# Patient Record
Sex: Male | Born: 1950 | Race: White | Hispanic: No | Marital: Married | Smoking: Never smoker
Health system: Southern US, Community
[De-identification: ages and names within clinical notes are randomized; demographics above are authoritative.]

## PROBLEM LIST (undated history)

## (undated) DIAGNOSIS — K4090 Unilateral inguinal hernia, without obstruction or gangrene, not specified as recurrent: Secondary | ICD-10-CM

## (undated) DIAGNOSIS — K429 Umbilical hernia without obstruction or gangrene: Secondary | ICD-10-CM

## (undated) DIAGNOSIS — J45909 Unspecified asthma, uncomplicated: Secondary | ICD-10-CM

## (undated) DIAGNOSIS — Z86010 Personal history of colonic polyps: Secondary | ICD-10-CM

## (undated) DIAGNOSIS — K922 Gastrointestinal hemorrhage, unspecified: Secondary | ICD-10-CM

## (undated) DIAGNOSIS — E0590 Thyrotoxicosis, unspecified without thyrotoxic crisis or storm: Secondary | ICD-10-CM

## (undated) DIAGNOSIS — T7840XA Allergy, unspecified, initial encounter: Secondary | ICD-10-CM

## (undated) DIAGNOSIS — R9431 Abnormal electrocardiogram [ECG] [EKG]: Secondary | ICD-10-CM

## (undated) DIAGNOSIS — J309 Allergic rhinitis, unspecified: Secondary | ICD-10-CM

## (undated) DIAGNOSIS — E785 Hyperlipidemia, unspecified: Secondary | ICD-10-CM

## (undated) DIAGNOSIS — K559 Vascular disorder of intestine, unspecified: Secondary | ICD-10-CM

## (undated) DIAGNOSIS — I2699 Other pulmonary embolism without acute cor pulmonale: Secondary | ICD-10-CM

## (undated) DIAGNOSIS — I10 Essential (primary) hypertension: Secondary | ICD-10-CM

## (undated) DIAGNOSIS — D6851 Activated protein C resistance: Secondary | ICD-10-CM

## (undated) DIAGNOSIS — Z860101 Personal history of adenomatous and serrated colon polyps: Secondary | ICD-10-CM

## (undated) HISTORY — DX: Other pulmonary embolism without acute cor pulmonale: I26.99

## (undated) HISTORY — DX: Essential (primary) hypertension: I10

## (undated) HISTORY — DX: Gastrointestinal hemorrhage, unspecified: K92.2

## (undated) HISTORY — DX: Activated protein C resistance: D68.51

## (undated) HISTORY — PX: COLONOSCOPY: SHX174

## (undated) HISTORY — DX: Allergy, unspecified, initial encounter: T78.40XA

## (undated) HISTORY — DX: Unspecified asthma, uncomplicated: J45.909

## (undated) HISTORY — DX: Unilateral inguinal hernia, without obstruction or gangrene, not specified as recurrent: K40.90

## (undated) HISTORY — DX: Vascular disorder of intestine, unspecified: K55.9

## (undated) HISTORY — PX: ACHILLES TENDON REPAIR: SUR1153

## (undated) HISTORY — DX: Abnormal electrocardiogram (ECG) (EKG): R94.31

## (undated) HISTORY — DX: Umbilical hernia without obstruction or gangrene: K42.9

## (undated) HISTORY — DX: Hyperlipidemia, unspecified: E78.5

## (undated) HISTORY — DX: Allergic rhinitis, unspecified: J30.9

---

## 1998-10-21 ENCOUNTER — Emergency Department (HOSPITAL_COMMUNITY): Admission: EM | Admit: 1998-10-21 | Discharge: 1998-10-21 | Payer: Self-pay | Admitting: Emergency Medicine

## 1998-10-24 ENCOUNTER — Ambulatory Visit (HOSPITAL_BASED_OUTPATIENT_CLINIC_OR_DEPARTMENT_OTHER): Admission: RE | Admit: 1998-10-24 | Discharge: 1998-10-24 | Payer: Self-pay | Admitting: Orthopaedic Surgery

## 2001-04-26 ENCOUNTER — Ambulatory Visit (HOSPITAL_BASED_OUTPATIENT_CLINIC_OR_DEPARTMENT_OTHER): Admission: RE | Admit: 2001-04-26 | Discharge: 2001-04-26 | Payer: Self-pay | Admitting: *Deleted

## 2002-02-08 ENCOUNTER — Ambulatory Visit (HOSPITAL_BASED_OUTPATIENT_CLINIC_OR_DEPARTMENT_OTHER): Admission: RE | Admit: 2002-02-08 | Discharge: 2002-02-08 | Payer: Self-pay | Admitting: *Deleted

## 2002-02-08 ENCOUNTER — Encounter (INDEPENDENT_AMBULATORY_CARE_PROVIDER_SITE_OTHER): Payer: Self-pay

## 2004-01-25 ENCOUNTER — Emergency Department (HOSPITAL_COMMUNITY): Admission: EM | Admit: 2004-01-25 | Discharge: 2004-01-25 | Payer: Self-pay | Admitting: Emergency Medicine

## 2005-09-07 ENCOUNTER — Encounter: Admission: RE | Admit: 2005-09-07 | Discharge: 2005-09-07 | Payer: Self-pay | Admitting: Family Medicine

## 2005-09-08 ENCOUNTER — Encounter: Admission: RE | Admit: 2005-09-08 | Discharge: 2005-09-08 | Payer: Self-pay | Admitting: Family Medicine

## 2010-05-16 ENCOUNTER — Inpatient Hospital Stay (HOSPITAL_COMMUNITY): Admission: RE | Admit: 2010-05-16 | Discharge: 2010-05-17 | Payer: Self-pay | Admitting: Family Medicine

## 2010-05-18 ENCOUNTER — Telehealth: Payer: Self-pay | Admitting: Internal Medicine

## 2010-05-21 ENCOUNTER — Ambulatory Visit: Payer: Self-pay | Admitting: Internal Medicine

## 2010-05-21 DIAGNOSIS — I2699 Other pulmonary embolism without acute cor pulmonale: Secondary | ICD-10-CM

## 2010-05-21 DIAGNOSIS — J309 Allergic rhinitis, unspecified: Secondary | ICD-10-CM | POA: Insufficient documentation

## 2010-05-21 DIAGNOSIS — J45909 Unspecified asthma, uncomplicated: Secondary | ICD-10-CM

## 2010-05-21 DIAGNOSIS — J4520 Mild intermittent asthma, uncomplicated: Secondary | ICD-10-CM | POA: Insufficient documentation

## 2010-05-21 DIAGNOSIS — I10 Essential (primary) hypertension: Secondary | ICD-10-CM | POA: Insufficient documentation

## 2010-05-21 DIAGNOSIS — Z86711 Personal history of pulmonary embolism: Secondary | ICD-10-CM | POA: Insufficient documentation

## 2010-05-21 HISTORY — DX: Unspecified asthma, uncomplicated: J45.909

## 2010-05-21 HISTORY — DX: Essential (primary) hypertension: I10

## 2010-05-21 HISTORY — DX: Allergic rhinitis, unspecified: J30.9

## 2010-05-21 HISTORY — DX: Other pulmonary embolism without acute cor pulmonale: I26.99

## 2010-05-26 ENCOUNTER — Ambulatory Visit: Payer: Self-pay | Admitting: Internal Medicine

## 2010-06-01 ENCOUNTER — Ambulatory Visit: Payer: Self-pay | Admitting: Internal Medicine

## 2010-06-01 LAB — CONVERTED CEMR LAB

## 2010-06-15 ENCOUNTER — Ambulatory Visit: Payer: Self-pay | Admitting: Internal Medicine

## 2010-07-01 ENCOUNTER — Ambulatory Visit: Payer: Self-pay | Admitting: Internal Medicine

## 2010-07-02 ENCOUNTER — Encounter: Payer: Self-pay | Admitting: Internal Medicine

## 2010-07-03 ENCOUNTER — Encounter (INDEPENDENT_AMBULATORY_CARE_PROVIDER_SITE_OTHER): Payer: Self-pay | Admitting: *Deleted

## 2010-07-20 ENCOUNTER — Ambulatory Visit: Payer: Self-pay | Admitting: Internal Medicine

## 2010-07-24 ENCOUNTER — Ambulatory Visit: Payer: Self-pay | Admitting: Internal Medicine

## 2010-08-26 ENCOUNTER — Ambulatory Visit: Payer: Self-pay | Admitting: Internal Medicine

## 2010-08-26 LAB — CONVERTED CEMR LAB: INR: 2.2

## 2010-09-23 ENCOUNTER — Ambulatory Visit: Payer: Self-pay | Admitting: Internal Medicine

## 2010-09-23 LAB — CONVERTED CEMR LAB: INR: 2.6

## 2010-10-23 ENCOUNTER — Ambulatory Visit: Payer: Self-pay | Admitting: Internal Medicine

## 2010-11-17 ENCOUNTER — Encounter (INDEPENDENT_AMBULATORY_CARE_PROVIDER_SITE_OTHER): Payer: Self-pay | Admitting: *Deleted

## 2010-11-17 ENCOUNTER — Ambulatory Visit: Payer: Self-pay | Admitting: Internal Medicine

## 2010-11-17 LAB — CONVERTED CEMR LAB: INR: 2.1

## 2010-12-04 ENCOUNTER — Telehealth: Payer: Self-pay | Admitting: Internal Medicine

## 2010-12-04 ENCOUNTER — Telehealth (INDEPENDENT_AMBULATORY_CARE_PROVIDER_SITE_OTHER): Payer: Self-pay | Admitting: *Deleted

## 2010-12-08 ENCOUNTER — Encounter (INDEPENDENT_AMBULATORY_CARE_PROVIDER_SITE_OTHER): Payer: Self-pay | Admitting: *Deleted

## 2010-12-10 ENCOUNTER — Ambulatory Visit: Payer: Self-pay | Admitting: Internal Medicine

## 2010-12-31 ENCOUNTER — Ambulatory Visit
Admission: RE | Admit: 2010-12-31 | Discharge: 2010-12-31 | Payer: Self-pay | Source: Home / Self Care | Attending: Internal Medicine | Admitting: Internal Medicine

## 2010-12-31 ENCOUNTER — Encounter: Payer: Self-pay | Admitting: Internal Medicine

## 2011-01-02 ENCOUNTER — Encounter: Payer: Self-pay | Admitting: Internal Medicine

## 2011-01-28 NOTE — Assessment & Plan Note (Signed)
Summary: CONSULTATION CONCERNING COUMADIN THERAPY // RS   Vital Signs:  Patient profile:   60 year old male Weight:      229 pounds Temp:     97.6 degrees F oral BP sitting:   132 / 90  (right arm) Cuff size:   regular  Vitals Entered By: Duard Brady LPN (July 24, 2010 8:05 AM) CC: needs to discuss up coming dental procedure and colo to be done - coumadin tx Is Patient Diabetic? No   CC:  needs to discuss up coming dental procedure and colo to be done - coumadin tx.  History of Present Illness: 59 year old patient who has a history of pulmonary embolism in May of this year.  He has a history of factor V Leiden deficiency.  He needs elective wisdom teeth, extraction, as well as screening colonoscopy.  Options were discussed, and he was elected to hold off the screening procedures until he has completed 6 months of Coumadin.  He has hypertension, which has been well controlled  Preventive Screening-Counseling & Management  Alcohol-Tobacco     Smoking Status: never  Allergies (verified): No Known Drug Allergies  Review of Systems       The patient complains of peripheral edema.  The patient denies anorexia, fever, weight loss, weight gain, vision loss, decreased hearing, hoarseness, chest pain, syncope, dyspnea on exertion, prolonged cough, headaches, hemoptysis, abdominal pain, melena, hematochezia, severe indigestion/heartburn, hematuria, incontinence, genital sores, muscle weakness, suspicious skin lesions, transient blindness, difficulty walking, depression, unusual weight change, abnormal bleeding, enlarged lymph nodes, angioedema, breast masses, and testicular masses.    Physical Exam  General:  overweight-appearing.  130/84 Head:  Normocephalic and atraumatic without obvious abnormalities. No apparent alopecia or balding. Mouth:  Oral mucosa and oropharynx without lesions or exudates.  Teeth in good repair. Neck:  No deformities, masses, or tenderness noted. Lungs:   Normal respiratory effort, chest expands symmetrically. Lungs are clear to auscultation, no crackles or wheezes. Heart:  Normal rate and regular rhythm. S1 and S2 normal without gallop, murmur, click, rub or other extra sounds. Abdomen:  Bowel sounds positive,abdomen soft and non-tender without masses, organomegaly or hernias noted. Msk:  No deformity or scoliosis noted of thoracic or lumbar spine.   Pulses:  R and L carotid,radial,femoral,dorsalis pedis and posterior tibial pulses are full and equal bilaterally Extremities:  No clubbing, cyanosis, edema, or deformity noted with normal full range of motion of all joints.     Impression & Recommendations:  Problem # 1:  COUMADIN THERAPY (ICD-V58.61) will hold off on colonoscopy, wisdom tooth extraction until off Coumadin in 4 months.  Will set up for a screening colonoscopy at that time  Problem # 2:  PULMONARY EMBOLISM (ICD-415.19)  His updated medication list for this problem includes:    Lovenox 120 Mg/0.74ml Soln (Enoxaparin sodium) .Marland KitchenMarland KitchenMarland KitchenMarland Kitchen 105 mg - 0.37ml    bid    Coumadin 7.5 Mg Tabs (Warfarin sodium) ..... Qd    Coumadin 5 Mg Tabs (Warfarin sodium) ..... One daily  Problem # 3:  HYPERTENSION (ICD-401.9)  His updated medication list for this problem includes:    Ramipril 10 Mg Caps (Ramipril) ..... Qd    Amlodipine Besylate 5 Mg Tabs (Amlodipine besylate) ..... Qd  Complete Medication List: 1)  Ramipril 10 Mg Caps (Ramipril) .... Qd 2)  Amlodipine Besylate 5 Mg Tabs (Amlodipine besylate) .... Qd 3)  Doxycycline Hyclate 100 Mg Caps (Doxycycline hyclate) .... Qd 4)  Singulair 10 Mg Tabs (Montelukast sodium) .... Qd 5)  Ventolin Hfa 108 (90 Base) Mcg/act Aers (Albuterol sulfate) .... Prn 6)  Lovenox 120 Mg/0.12ml Soln (Enoxaparin sodium) .Marland Kitchen.. 105 mg - 0.19ml    bid 7)  Coumadin 7.5 Mg Tabs (Warfarin sodium) .... Qd 8)  Caltrate 600+d 600-400 Mg-unit Tabs (Calcium carbonate-vitamin d) .... Qd 9)  Daily Multi Tabs (Multiple  vitamins-minerals) .... Qd 10)  Fish Oil 1000 Mg Caps (Omega-3 fatty acids) .... Qd 11)  Coumadin 5 Mg Tabs (Warfarin sodium) .... One daily  Patient Instructions: 1)  Please schedule a follow-up appointment in 4 months. 2)  Limit your Sodium (Salt) to less than 2 grams a day(slightly less than 1/2 a teaspoon) to prevent fluid retention, swelling, or worsening of symptoms. 3)  It is important that you exercise regularly at least 20 minutes 5 times a week. If you develop chest pain, have severe difficulty breathing, or feel very tired , stop exercising immediately and seek medical attention. 4)  You need to lose weight. Consider a lower calorie diet and regular exercise.  5)  Check your Blood Pressure regularly. If it is above: 150/90  you should make an appointment.

## 2011-01-28 NOTE — Assessment & Plan Note (Signed)
Summary: pt/inr//ccm  Nurse Visit   Allergies: No Known Drug Allergies Laboratory Results   Blood Tests   Date/Time Received: June 01, 2010 11:55 AM  Date/Time Reported: June 01, 2010 11:55 AM    INR: 3.3   (Normal Range: 0.88-1.12   Therap INR: 2.0-3.5) Comments: Wynona Canes, CMA  June 01, 2010 11:55 AM     Orders Added: 1)  Est. Patient Level I [99211] 2)  Protime [16109UE]  Laboratory Results   Blood Tests      INR: 3.3   (Normal Range: 0.88-1.12   Therap INR: 2.0-3.5) Comments: Wynona Canes, CMA  June 01, 2010 11:55 AM       ANTICOAGULATION RECORD PREVIOUS REGIMEN & LAB RESULTS Anticoagulation Diagnosis:  V58.83,V58.61,415.19 on  05/21/2010 Previous INR Goal Range:  2.0-3.0 on  05/21/2010 Previous INR:  2.7 on  05/26/2010 Previous Coumadin Dose(mg):  7.5mg  qd on  05/21/2010 Previous Regimen:  same on  05/26/2010  NEW REGIMEN & LAB RESULTS Current INR Goal Range: 2.0-3.0 Current INR: 3.3 Regimen: Hold for 1 day then 5mg  on mon & thu 7.5mg  on other days       Repeat testing in: 2 weeks MEDICATIONS RAMIPRIL 10 MG CAPS (RAMIPRIL) qd AMLODIPINE BESYLATE 5 MG TABS (AMLODIPINE BESYLATE) qd DOXYCYCLINE HYCLATE 100 MG CAPS (DOXYCYCLINE HYCLATE) qd SINGULAIR 10 MG TABS (MONTELUKAST SODIUM) qd VENTOLIN HFA 108 (90 BASE) MCG/ACT AERS (ALBUTEROL SULFATE) prn LOVENOX 120 MG/0.8ML SOLN (ENOXAPARIN SODIUM) 105 mg - 0.80ml    bid COUMADIN 7.5 MG TABS (WARFARIN SODIUM) qd CALTRATE 600+D 600-400 MG-UNIT TABS (CALCIUM CARBONATE-VITAMIN D) qd DAILY MULTI  TABS (MULTIPLE VITAMINS-MINERALS) qd FISH OIL 1000 MG CAPS (OMEGA-3 FATTY ACIDS) qd COUMADIN 5 MG TABS (WARFARIN SODIUM) one daily   Anticoagulation Visit Questionnaire      Coumadin dose missed/changed:  No      Abnormal Bleeding Symptoms:  No   Any diet changes including alcohol intake, vegetables or greens since the last visit:  No Any illnesses or hospitalizations since the last visit:  No Any  signs of clotting since the last visit (including chest discomfort, dizziness, shortness of breath, arm tingling, slurred speech, swelling or redness in leg):  No

## 2011-01-28 NOTE — Progress Notes (Signed)
Summary: Medication List provided by patient  Medication List provided by patient   Imported By: Maryln Gottron 05/27/2010 15:21:52  _____________________________________________________________________  External Attachment:    Type:   Image     Comment:   External Document

## 2011-01-28 NOTE — Procedures (Signed)
Summary: Colonoscopy  Patient: Matthew Trujillo Note: All result statuses are Final unless otherwise noted.  Tests: (1) Colonoscopy (COL)   COL Colonoscopy           DONE     [facility]     [address]     [place], [place]  [address]           COLONOSCOPY PROCEDURE REPORT           PATIENT:  Matthew Trujillo, Matthew Trujillo  MR#:  [id number]     BIRTHDATE:  04-Nov-1951, 59 yrs. old  GENDER:  male     ENDOSCOPIST:  Wilhemina Bonito. Eda Keys, MD     REF. BY:  .Direct / Self     PROCEDURE DATE:  12/31/2010     PROCEDURE:  Colonoscopy with snare polypectomy x 1     ASA CLASS:  Class II     INDICATIONS:  Routine Risk Screening     MEDICATIONS:   Fentanyl 100 mcg IV, Versed 10 mg IV           DESCRIPTION OF PROCEDURE:   After the risks benefits and     alternatives of the procedure were thoroughly explained, informed     consent was obtained.  Digital rectal exam was performed and     revealed no abnormalities.   The LB [id number] endoscope     was introduced through the anus and advanced to the cecum, which     was identified by both the appendix and ileocecal valve, without     limitations.Time to cecum = 2:41 min.  The quality of the prep was     excellent, using MoviPrep.  The instrument was then slowly     withdrawn (time = 13:36 min) as the colon was fully examined.     <<PROCEDUREIMAGES>>           FINDINGS:  A diminutive polyp was found in the cecum. Polyp was     snared without cautery. Retrieval was successful.   Otherwise     normal colonoscopy without other polyps, masses, vascular     ectasias, or inflammatory changes.   Retroflexed views in the     rectum revealed no abnormalities.    The scope was then withdrawn     from the patient and the procedure completed.           COMPLICATIONS:  None     ENDOSCOPIC IMPRESSION:     1) Diminutive polyp in the cecum - removed     2) Otherwise normal colonoscopy           RECOMMENDATIONS:     1) Repeat colonoscopy in 5 years if polyp  adenomatous; otherwise     10 years           ______________________________     Wilhemina Bonito. Eda Keys, MD           CC:  Gordy Savers, MD; The Patient           n.     eSIGNED:   Wilhemina Bonito. Eda Keys at 12/31/2010 12:12 PM           Sheliah Mends, [id number]  Note: An exclamation mark (!) indicates a result that was not dispersed into the flowsheet. Document Creation Date: 12/31/2010 12:13 PM _______________________________________________________________________  (1) Order result status: Final Collection or observation date-time: 12/31/2010 12:07 Requested date-time:  Receipt date-time:  Reported date-time:  Referring Physician:   Ordering  Physician: Fransico Setters [id number]) Specimen Source:  Source: Launa Grill Order Number: [id number] Lab site:   Appended Document: Colonoscopy recall 5 yrs     Procedures Next Due Date:    Colonoscopy: 12/2015

## 2011-01-28 NOTE — Letter (Signed)
Summary: Request for Surgical Clearance/[facility]  Request for Surgical Clearance/[facility]   Imported By: Maryln Gottron 07/13/2010 14:27:03  _____________________________________________________________________  External Attachment:    Type:   Image     Comment:   External Document

## 2011-01-28 NOTE — Assessment & Plan Note (Signed)
Summary: BRAND NEW PT/TO EST/CJR   Vital Signs:  Patient profile:   60 year old male Height:      73.25 inches Weight:      230 pounds BMI:     30.25 Temp:     98.0 degrees F oral BP sitting:   160 / 90  (left arm) Cuff size:   regular  Vitals Entered By: Duard Brady LPN (May 21, 2010 2:41 PM) CC: new to establish - post hospital Is Patient Diabetic? No   CC:  new to establish - post hospital.  History of Present Illness:  60 year old patient recently discharged for bilateral small pulmonary emboli.  He has a history of factor V Leiden deficiency and a son, who is on chronic Coumadin due to factor V deficiency and recurrent thromboemboli.  He has done well since his hospital discharge and is on Lovenox 110 mg twice daily.  He is on Coumadin 7.5 mg daily.  Denies any pulmonary complaints  Preventive Screening-Counseling & Management  Alcohol-Tobacco     Smoking Status: never  Allergies (verified): No Known Drug Allergies  Past History:  Past Medical History: history of pulmonary emboli, May 2011 factor V Leiden deficiency Asthma Hypertension Allergic rhinitis  Past Surgical History: Achilles' tendon repair in 2000  Family History: Reviewed history and no changes required. positive for coronary artery disease, and hypertension one brother died of pancreatic cancer father died of renal cancer  Social History: Reviewed history and no changes required. Ph.D., Emergency planning/management officer for [employer] Married Never Smoked Smoking Status:  never  Review of Systems  The patient denies anorexia, fever, weight loss, weight gain, vision loss, decreased hearing, hoarseness, chest pain, syncope, dyspnea on exertion, peripheral edema, prolonged cough, headaches, hemoptysis, abdominal pain, melena, hematochezia, severe indigestion/heartburn, hematuria, incontinence, genital sores, muscle weakness, suspicious skin lesions, transient blindness, difficulty walking, depression,  unusual weight change, abnormal bleeding, enlarged lymph nodes, angioedema, breast masses, and testicular masses.    Physical Exam  General:  overweight-appearing.  140/90overweight-appearing.   Head:  Normocephalic and atraumatic without obvious abnormalities. No apparent alopecia or balding. Eyes:  No corneal or conjunctival inflammation noted. EOMI. Perrla. Funduscopic exam benign, without hemorrhages, exudates or papilledema. Vision grossly normal. Ears:  External ear exam shows no significant lesions or deformities.  Otoscopic examination reveals clear canals, tympanic membranes are intact bilaterally without bulging, retraction, inflammation or discharge. Hearing is grossly normal bilaterally. Nose:  External nasal examination shows no deformity or inflammation. Nasal mucosa are pink and moist without lesions or exudates. Mouth:  Oral mucosa and oropharynx without lesions or exudates.  Teeth in good repair. Neck:  No deformities, masses, or tenderness noted. Chest Wall:  No deformities, masses, tenderness or gynecomastia noted. Breasts:  No masses or gynecomastia noted Lungs:  Normal respiratory effort, chest expands symmetrically. Lungs are clear to auscultation, no crackles or wheezes. Heart:  Normal rate and regular rhythm. S1 and S2 normal without gallop, murmur, click, rub or other extra sounds. Abdomen:  Bowel sounds positive,abdomen soft and non-tender without masses, organomegaly or hernias noted. Msk:  No deformity or scoliosis noted of thoracic or lumbar spine.   Pulses:  R and L carotid,radial,femoral,dorsalis pedis and posterior tibial pulses are full and equal bilaterally Extremities:  No clubbing, cyanosis, edema, or deformity noted with normal full range of motion of all joints.   Skin:  Intact without suspicious lesions or rashes Cervical Nodes:  No lymphadenopathy noted Axillary Nodes:  No palpable lymphadenopathy Inguinal Nodes:  No significant adenopathy  Impression  & Recommendations:  Problem # 1:  Preventive Health Care (ICD-V70.0)  Complete Medication List: 1)  Ramipril 10 Mg Caps (Ramipril) .... Qd 2)  Amlodipine Besylate 5 Mg Tabs (Amlodipine besylate) .... Qd 3)  Doxycycline Hyclate 100 Mg Caps (Doxycycline hyclate) .... Qd 4)  Singulair 10 Mg Tabs (Montelukast sodium) .... Qd 5)  Ventolin Hfa 108 (90 Base) Mcg/act Aers (Albuterol sulfate) .... Prn 6)  Lovenox 120 Mg/0.95ml Soln (Enoxaparin sodium) .Marland Kitchen.. 105 mg - 0.35ml    bid 7)  Coumadin 7.5 Mg Tabs (Warfarin sodium) .... Qd 8)  Caltrate 600+d 600-400 Mg-unit Tabs (Calcium carbonate-vitamin d) .... Qd 9)  Daily Multi Tabs (Multiple vitamins-minerals) .... Qd 10)  Fish Oil 1000 Mg Caps (Omega-3 fatty acids) .... Qd 11)  Coumadin 5 Mg Tabs (Warfarin sodium) .... One daily  Other Orders: Protime ([id number])  Patient Instructions: 1)  Limit your Sodium (Salt) to less than 2 grams a day(slightly less than 1/2 a teaspoon) to prevent fluid retention, swelling, or worsening of symptoms. 2)  It is important that you exercise regularly at least 20 minutes 5 times a week. If you develop chest pain, have severe difficulty breathing, or feel very tired , stop exercising immediately and seek medical attention. 3)  You need to lose weight. Consider a lower calorie diet and regular exercise.  4)  Check your Blood Pressure regularly. If it is above: 150/90 you should make an appointment. Prescriptions: COUMADIN 5 MG TABS (WARFARIN SODIUM) one daily  #30 x 0   Entered and Authorized by:   Gordy Savers  MD   Signed by:   Gordy Savers  MD on 05/21/2010   Method used:   Electronically to        [id number] (retail)       [address]       [place], [place]  [address]       Ph: [phone] or [phone]       Fax: [phone]   RxID:   [id number] COUMADIN 7.5 MG TABS (WARFARIN SODIUM) qd  #30 x 0   Entered and Authorized by:   Gordy Savers  MD   Signed by:   Gordy Savers  MD on 05/21/2010   Method used:   Electronically to        [facility] (retail)       [address]       [place], [place]  [address]       Ph: [phone] or [phone]       Fax: [phone]   RxID:   [id number] VENTOLIN HFA 108 (90 BASE) MCG/ACT AERS (ALBUTEROL SULFATE) prn  #1 x 6   Entered and Authorized by:   Gordy Savers  MD   Signed by:   Gordy Savers  MD on 05/21/2010   Method used:   Electronically to        [facility] (retail)       [address]       [place], [place]  [address]       Ph: [phone] or [phone]       Fax: [phone]   RxID:   [id number] SINGULAIR 10 MG TABS (MONTELUKAST SODIUM) qd  #90 x 6   Entered and Authorized by:   Gordy Savers  MD   Signed by:   Gordy Savers  MD on 05/21/2010   Method used:   Electronically to  [facility] (retail)       [address]       [place], [place]  [address]       Ph: [phone] or [phone]       Fax: [phone]   RxID:   [id number] AMLODIPINE BESYLATE 5 MG TABS (AMLODIPINE BESYLATE) qd  #90 x 6   Entered and Authorized by:   Gordy Savers  MD   Signed by:   Gordy Savers  MD on 05/21/2010   Method used:   Electronically to        [facility] (retail)       [address]       [place], [place]  [address]       Ph: [phone] or [phone]       Fax: [phone]   RxID:   [id number] RAMIPRIL 10 MG CAPS (RAMIPRIL) qd  #90 x 6   Entered and Authorized by:   Gordy Savers  MD   Signed by:   Gordy Savers  MD on 05/21/2010   Method used:   Electronically to        [facility] (retail)       [address]       [place], [place]  [address]       Ph: [phone] or [phone]       Fax: [phone]   RxID:   [id number]   Appended Document: Orders Update    Clinical Lists Changes  Observations: Added new observation of ABNORM BLEED: No (05/21/2010 16:56) Added new  observation of COUMADIN CHG: No (05/21/2010 16:56) Added new observation of NEXT PT: Tuesday (05/21/2010 16:56) Added new observation of CUR. REGIMEN: 7.5mg  qd (05/21/2010 16:56) Added new observation of COUM TAB MG: 7.5mg  qd (05/21/2010 16:56) Added new observation of INR RANGE: 2.0-3.0 (05/21/2010 16:56) Added new observation of DIAGNOSIS C1: V58.83,V58.61,415.19 (05/21/2010 16:56) Added new observation of COMMENTS2: Wynona Canes, CMA  May 21, 2010 4:56 PM  (05/21/2010 16:56) Added new observation of INR: 1.5  (05/21/2010 16:56) Added new observation of PT PATIENT: 14.0 s (05/21/2010 16:56)      Laboratory Results   Blood Tests   Date/Time Recieved: May 21, 2010 4:56 PM  Date/Time Reported: May 21, 2010 4:56 PM   PT: 14.0 s   (Normal Range: 10.6-13.4)  INR: 1.5   (Normal Range: 0.88-1.12   Therap INR: 2.0-3.5) Comments: Wynona Canes, CMA  May 21, 2010 4:56 PM       ANTICOAGULATION RECORD       NEW REGIMEN & LAB RESULTS Anticoag. Dx: V58.83,V58.61,415.19 Current INR Goal Range: 2.0-3.0 Current INR: 1.5 Current Coumadin Dose(mg): 7.5mg  qd Regimen: 7.5mg  qd       Repeat testing in: Tuesday MEDICATIONS RAMIPRIL 10 MG CAPS (RAMIPRIL) qd AMLODIPINE BESYLATE 5 MG TABS (AMLODIPINE BESYLATE) qd DOXYCYCLINE HYCLATE 100 MG CAPS (DOXYCYCLINE HYCLATE) qd SINGULAIR 10 MG TABS (MONTELUKAST SODIUM) qd VENTOLIN HFA 108 (90 BASE) MCG/ACT AERS (ALBUTEROL SULFATE) prn LOVENOX 120 MG/0.8ML SOLN (ENOXAPARIN SODIUM) 105 mg - 0.52ml    bid COUMADIN 7.5 MG TABS (WARFARIN SODIUM) qd CALTRATE 600+D 600-400 MG-UNIT TABS (CALCIUM CARBONATE-VITAMIN D) qd DAILY MULTI  TABS (MULTIPLE VITAMINS-MINERALS) qd FISH OIL 1000 MG CAPS (OMEGA-3 FATTY ACIDS) qd COUMADIN 5 MG TABS (WARFARIN SODIUM) one daily   Anticoagulation Visit Questionnaire      Coumadin dose missed/changed:  No      Abnormal Bleeding Symptoms:  No   Any diet changes  including alcohol intake, vegetables or greens  since the last visit:  No Any illnesses or hospitalizations since the last visit:  No Any signs of clotting since the last visit (including chest discomfort, dizziness, shortness of breath, arm tingling, slurred speech, swelling or redness in leg):  No

## 2011-01-28 NOTE — Assessment & Plan Note (Signed)
Summary: pt/njr/pt rescd//ccm  Nurse Visit   Allergies: No Known Drug Allergies Laboratory Results   Blood Tests   Date/Time Received: October 23, 2010 11:41 AM  Date/Time Reported: October 23, 2010 11:41 AM    INR: 1.9   (Normal Range: 0.88-1.12   Therap INR: 2.0-3.5) Comments: Wynona Canes, CMA  October 23, 2010 11:41 AM     Orders Added: 1)  Est. Patient Level I [99211] 2)  Protime [16109UE]  Laboratory Results   Blood Tests      INR: 1.9   (Normal Range: 0.88-1.12   Therap INR: 2.0-3.5) Comments: Wynona Canes, CMA  October 23, 2010 11:41 AM       ANTICOAGULATION RECORD PREVIOUS REGIMEN & LAB RESULTS Anticoagulation Diagnosis:  V58.83,V58.61,415.19 on  05/21/2010 Previous INR Goal Range:  2.0-3.0 on  06/01/2010 Previous INR:  2.6 on  09/23/2010 Previous Coumadin Dose(mg):  5mg  only on sun 7.5mg  on other days on  08/26/2010 Previous Regimen:  Same Dose on  09/23/2010  NEW REGIMEN & LAB RESULTS Current INR: 1.9 Regimen: Same Dose  (no change)       Repeat testing in: 4 weeks MEDICATIONS RAMIPRIL 10 MG CAPS (RAMIPRIL) qd AMLODIPINE BESYLATE 5 MG TABS (AMLODIPINE BESYLATE) qd DOXYCYCLINE HYCLATE 100 MG CAPS (DOXYCYCLINE HYCLATE) qd SINGULAIR 10 MG TABS (MONTELUKAST SODIUM) qd VENTOLIN HFA 108 (90 BASE) MCG/ACT AERS (ALBUTEROL SULFATE) prn LOVENOX 120 MG/0.8ML SOLN (ENOXAPARIN SODIUM) 105 mg - 0.83ml    bid COUMADIN 7.5 MG TABS (WARFARIN SODIUM) qd CALTRATE 600+D 600-400 MG-UNIT TABS (CALCIUM CARBONATE-VITAMIN D) qd DAILY MULTI  TABS (MULTIPLE VITAMINS-MINERALS) qd FISH OIL 1000 MG CAPS (OMEGA-3 FATTY ACIDS) qd COUMADIN 5 MG TABS (WARFARIN SODIUM) one daily   Anticoagulation Visit Questionnaire      Coumadin dose missed/changed:  No      Abnormal Bleeding Symptoms:  No   Any diet changes including alcohol intake, vegetables or greens since the last visit:  No Any illnesses or hospitalizations since the last visit:  No Any signs of clotting  since the last visit (including chest discomfort, dizziness, shortness of breath, arm tingling, slurred speech, swelling or redness in leg):  No

## 2011-01-28 NOTE — Assessment & Plan Note (Signed)
Summary: PT/NJR  Nurse Visit   Allergies: No Known Drug Allergies Laboratory Results   Blood Tests   Date/Time Received: September 23, 2010 11:17 AM  Date/Time Reported: September 23, 2010 11:16 AM    INR: 2.6   (Normal Range: 0.88-1.12   Therap INR: 2.0-3.5) Comments: Wynona Canes, CMA  September 23, 2010 11:17 AM     Orders Added: 1)  Est. Patient Level I [99211] 2)  Fingerstick [04540]  Laboratory Results   Blood Tests      INR: 2.6   (Normal Range: 0.88-1.12   Therap INR: 2.0-3.5) Comments: Wynona Canes, CMA  September 23, 2010 11:17 AM       ANTICOAGULATION RECORD PREVIOUS REGIMEN & LAB RESULTS Anticoagulation Diagnosis:  V58.83,V58.61,415.19 on  05/21/2010 Previous INR Goal Range:  2.0-3.0 on  06/01/2010 Previous INR:  2.2 on  08/26/2010 Previous Coumadin Dose(mg):  5mg  only on sun 7.5mg  on other days on  08/26/2010 Previous Regimen:  5mg . Sun. only 7.5mg . all other days on  07/20/2010  NEW REGIMEN & LAB RESULTS Current INR: 2.6 Regimen: Same Dose       Repeat testing in: 4 weeks MEDICATIONS RAMIPRIL 10 MG CAPS (RAMIPRIL) qd AMLODIPINE BESYLATE 5 MG TABS (AMLODIPINE BESYLATE) qd DOXYCYCLINE HYCLATE 100 MG CAPS (DOXYCYCLINE HYCLATE) qd SINGULAIR 10 MG TABS (MONTELUKAST SODIUM) qd VENTOLIN HFA 108 (90 BASE) MCG/ACT AERS (ALBUTEROL SULFATE) prn LOVENOX 120 MG/0.8ML SOLN (ENOXAPARIN SODIUM) 105 mg - 0.68ml    bid COUMADIN 7.5 MG TABS (WARFARIN SODIUM) qd CALTRATE 600+D 600-400 MG-UNIT TABS (CALCIUM CARBONATE-VITAMIN D) qd DAILY MULTI  TABS (MULTIPLE VITAMINS-MINERALS) qd FISH OIL 1000 MG CAPS (OMEGA-3 FATTY ACIDS) qd COUMADIN 5 MG TABS (WARFARIN SODIUM) one daily   Anticoagulation Visit Questionnaire      Coumadin dose missed/changed:  No      Abnormal Bleeding Symptoms:  No   Any diet changes including alcohol intake, vegetables or greens since the last visit:  No Any illnesses or hospitalizations since the last visit:  No Any signs of  clotting since the last visit (including chest discomfort, dizziness, shortness of breath, arm tingling, slurred speech, swelling or redness in leg):  No

## 2011-01-28 NOTE — Assessment & Plan Note (Signed)
Summary: pt//ccm  Nurse Visit   Allergies: No Known Drug Allergies Laboratory Results   Blood Tests      INR: 1.8   (Normal Range: 0.88-1.12   Therap INR: 2.0-3.5) Comments: Rita Ohara  July 20, 2010 11:19 AM     Orders Added: 1)  Est. Patient Level I [99211] 2)  Protime [[id number]]   ANTICOAGULATION RECORD PREVIOUS REGIMEN & LAB RESULTS Anticoagulation Diagnosis:  V58.83,V58.61,415.19 on  05/21/2010 Previous INR Goal Range:  2.0-3.0 on  06/01/2010 Previous INR:  1.9 on  07/01/2010 Previous Coumadin Dose(mg):  5mg  on mon & thu 7.5mg  on other days on  07/01/2010 Previous Regimen:  same on  06/15/2010  NEW REGIMEN & LAB RESULTS Current INR: 1.8 Regimen: 5mg . Sun. only 7.5mg . all other days  Repeat testing in: 4 weeks  Anticoagulation Visit Questionnaire Coumadin dose missed/changed:  No Abnormal Bleeding Symptoms:  No  Any diet changes including alcohol intake, vegetables or greens since the last visit:  No Any illnesses or hospitalizations since the last visit:  No Any signs of clotting since the last visit (including chest discomfort, dizziness, shortness of breath, arm tingling, slurred speech, swelling or redness in leg):  No  MEDICATIONS RAMIPRIL 10 MG CAPS (RAMIPRIL) qd AMLODIPINE BESYLATE 5 MG TABS (AMLODIPINE BESYLATE) qd DOXYCYCLINE HYCLATE 100 MG CAPS (DOXYCYCLINE HYCLATE) qd SINGULAIR 10 MG TABS (MONTELUKAST SODIUM) qd VENTOLIN HFA 108 (90 BASE) MCG/ACT AERS (ALBUTEROL SULFATE) prn LOVENOX 120 MG/0.8ML SOLN (ENOXAPARIN SODIUM) 105 mg - 0.71ml    bid COUMADIN 7.5 MG TABS (WARFARIN SODIUM) qd CALTRATE 600+D 600-400 MG-UNIT TABS (CALCIUM CARBONATE-VITAMIN D) qd DAILY MULTI  TABS (MULTIPLE VITAMINS-MINERALS) qd FISH OIL 1000 MG CAPS (OMEGA-3 FATTY ACIDS) qd COUMADIN 5 MG TABS (WARFARIN SODIUM) one daily

## 2011-01-28 NOTE — Progress Notes (Signed)
Summary: PT/INR f/u past hosp.   Phone Note From Other Clinic   Caller: mary   - [facility] - Summary of Call: calling about Pt/inr - seen in hospital over weekend - no coumadon since saturday , on lovenox  current pt 10.6  INR 1.0, please advise. KIK Initial call taken by: Duard Brady LPN,  May 18, 2010 2:56 PM  Follow-up for Phone Call        per Dr. Amador Cunas - counadin 7.5 mg daily and to see in our office Thursday  - called mary [facility] and gave orders - appt made. KIK Follow-up by: Duard Brady LPN,  May 18, 2010 2:57 PM

## 2011-01-28 NOTE — Progress Notes (Signed)
Summary: Pt. on coumadin  Phone Note Call from Patient   Details for Reason: Pt. on coumadin Details of Complaint: needs office visit prior to scheduling colon Summary of Call: Pt. was on schedule for PV and Colon.  On reviewing his chart I saw he is on coumadin.  PV and colon cancelled.  Messages left by [facility] early this morning and by me this afternoon explaining why he needs an OV and that his procedure has been cx'd and his PV. Initial call taken by: Wyona Almas RN,  December 04, 2010 2:52 PM

## 2011-01-28 NOTE — Letter (Signed)
Summary: New Patient letter  [facility]  [address] [place], [place] [address]   Phone: [phone]  Fax: [phone]       07/03/2010 MRN: [id number]  [facility] [address] [place], [place]  [address]  Dear Mr. Giel,  Welcome to the Gastroenterology Division at [facility].    You are scheduled to see Dr.  Marina Goodell on 08-18-10 at 11:00a.m. on the 3rd floor at [facility], [address].  We ask that you try to arrive at our office 15 minutes prior to your appointment time to allow for check-in.  We would like you to complete the enclosed self-administered evaluation form prior to your visit and bring it with you on the day of your appointment.  We will review it with you.  Also, please bring a complete list of all your medications or, if you prefer, bring the medication bottles and we will list them.  Please bring your insurance card so that we may make a copy of it.  If your insurance requires a referral to see a specialist, please bring your referral form from your primary care physician.  Co-payments are due at the time of your visit and may be paid by cash, check or credit card.     Your office visit will consist of a consult with your physician (includes a physical exam), any laboratory testing he/she may order, scheduling of any necessary diagnostic testing (e.g. x-ray, ultrasound, CT-scan), and scheduling of a procedure (e.g. Endoscopy, Colonoscopy) if required.  Please allow enough time on your schedule to allow for any/all of these possibilities.    If you cannot keep your appointment, please call [phone] to cancel or reschedule prior to your appointment date.  This allows [place] the opportunity to schedule an appointment for another patient in need of care.  If you do not cancel or reschedule by 5 p.m. the business day prior to your appointment date, you will be charged a $50.00 late cancellation/no-show fee.    Thank you for  choosing [facility] for your medical needs.  We appreciate the opportunity to care for you.  Please visit [place] at our website  to learn more about our practice.                     Sincerely,                                                             The Gastroenterology Division

## 2011-01-28 NOTE — Letter (Signed)
Summary: Pre Visit Letter Revised  [facility]  [address] [place], [place] [address]   Phone: [phone]  Fax: [phone]        11/17/2010 MRN: [id number] [facility] [address] [place], [place]  [address]             Procedure Date:  12-31-10   Welcome to the Gastroenterology Division at [facility].    You are scheduled to see a nurse for your pre-procedure visit on 12-10-10 at 4:30p.m. on the 3rd floor at [facility], [address].  We ask that you try to arrive at our office 15 minutes prior to your appointment time to allow for check-in.  Please take a minute to review the attached form.  If you answer "Yes" to one or more of the questions on the first page, we ask that you call the person listed at your earliest opportunity.  If you answer "No" to all of the questions, please complete the rest of the form and bring it to your appointment.    Your nurse visit will consist of discussing your medical and surgical history, your immediate family medical history, and your medications.   If you are unable to list all of your medications on the form, please bring the medication bottles to your appointment and we will list them.  We will need to be aware of both prescribed and over the counter drugs.  We will need to know exact dosage information as well.    Please be prepared to read and sign documents such as consent forms, a financial agreement, and acknowledgement forms.  If necessary, and with your consent, a friend or relative is welcome to sit-in on the nurse visit with you.  Please bring your insurance card so that we may make a copy of it.  If your insurance requires a referral to see a specialist, please bring your referral form from your primary care physician.  No co-pay is required for this nurse visit.     If you cannot keep your appointment, please call [phone] to cancel or reschedule prior to your appointment date.  This  allows [place] the opportunity to schedule an appointment for another patient in need of care.    Thank you for choosing [facility] for your medical needs.  We appreciate the opportunity to care for you.  Please visit [place] at our website  to learn more about our practice.  Sincerely, The Gastroenterology Division

## 2011-01-28 NOTE — Progress Notes (Signed)
Summary: Pt. no longer on coumadin or lovenox  Phone Note Call from Patient Call back at [phone]   Caller: Patient Call For: Dr. Marina Goodell Reason for Call: Talk to Doctor Summary of Call: Pt wants [place] to update his information in the computer, says that his procedure was canceled becuase he is on cumadin but he says that he no longer takes blood thiners  Initial call taken by: [place] Johnson,  December 04, 2010 4:07 PM  Follow-up for Phone Call        I spoke with pt. and he says he is no longer on coumadin or lovenox.  I advised he [facility] his PV and colon.  I explained why he'd been canceled due to our policy of needing an office visit if on above meds.  He verbalized understanding.  I also apologized for not getting back to him Friday afternoon.  I was off for low census and the phone note had been put on my desktop instead of the previsit desk top. Follow-up by: Wyona Almas RN,  December 07, 2010 8:01 AM

## 2011-01-28 NOTE — Assessment & Plan Note (Signed)
Summary: pt//ccm  Nurse Visit   Allergies: No Known Drug Allergies Laboratory Results   Blood Tests      INR: 2.1   (Normal Range: 0.88-1.12   Therap INR: 2.0-3.5) Comments: Rita Ohara  June 15, 2010 10:35 AM     Orders Added: 1)  Est. Patient Level I [99211] 2)  Protime [[id number]]   ANTICOAGULATION RECORD PREVIOUS REGIMEN & LAB RESULTS Anticoagulation Diagnosis:  V58.83,V58.61,415.19 on  05/21/2010 Previous INR Goal Range:  2.0-3.0 on  06/01/2010 Previous INR:  3.3 on  06/01/2010 Previous Coumadin Dose(mg):  7.5mg  qd on  05/21/2010 Previous Regimen:  Hold for 1 day then 5mg  on mon & thu 7.5mg  on other days on  06/01/2010  NEW REGIMEN & LAB RESULTS Current INR: 2.1 Regimen: same  Repeat testing in: 2 weeks  Anticoagulation Visit Questionnaire Coumadin dose missed/changed:  No Abnormal Bleeding Symptoms:  No  Any diet changes including alcohol intake, vegetables or greens since the last visit:  No Any illnesses or hospitalizations since the last visit:  No Any signs of clotting since the last visit (including chest discomfort, dizziness, shortness of breath, arm tingling, slurred speech, swelling or redness in leg):  No  MEDICATIONS RAMIPRIL 10 MG CAPS (RAMIPRIL) qd AMLODIPINE BESYLATE 5 MG TABS (AMLODIPINE BESYLATE) qd DOXYCYCLINE HYCLATE 100 MG CAPS (DOXYCYCLINE HYCLATE) qd SINGULAIR 10 MG TABS (MONTELUKAST SODIUM) qd VENTOLIN HFA 108 (90 BASE) MCG/ACT AERS (ALBUTEROL SULFATE) prn LOVENOX 120 MG/0.8ML SOLN (ENOXAPARIN SODIUM) 105 mg - 0.4ml    bid COUMADIN 7.5 MG TABS (WARFARIN SODIUM) qd CALTRATE 600+D 600-400 MG-UNIT TABS (CALCIUM CARBONATE-VITAMIN D) qd DAILY MULTI  TABS (MULTIPLE VITAMINS-MINERALS) qd FISH OIL 1000 MG CAPS (OMEGA-3 FATTY ACIDS) qd COUMADIN 5 MG TABS (WARFARIN SODIUM) one daily

## 2011-01-28 NOTE — Assessment & Plan Note (Signed)
Summary: pt//ccm  Nurse Visit   Allergies: No Known Drug Allergies Laboratory Results   Blood Tests   Date/Time Received: July 01, 2010 11:23 AM  Date/Time Reported: July 01, 2010 11:23 AM    INR: 1.9   (Normal Range: 0.88-1.12   Therap INR: 2.0-3.5) Comments: Wynona Canes, CMA  July 01, 2010 11:23 AM     Orders Added: 1)  Est. Patient Level I [99211] 2)  Protime [[id number]]  Laboratory Results   Blood Tests      INR: 1.9   (Normal Range: 0.88-1.12   Therap INR: 2.0-3.5) Comments: Wynona Canes, CMA  July 01, 2010 11:23 AM       ANTICOAGULATION RECORD PREVIOUS REGIMEN & LAB RESULTS Anticoagulation Diagnosis:  V58.83,V58.61,415.19 on  05/21/2010 Previous INR Goal Range:  2.0-3.0 on  06/01/2010 Previous INR:  2.1 on  06/15/2010 Previous Coumadin Dose(mg):  7.5mg  qd on  05/21/2010 Previous Regimen:  same on  06/15/2010  NEW REGIMEN & LAB RESULTS Current INR: 1.9 Current Coumadin Dose(mg): 5mg  on mon & thu 7.5mg  on other days Regimen: same  (no change)       Repeat testing in: 2 weeks MEDICATIONS RAMIPRIL 10 MG CAPS (RAMIPRIL) qd AMLODIPINE BESYLATE 5 MG TABS (AMLODIPINE BESYLATE) qd DOXYCYCLINE HYCLATE 100 MG CAPS (DOXYCYCLINE HYCLATE) qd SINGULAIR 10 MG TABS (MONTELUKAST SODIUM) qd VENTOLIN HFA 108 (90 BASE) MCG/ACT AERS (ALBUTEROL SULFATE) prn LOVENOX 120 MG/0.8ML SOLN (ENOXAPARIN SODIUM) 105 mg - 0.20ml    bid COUMADIN 7.5 MG TABS (WARFARIN SODIUM) qd CALTRATE 600+D 600-400 MG-UNIT TABS (CALCIUM CARBONATE-VITAMIN D) qd DAILY MULTI  TABS (MULTIPLE VITAMINS-MINERALS) qd FISH OIL 1000 MG CAPS (OMEGA-3 FATTY ACIDS) qd COUMADIN 5 MG TABS (WARFARIN SODIUM) one daily   Anticoagulation Visit Questionnaire      Coumadin dose missed/changed:  No      Abnormal Bleeding Symptoms:  No   Any diet changes including alcohol intake, vegetables or greens since the last visit:  No Any illnesses or hospitalizations since the last visit:  No Any signs of  clotting since the last visit (including chest discomfort, dizziness, shortness of breath, arm tingling, slurred speech, swelling or redness in leg):  No

## 2011-01-28 NOTE — Letter (Signed)
Summary: [facility] Instructions  [facility]  [address] [place], [place] [address]   Phone: [phone]  Fax: [phone]       DEBORAH DONDERO    Apr 07, 1951    MRN: [id number]        Procedure Day Dorna Bloom:  Lenor Coffin  12/31/10     Arrival Time:  10:00AM     Procedure Time:  11:00AM     Location of Procedure:                    _X _   [facility] ([facility]th [facility])                      PREPARATION FOR COLONOSCOPY WITH MOVIPREP   Starting 5 days prior to your procedure 12/26/10 do not eat nuts, seeds, popcorn, corn, beans, peas,  salads, or any raw vegetables.  Do not take any fiber supplements (e.g. Metamucil, Citrucel, and Benefiber).  THE DAY BEFORE YOUR PROCEDURE         DATE: 12/30/10  DAY: WEDNESDAY  1.  Drink clear liquids the entire day-NO SOLID FOOD  2.  Do not drink anything colored red or purple.  Avoid juices with pulp.  No orange juice.  3.  Drink at least 64 oz. (8 glasses) of fluid/clear liquids during the day to prevent dehydration and help the prep work efficiently.  CLEAR LIQUIDS INCLUDE: Water Jello Ice Popsicles Tea (sugar ok, no milk/cream) Powdered fruit flavored drinks Coffee (sugar ok, no milk/cream) Gatorade Juice: apple, white grape, white cranberry  Lemonade Clear bullion, consomm, broth Carbonated beverages (any kind) Strained chicken noodle soup Hard Candy                             4.  In the morning, mix first dose of MoviPrep solution:    Empty 1 Pouch A and 1 Pouch B into the disposable container    Add lukewarm drinking water to the top line of the container. Mix to dissolve    Refrigerate (mixed solution should be used within 24 hrs)  5.  Begin drinking the prep at 5:00 p.m. The MoviPrep container is divided by 4 marks.   Every 15 minutes drink the solution down to the next mark (approximately 8 oz) until the full liter is complete.   6.  Follow completed prep with 16 oz of clear liquid of your choice  (Nothing red or purple).  Continue to drink clear liquids until bedtime.  7.  Before going to bed, mix second dose of MoviPrep solution:    Empty 1 Pouch A and 1 Pouch B into the disposable container    Add lukewarm drinking water to the top line of the container. Mix to dissolve    Refrigerate  THE DAY OF YOUR PROCEDURE      DATE: 12/31/10    DAY: THURSDAY  Beginning at 6:00AM (5 hours before procedure):         1. Every 15 minutes, drink the solution down to the next mark (approx 8 oz) until the full liter is complete.  2. Follow completed prep with 16 oz. of clear liquid of your choice.    3. You may drink clear liquids until 9:00AM (2 HOURS BEFORE PROCEDURE).   MEDICATION INSTRUCTIONS  Unless otherwise instructed, you should take regular prescription medications with a small sip of water   as early as possible the morning  of your procedure.           OTHER INSTRUCTIONS  You will need a responsible adult at least 58 years of age to accompany you and drive you home.   This person must remain in the waiting room during your procedure.  Wear loose fitting clothing that is easily removed.  Leave jewelry and other valuables at home.  However, you may wish to bring a book to read or  an iPod/MP3 player to listen to music as you wait for your procedure to start.  Remove all body piercing jewelry and leave at home.  Total time from sign-in until discharge is approximately 2-3 hours.  You should go home directly after your procedure and rest.  You can resume normal activities the  day after your procedure.  The day of your procedure you should not:   Drive   Make legal decisions   Operate machinery   Drink alcohol   Return to work  You will receive specific instructions about eating, activities and medications before you leave.    The above instructions have been reviewed and explained to me by   Clide Cliff, RN_____________________    I fully  understand and can verbalize these instructions _____________________________ Date _________

## 2011-01-28 NOTE — Assessment & Plan Note (Signed)
Summary: 4 month ov//ccm/then go to pt/cjr----PT [facility] // RS   Vital Signs:  Patient profile:   60 year old male Weight:      234 pounds Temp:     98.3 degrees F oral BP sitting:   150 / 92  (left arm) Cuff size:   large  Vitals Entered By: Sid Falcon LPN (November 17, 2010 4:25 PM)  History of Present Illness: 60 year old patient who is seen today for follow-up of an acute pulmonary embolism 6 months ago.  He has a factor V Leiden deficiency and has been on chronic Coumadin.  He feels well without pulmonary complaints.  He needs dental surgery, and colonoscopy in the near future.  The plan was to postpone the select procedures until 6 months of anticoagulation.  He has treated hypertension, which is followed at work.  He has a history of asthma, which has been well-controlled  Allergies (verified): No Known Drug Allergies  Past History:  Past Medical History: Reviewed history from 05/21/2010 and no changes required. history of pulmonary emboli, May 2011 factor V Leiden deficiency Asthma Hypertension Allergic rhinitis  Family History: Reviewed history from 05/21/2010 and no changes required. positive for coronary artery disease, and hypertension one brother died of pancreatic cancer father died of renal cancer  Social History: Reviewed history from 05/21/2010 and no changes required. Ph.D., Emergency planning/management officer for [employer] Married Never Smoked  Review of Systems  The patient denies anorexia, fever, weight loss, weight gain, vision loss, decreased hearing, hoarseness, chest pain, syncope, dyspnea on exertion, peripheral edema, prolonged cough, headaches, hemoptysis, abdominal pain, melena, hematochezia, severe indigestion/heartburn, hematuria, incontinence, genital sores, muscle weakness, suspicious skin lesions, transient blindness, difficulty walking, depression, unusual weight change, abnormal bleeding, enlarged lymph nodes, angioedema, breast masses, and testicular masses.     Physical Exam  General:  Well-developed,well-nourished,in no acute distress; alert,appropriate and cooperative throughout examination; for pressure 140/80 on repeat Head:  Normocephalic and atraumatic without obvious abnormalities. No apparent alopecia or balding. Neck:  No deformities, masses, or tenderness noted. Lungs:  Normal respiratory effort, chest expands symmetrically. Lungs are clear to auscultation, no crackles or wheezes. Heart:  Normal rate and regular rhythm. S1 and S2 normal without gallop, murmur, click, rub or other extra sounds. Msk:  no swelling, or edema   Impression & Recommendations:  Problem # 1:  COUMADIN THERAPY (ICD-V58.61)  Orders: Protime (16109UE) Fingerstick (45409) will discontinue  Problem # 2:  PULMONARY EMBOLISM (ICD-415.19)  His updated medication list for this problem includes:    Lovenox 120 Mg/0.89ml Soln (Enoxaparin sodium) .Marland KitchenMarland KitchenMarland KitchenMarland Kitchen 105 mg - 0.57ml    bid    Coumadin 7.5 Mg Tabs (Warfarin sodium) ..... Qd    Coumadin 5 Mg Tabs (Warfarin sodium) ..... One daily  Orders: Protime (81191YN) Fingerstick (82956)  Problem # 3:  HYPERTENSION (ICD-401.9)  His updated medication list for this problem includes:    Ramipril 10 Mg Caps (Ramipril) ..... Qd    Amlodipine Besylate 5 Mg Tabs (Amlodipine besylate) ..... Qd  Complete Medication List: 1)  Ramipril 10 Mg Caps (Ramipril) .... Qd 2)  Amlodipine Besylate 5 Mg Tabs (Amlodipine besylate) .... Qd 3)  Doxycycline Hyclate 100 Mg Caps (Doxycycline hyclate) .... Qd 4)  Singulair 10 Mg Tabs (Montelukast sodium) .... Qd 5)  Ventolin Hfa 108 (90 Base) Mcg/act Aers (Albuterol sulfate) .... Prn 6)  Lovenox 120 Mg/0.51ml Soln (Enoxaparin sodium) .Marland Kitchen.. 105 mg - 0.1ml    bid 7)  Coumadin 7.5 Mg Tabs (Warfarin sodium) .... Qd 8)  Caltrate 600+d 600-400 Mg-unit Tabs (Calcium carbonate-vitamin d) .... Qd 9)  Daily Multi Tabs (Multiple vitamins-minerals) .... Qd 10)  Fish Oil 1000 Mg Caps (Omega-3 fatty acids)  .... Qd 11)  Coumadin 5 Mg Tabs (Warfarin sodium) .... One daily  Patient Instructions: 1)  Please schedule a follow-up appointment in 6 months. 2)  Limit your Sodium (Salt). 3)  It is important that you exercise regularly at least 20 minutes 5 times a week. If you develop chest pain, have severe difficulty breathing, or feel very tired , stop exercising immediately and seek medical attention. 4)  You need to lose weight. Consider a lower calorie diet and regular exercise.  5)  Take an Aspirin every day.   Orders Added: 1)  Protime [85610QW] 2)  Fingerstick [36416] 3)  Est. Patient Level III [84696]   Immunization History:  Influenza Immunization History:    Influenza:  historical (09/29/2010)   Immunization History:  Influenza Immunization History:    Influenza:  Historical (09/29/2010)  Laboratory Results   Blood Tests   Date/Time Recieved: November 17, 2010 3:47 PM  Date/Time Reported: November 17, 2010 3:47 PM    INR: 2.1   (Normal Range: 0.88-1.12   Therap INR: 2.0-3.5) Comments: Wynona Canes, CMA  November 17, 2010 3:47 PM       ANTICOAGULATION RECORD PREVIOUS REGIMEN & LAB RESULTS Anticoagulation Diagnosis:  V58.83,V58.61,415.19 on  05/21/2010 Previous INR Goal Range:  2.0-3.0 on  06/01/2010 Previous INR:  1.9 on  10/23/2010 Previous Coumadin Dose(mg):  5mg  only on sun 7.5mg  on other days on  08/26/2010 Previous Regimen:  Same Dose on  09/23/2010  NEW REGIMEN & LAB RESULTS Current INR: 2.1 Regimen: Same Dose  (no change)       Repeat testing in: 4 weeks MEDICATIONS RAMIPRIL 10 MG CAPS (RAMIPRIL) qd AMLODIPINE BESYLATE 5 MG TABS (AMLODIPINE BESYLATE) qd DOXYCYCLINE HYCLATE 100 MG CAPS (DOXYCYCLINE HYCLATE) qd SINGULAIR 10 MG TABS (MONTELUKAST SODIUM) qd VENTOLIN HFA 108 (90 BASE) MCG/ACT AERS (ALBUTEROL SULFATE) prn LOVENOX 120 MG/0.8ML SOLN (ENOXAPARIN SODIUM) 105 mg - 0.68ml    bid COUMADIN 7.5 MG TABS (WARFARIN SODIUM) qd CALTRATE 600+D  600-400 MG-UNIT TABS (CALCIUM CARBONATE-VITAMIN D) qd DAILY MULTI  TABS (MULTIPLE VITAMINS-MINERALS) qd FISH OIL 1000 MG CAPS (OMEGA-3 FATTY ACIDS) qd COUMADIN 5 MG TABS (WARFARIN SODIUM) one daily   Anticoagulation Visit Questionnaire      Coumadin dose missed/changed:  No      Abnormal Bleeding Symptoms:  No   Any diet changes including alcohol intake, vegetables or greens since the last visit:  No Any illnesses or hospitalizations since the last visit:  No Any signs of clotting since the last visit (including chest discomfort, dizziness, shortness of breath, arm tingling, slurred speech, swelling or redness in leg):  No

## 2011-01-28 NOTE — Letter (Signed)
Summary: Patient Notice- Polyp Results  [facility]  [address] [place], [place] [address]   Phone: [phone]  Fax: [phone]        January 02, 2011 MRN: [id number]    [facility] [address] [place], [place]  [address]    Dear [name],  I am pleased to inform you that the colon polyp(s) removed during your recent colonoscopy was (were) found to be benign (no cancer detected) upon pathologic examination.  I recommend you have a repeat colonoscopy examination in 5 years to look for recurrent polyps, as having colon polyps increases your risk for having recurrent polyps or even colon cancer in the future.  Should you develop new or worsening symptoms of abdominal pain, bowel habit changes or bleeding from the rectum or bowels, please schedule an evaluation with either your primary care physician or with me.    Additional information/recommendations:  __ No further action with gastroenterology is needed at this time. Please      follow-up with your primary care physician for your other healthcare      needs.    Please call us if you are having persistent problems or have questions about your condition that have not been fully answered at this time.  Sincerely,  Hilarie Fredrickson MD  This letter has been electronically signed by your physician.  Appended Document: Patient Notice- Polyp Results Letter mailed

## 2011-01-28 NOTE — Miscellaneous (Signed)
Summary: direct colon--ch.  Clinical Lists Changes  Medications: Added new medication of MOVIPREP 100 GM  SOLR (PEG-KCL-NACL-NASULF-NA ASC-C) As per prep instructions. - Signed Rx of MOVIPREP 100 GM  SOLR (PEG-KCL-NACL-NASULF-NA ASC-C) As per prep instructions.;  #1 x 0;  Signed;  Entered by: Clide Cliff RN;  Authorized by: Hilarie Fredrickson MD;  Method used: Electronically to [id number] [address], [place], [place]  [address], Ph: [phone] or [phone], Fax: [phone] Observations: Added new observation of ALLERGY REV: Done (12/10/2010 16:08)    Prescriptions: MOVIPREP 100 GM  SOLR (PEG-KCL-NACL-NASULF-NA ASC-C) As per prep instructions.  #1 x 0   Entered by:   Clide Cliff RN   Authorized by:   Hilarie Fredrickson MD   Signed by:   Clide Cliff RN on 12/10/2010   Method used:   Electronically to        [facility] (retail)       [address]       [place], [place]  [address]       Ph: [phone] or [phone]       Fax: [phone]   RxID:   [id number]

## 2011-01-28 NOTE — Assessment & Plan Note (Signed)
Summary: pt//ccm  Nurse Visit   Allergies: No Known Drug Allergies Laboratory Results   Blood Tests   Date/Time Received: August 26, 2010 12:05 PM  Date/Time Reported: August 26, 2010 12:05 PM    INR: 2.2   (Normal Range: 0.88-1.12   Therap INR: 2.0-3.5) Comments: Wynona Canes, CMA  August 26, 2010 12:05 PM     Orders Added: 1)  Est. Patient Level I [99211] 2)  Protime [[id number]]  Laboratory Results   Blood Tests      INR: 2.2   (Normal Range: 0.88-1.12   Therap INR: 2.0-3.5) Comments: Wynona Canes, CMA  August 26, 2010 12:05 PM       ANTICOAGULATION RECORD PREVIOUS REGIMEN & LAB RESULTS Anticoagulation Diagnosis:  V58.83,V58.61,415.19 on  05/21/2010 Previous INR Goal Range:  2.0-3.0 on  06/01/2010 Previous INR:  1.8 on  07/20/2010 Previous Coumadin Dose(mg):  5mg  on mon & thu 7.5mg  on other days on  07/01/2010 Previous Regimen:  5mg . Sun. only 7.5mg . all other days on  07/20/2010  NEW REGIMEN & LAB RESULTS Current INR: 2.2 Current Coumadin Dose(mg): 5mg  only on sun 7.5mg  on other days Regimen: 5mg . Sun. only 7.5mg . all other days  (no change)       Repeat testing in: 4 weeks MEDICATIONS RAMIPRIL 10 MG CAPS (RAMIPRIL) qd AMLODIPINE BESYLATE 5 MG TABS (AMLODIPINE BESYLATE) qd DOXYCYCLINE HYCLATE 100 MG CAPS (DOXYCYCLINE HYCLATE) qd SINGULAIR 10 MG TABS (MONTELUKAST SODIUM) qd VENTOLIN HFA 108 (90 BASE) MCG/ACT AERS (ALBUTEROL SULFATE) prn LOVENOX 120 MG/0.8ML SOLN (ENOXAPARIN SODIUM) 105 mg - 0.27ml    bid COUMADIN 7.5 MG TABS (WARFARIN SODIUM) qd CALTRATE 600+D 600-400 MG-UNIT TABS (CALCIUM CARBONATE-VITAMIN D) qd DAILY MULTI  TABS (MULTIPLE VITAMINS-MINERALS) qd FISH OIL 1000 MG CAPS (OMEGA-3 FATTY ACIDS) qd COUMADIN 5 MG TABS (WARFARIN SODIUM) one daily   Anticoagulation Visit Questionnaire      Coumadin dose missed/changed:  No      Abnormal Bleeding Symptoms:  No   Any diet changes including alcohol intake, vegetables or greens since  the last visit:  No Any illnesses or hospitalizations since the last visit:  No Any signs of clotting since the last visit (including chest discomfort, dizziness, shortness of breath, arm tingling, slurred speech, swelling or redness in leg):  No

## 2011-01-28 NOTE — Assessment & Plan Note (Signed)
Summary: PT//CCM  Nurse Visit   Allergies: No Known Drug Allergies Laboratory Results   Blood Tests      INR: 2.7   (Normal Range: 0.88-1.12   Therap INR: 2.0-3.5) Comments: Rita Ohara  May 26, 2010 10:59 AM     Orders Added: 1)  Est. Patient Level I [99211] 2)  Protime [16109UE]   ANTICOAGULATION RECORD PREVIOUS REGIMEN & LAB RESULTS Anticoagulation Diagnosis:  V58.83,V58.61,415.19 on  05/21/2010 Previous INR Goal Range:  2.0-3.0 on  05/21/2010 Previous INR:  1.5 on  05/21/2010 Previous Coumadin Dose(mg):  7.5mg  qd on  05/21/2010 Previous Regimen:  7.5mg  qd on  05/21/2010  NEW REGIMEN & LAB RESULTS Current INR: 2.7 Regimen: same  Repeat testing in: Mon.  Anticoagulation Visit Questionnaire Coumadin dose missed/changed:  No Abnormal Bleeding Symptoms:  No  Any diet changes including alcohol intake, vegetables or greens since the last visit:  No Any illnesses or hospitalizations since the last visit:  No Any signs of clotting since the last visit (including chest discomfort, dizziness, shortness of breath, arm tingling, slurred speech, swelling or redness in leg):  No  MEDICATIONS RAMIPRIL 10 MG CAPS (RAMIPRIL) qd AMLODIPINE BESYLATE 5 MG TABS (AMLODIPINE BESYLATE) qd DOXYCYCLINE HYCLATE 100 MG CAPS (DOXYCYCLINE HYCLATE) qd SINGULAIR 10 MG TABS (MONTELUKAST SODIUM) qd VENTOLIN HFA 108 (90 BASE) MCG/ACT AERS (ALBUTEROL SULFATE) prn LOVENOX 120 MG/0.8ML SOLN (ENOXAPARIN SODIUM) 105 mg - 0.28ml    bid COUMADIN 7.5 MG TABS (WARFARIN SODIUM) qd CALTRATE 600+D 600-400 MG-UNIT TABS (CALCIUM CARBONATE-VITAMIN D) qd DAILY MULTI  TABS (MULTIPLE VITAMINS-MINERALS) qd FISH OIL 1000 MG CAPS (OMEGA-3 FATTY ACIDS) qd COUMADIN 5 MG TABS (WARFARIN SODIUM) one daily

## 2011-03-15 LAB — PROTIME-INR
INR: 1.03 (ref 0.00–1.49)
Prothrombin Time: 13.4 seconds (ref 11.6–15.2)

## 2011-03-15 LAB — CREATININE, SERUM
Creatinine, Ser: 0.83 mg/dL (ref 0.4–1.5)
GFR calc Af Amer: >60 mL/min (ref >60)
GFR calc non Af Amer: >60 mL/min (ref >60)

## 2011-03-15 LAB — BUN: BUN: 18 mg/dL (ref 6–23)

## 2011-03-15 LAB — CBC
MCV: 92.6 fL (ref 78.0–100.0)
Platelets: 249 10*3/uL (ref 150–400)
RDW: 14.2 % (ref 11.5–15.5)

## 2011-03-15 LAB — APTT: aPTT: 29 seconds (ref 24–37)

## 2011-05-14 NOTE — Op Note (Signed)
[place]  Patient:    Matthew Trujillo, Matthew Trujillo Visit Number: [id number] MRN: [id number]          Service Type: DSU Location: [facility] Attending Physician:  Claudina Lick Dictated by:   Doris Cheadle. Lyman Bishop, M.D. Proc. Date: 02/08/02 Admit Date:  02/08/2002                             Operative Report  PREOPERATIVE DIAGNOSES: 1. Obstructive sleep apnea syndrome with snoring. 2. Deviated nasal septum. 3. Nasal turbinate hypertrophy.  POSTOPERATIVE DIAGNOSES: 1. Obstructive sleep apnea syndrome with snoring. 2. Deviated nasal septum. 3. Nasal turbinate hypertrophy.  PROCEDURES: 1. Nasal septoplasty. 2. Submucous resection, right inferior nasal turbinate. 3. Uvulopalatopharyngoplasty.  SURGEON:  Robert L. Lyman Bishop, M.D.  ANESTHESIA:  General.  DESCRIPTION OF PROCEDURE:  This patient has had a long-standing history of nasal obstruction.  He has also had some obstructive apnea symptoms with associated severe snoring.  Examination showed a moderate septal deviation to the right with enlarged turbinates and a thickened, elongated uvula.  CT scan of sinuses within normal limits.  Sleep study showed loud snoring but otherwise unremarkable.  Patient admitted for surgery.  DESCRIPTION OF PROCEDURE:  After satisfactory general endotracheal anesthesia had been induced, topical epinephrine packs were placed intranasally, after which the nasal septum, the inferior turbinates, and the lower portion of the soft palate were infiltrated with 1% Xylocaine containing 1:100,000 epinephrine for hemostasis using a total of 10 cc, after which the nose and face were prepped with Betadine and sterile drapes applied.  Examination showed evidence of previous nasal trauma with a mild saddle deformity.  The septum was thickened with bilateral maxillary crest spurs and the cartilage was displaced mildly to the right, but there was a marked posterior  vomerine spur to the right.  There was bilateral enlargement of the inferior turbinates, the right actually larger than the left more posteriorly.  A left anterior septal incision was made, the mucoperichondrium and periosteum elevated initially on the left side and the cartilage was separated from the perpendicular plate, leaving about a centimeter attachment superiorly, after which the mucoperiosteal flap on the right side was then elevated.  The deviated portion of the perpendicular plate and vomerine spur were removed with a chisel and Jansen-Middleton bone-biting forceps.  A small bony spur was removed from the left side of the maxillary crest with a chisel.  There was still a significant maxillary crest spur on the right side, and a small incision was made just anterior to the spur and after elevating the soft tissue, the bony spur was removed with a chisel.  The posterior septal flaps were then reapproximated with mattress sutures of 4-0 plain gut, and the cartilage was stabilized in the midline with two mattress sutures of 4-0 plain gut placed in the [facility] technique, after which the septal incision was closed with running 5-0 chromic gut.  A small incision made over the anterior end of the right inferior nasal turbinate, the mucoperiosteum elevated.  The turbinate bone was quite thick.  It was necessary to take down its attachment with a chisel, and then a portion of the turbinate bone was removed with bone-biting forceps and the incision closed with a running 5-0 chromic gut. The left inferior turbinate I simply crushed and lateralized.  Depo-Medrol 20 mg was infiltrated into each inferior nasal turbinate, and a Doyle splint which was reduced in size slightly, coated with  Cortisporin ointment, was then placed along each side of the nasal septum and secured anteriorly with a suture of 5-0 chromic gut tied down over a small bolus of Telfa.  A Crowe-Davis mouth gag was then inserted  and suspended from the Mayo stand. the uvula was quite thickened and elongated.  An incision was made from the apex of the tonsillar fossa on each side across the lower edge of the soft palate to the apex on the left side, extending down on the posterior tonsillar pillar on each side, and the lower edge of the soft palate, the distal 0.5 cm along with the uvula, was excised with the scalpel.  The anterior and posterior mucosal surfaces were then sutured together with a running 4-0 chromic suture, and again 40 mg of Depo-Medrol was infiltrated into the remaining soft palate.  Estimated blood loss less than 50 cc.  The patient tolerated the procedure well, was awakened from anesthesia, and taken to the recovery room in satisfactory condition. Dictated by:   Doris Cheadle. Lyman Bishop, M.D. Attending Physician:  Claudina Lick DD:  02/08/02 TD:  02/08/02 Job: [id number]

## 2011-05-18 ENCOUNTER — Ambulatory Visit: Payer: Self-pay | Admitting: Internal Medicine

## 2011-05-28 ENCOUNTER — Encounter: Payer: Self-pay | Admitting: Internal Medicine

## 2011-05-31 ENCOUNTER — Encounter: Payer: Self-pay | Admitting: Internal Medicine

## 2011-05-31 ENCOUNTER — Ambulatory Visit (INDEPENDENT_AMBULATORY_CARE_PROVIDER_SITE_OTHER): Payer: BC Managed Care – PPO | Admitting: Internal Medicine

## 2011-05-31 DIAGNOSIS — I2699 Other pulmonary embolism without acute cor pulmonale: Secondary | ICD-10-CM

## 2011-05-31 DIAGNOSIS — I10 Essential (primary) hypertension: Secondary | ICD-10-CM

## 2011-05-31 MED ORDER — ALBUTEROL SULFATE HFA 108 (90 BASE) MCG/ACT IN AERS: 2.0000 | INHALATION_SPRAY | Freq: Four times a day (QID) | RESPIRATORY_TRACT | Status: DC | PRN

## 2011-05-31 NOTE — Progress Notes (Signed)
  Subjective:    Patient ID: Matthew Trujillo, male    DOB: [birth year], 60 y.o.   MRN: [id number]  HPI  60 year old patient who is seen today for followup. He has a history of treated hypertension allergic rhinitis and asthma he has been quite stable. Coumadin anticoagulation was discontinued approximately 7 months ago after 6 months of treatment for an acute pulmonary emboli. He is known to have factor V Leiden deficiency. He is on daily aspirin 325 mg which has been recommended for a minimum 24 months. He is doing quite well. No GI intolerance    Review of Systems  Constitutional: Negative for fever, chills, appetite change and fatigue.  HENT: Negative for hearing loss, ear pain, congestion, sore throat, trouble swallowing, neck stiffness, dental problem, voice change and tinnitus.   Eyes: Negative for pain, discharge and visual disturbance.  Respiratory: Negative for cough, chest tightness, wheezing and stridor.   Cardiovascular: Negative for chest pain, palpitations and leg swelling.  Gastrointestinal: Negative for nausea, vomiting, abdominal pain, diarrhea, constipation, blood in stool and abdominal distention.  Genitourinary: Negative for urgency, hematuria, flank pain, discharge, difficulty urinating and genital sores.  Musculoskeletal: Negative for myalgias, back pain, joint swelling, arthralgias and gait problem.  Skin: Negative for rash.  Neurological: Negative for dizziness, syncope, speech difficulty, weakness, numbness and headaches.  Hematological: Negative for adenopathy. Does not bruise/bleed easily.  Psychiatric/Behavioral: Negative for behavioral problems and dysphoric mood. The patient is not nervous/anxious.        Objective:   Physical Exam  Constitutional: He is oriented to person, place, and time. He appears well-developed.  HENT:  Head: Normocephalic.  Right Ear: External ear normal.  Left Ear: External ear normal.  Eyes: Conjunctivae and EOM are normal.  Neck:  Normal range of motion.  Cardiovascular: Normal rate and normal heart sounds.   Pulmonary/Chest: Breath sounds normal.  Abdominal: Bowel sounds are normal.  Musculoskeletal: Normal range of motion. He exhibits no edema and no tenderness.  Neurological: He is alert and oriented to person, place, and time.  Psychiatric: He has a normal mood and affect. His behavior is normal.          Assessment & Plan:   Hypertension well controlled History pulmonary emboli. We'll continue aspirin 325 daily for a minimum of 24 months total  Recheck in 6 months for a complete physical

## 2011-05-31 NOTE — Patient Instructions (Signed)
Take 325  mg of aspirin daily    It is important that you exercise regularly, at least 20 minutes 3 to 4 times per week.  If you develop chest pain or shortness of breath seek  medical attention.  Return in 6 months for follow-up

## 2011-12-30 HISTORY — PX: OTHER SURGICAL HISTORY: SHX169

## 2011-12-30 HISTORY — PX: DOPPLER ECHOCARDIOGRAPHY: SHX263

## 2012-05-21 ENCOUNTER — Ambulatory Visit (INDEPENDENT_AMBULATORY_CARE_PROVIDER_SITE_OTHER): Payer: BC Managed Care – PPO | Admitting: Family Medicine

## 2012-05-21 DIAGNOSIS — J45909 Unspecified asthma, uncomplicated: Secondary | ICD-10-CM

## 2012-05-21 DIAGNOSIS — D6859 Other primary thrombophilia: Secondary | ICD-10-CM

## 2012-05-21 DIAGNOSIS — D6851 Activated protein C resistance: Secondary | ICD-10-CM

## 2012-05-21 MED ORDER — ALBUTEROL SULFATE HFA 108 (90 BASE) MCG/ACT IN AERS: 1.0000 | INHALATION_SPRAY | Freq: Four times a day (QID) | RESPIRATORY_TRACT | Status: DC | PRN

## 2012-05-21 MED ORDER — IPRATROPIUM BROMIDE 0.02 % IN SOLN
0.5000 mg | Freq: Once | RESPIRATORY_TRACT | Status: AC
  Administered 2012-05-21: 0.5 mg via RESPIRATORY_TRACT

## 2012-05-21 MED ORDER — AZITHROMYCIN 250 MG PO TABS: ORAL_TABLET | ORAL | Status: AC

## 2012-05-21 MED ORDER — MOMETASONE FURO-FORMOTEROL FUM 200-5 MCG/ACT IN AERO: 2.0000 | INHALATION_SPRAY | Freq: Two times a day (BID) | RESPIRATORY_TRACT | Status: DC

## 2012-05-21 MED ORDER — ALBUTEROL SULFATE (2.5 MG/3ML) 0.083% IN NEBU
2.5000 mg | INHALATION_SOLUTION | Freq: Once | RESPIRATORY_TRACT | Status: AC
  Administered 2012-05-21: 2.5 mg via RESPIRATORY_TRACT

## 2012-05-21 NOTE — Progress Notes (Signed)
  Subjective:    Patient ID: Matthew Trujillo, male    DOB: 1951-07-31, 61 y.o.   MRN: [id number]  HPI Patient presents complaining of persistent cough productive of purulent sputum.  Asthma more symptomatic with increased chest tightness, wheezing and cough. Last night unable to sleep  In the past asthma controlled with Singulair and prn Albuterol  Recent viral illness 1 weeks ago  Using albuterol q 4 hours   Factor V Leiden deficiency- PE 2 years ago  Review of Systems     Objective:   Physical Exam  Constitutional: He appears well-developed.  HENT:       Nares boggy   Eyes: Conjunctivae are normal.  Neck: Neck supple.  Cardiovascular: Normal rate, regular rhythm and normal heart sounds.   Pulmonary/Chest:       Scattered wheezes; after albuterol/atrovent via HHN CTA  Lymphadenopathy:    He has no cervical adenopathy.  Neurological: He is alert.  Skin: Skin is warm.        Assessment & Plan:   1. Asthmatic bronchitis  albuterol (PROVENTIL) (2.5 MG/3ML) 0.083% nebulizer solution 2.5 mg, ipratropium (ATROVENT) nebulizer solution 0.5 mg, Mometasone Furo-Formoterol Fum 200-5 MCG/ACT AERO, azithromycin (ZITHROMAX) 250 MG tablet, albuterol (PROVENTIL HFA;VENTOLIN HFA) 108 (90 BASE) MCG/ACT inhaler  2. Factor V Leiden      Anticipatory guidance

## 2012-06-15 ENCOUNTER — Other Ambulatory Visit: Payer: Self-pay | Admitting: Unknown Physician Specialty

## 2012-06-15 DIAGNOSIS — M899 Disorder of bone, unspecified: Secondary | ICD-10-CM

## 2012-07-03 ENCOUNTER — Ambulatory Visit
Admission: RE | Admit: 2012-07-03 | Discharge: 2012-07-03 | Disposition: A | Discharge status: Home / Self Care | Payer: BC Managed Care – PPO | Source: Ambulatory Visit | Attending: Unknown Physician Specialty | Admitting: Unknown Physician Specialty

## 2012-07-03 DIAGNOSIS — M949 Disorder of cartilage, unspecified: Secondary | ICD-10-CM

## 2012-07-03 DIAGNOSIS — M899 Disorder of bone, unspecified: Secondary | ICD-10-CM

## 2013-12-21 ENCOUNTER — Ambulatory Visit (INDEPENDENT_AMBULATORY_CARE_PROVIDER_SITE_OTHER): Payer: BC Managed Care – PPO | Admitting: Emergency Medicine

## 2013-12-21 VITALS — BP 122/72 | HR 76 | Temp 99.4°F | Resp 18 | Ht 73.25 in | Wt 228.2 lb

## 2013-12-21 DIAGNOSIS — J45901 Unspecified asthma with (acute) exacerbation: Secondary | ICD-10-CM

## 2013-12-21 MED ORDER — ALBUTEROL SULFATE (2.5 MG/3ML) 0.083% IN NEBU: 5.0000 mg | INHALATION_SOLUTION | Freq: Once | RESPIRATORY_TRACT | Status: DC

## 2013-12-21 MED ORDER — IPRATROPIUM BROMIDE 0.02 % IN SOLN: 0.5000 mg | Freq: Once | RESPIRATORY_TRACT | Status: DC

## 2013-12-21 NOTE — Progress Notes (Signed)
[facility] [address] [place] [address] [phone]  Date:  12/21/2013   Name:  Matthew Trujillo   DOB:  January 31, 1951   MRN:  [id number]  PCP:  Rogelia Boga, MD    Chief Complaint: Asthma   History of Present Illness:  Matthew Trujillo is a 62 y.o. very pleasant male patient who presents with the following:  History of asthma.  Last medical treatment for exacerbation he says is over one year ago.  He complains of increased shortness of breath and wheezing.  Says that he has "used his ventolin more times this week than in past six months."  No fever or chills.  Mucoid sputum with moderate cough.  No fever or chills, chest pain, nausea or vomiting.  No rash or ankle or leg swelling or pain. No improvement with over the counter medications or other home remedies. Denies other complaint or health concern today.   Patient Active Problem List   Diagnosis Date Noted  . HYPERTENSION 05/21/2010  . PULMONARY EMBOLISM 05/21/2010  . ALLERGIC RHINITIS 05/21/2010  . ASTHMA 05/21/2010    Past Medical History  Diagnosis Date  . ALLERGIC RHINITIS 05/21/2010  . ASTHMA 05/21/2010  . HYPERTENSION 05/21/2010  . PULMONARY EMBOLISM 05/21/2010  . Factor V Leiden     Past Surgical History  Procedure Laterality Date  . Achilles tendon repair      History  Substance Use Topics  . Smoking status: Never Smoker   . Smokeless tobacco: Never Used  . Alcohol Use: Yes    Family History  Problem Relation Age of Onset  . Heart disease Neg Hx     family  . Cancer Father   . Hypertension Mother     No Known Allergies  Medication list has been reviewed and updated.  Current Outpatient Prescriptions on File Prior to Visit  Medication Sig Dispense Refill  . albuterol (PROVENTIL HFA;VENTOLIN HFA) 108 (90 BASE) MCG/ACT inhaler Inhale 1 puff into the lungs every 6 (six) hours as needed for wheezing.  1 Inhaler  0  . albuterol (VENTOLIN HFA) 108 (90 BASE) MCG/ACT  inhaler Inhale 2 puffs into the lungs every 6 (six) hours as needed.  1 Inhaler  6  . amLODipine (NORVASC) 5 MG tablet Take 5 mg by mouth daily.        Marland Kitchen aspirin 325 MG tablet Take 325 mg by mouth daily.        . Calcium Carbonate-Vitamin D (CALTRATE 600+D) 600-400 MG-UNIT per tablet Take 1 tablet by mouth daily.        . montelukast (SINGULAIR) 10 MG tablet Take 10 mg by mouth daily.        . Multiple Vitamin (MULTIVITAMIN) tablet Take 1 tablet by mouth daily.        . Omega-3 Fatty Acids (FISH OIL) 1000 MG CAPS Take by mouth daily.        . ramipril (ALTACE) 10 MG capsule Take 10 mg by mouth daily.        . Mometasone Furo-Formoterol Fum 200-5 MCG/ACT AERO Inhale 2 puffs into the lungs 2 (two) times daily.  1 Inhaler  0   No current facility-administered medications on file prior to visit.    Review of Systems:  As per HPI, otherwise negative.    Physical Examination: Filed Vitals:   12/21/13 1000  BP: 122/72  Pulse: 76  Temp: 99.4 F (37.4 C)  Resp: 18   Filed Vitals:  12/21/13 1000  Height: 6' 1.25" (1.861 m)  Weight: 228 lb 3.2 oz (103.511 kg)   Body mass index is 29.89 kg/(m^2). Ideal Body Weight: Weight in (lb) to have BMI = 25: 190.4  GEN: WDWN, NAD, Non-toxic, A & O x 3  Speaks in full sentences.  Not tachypneic HEENT: Atraumatic, Normocephalic. Neck supple. No masses, No LAD. Ears and Nose: No external deformity. CV: RRR, No M/G/R. No JVD. No thrill. No extra heart sounds. PULM: CTA B, no wheezes, crackles, rhonchi. No retractions. No resp. distress. No accessory muscle use. ABD: S, NT, ND, +BS. No rebound. No HSM. EXTR: No c/c/e NEURO Normal gait.  PSYCH: Normally interactive. Conversant. Not depressed or anxious appearing.  Calm demeanor.    Assessment and Plan: Exacerbation asthma Improved with treatment. Follow up as needed   Signed,  Phillips Odor, MD

## 2013-12-21 NOTE — Patient Instructions (Signed)

## 2013-12-22 ENCOUNTER — Other Ambulatory Visit: Payer: Self-pay | Admitting: Cardiology

## 2013-12-24 NOTE — Telephone Encounter (Signed)
Rx was sent to pharmacy electronically. 

## 2014-01-11 ENCOUNTER — Encounter: Payer: Self-pay | Admitting: Cardiology

## 2014-01-11 ENCOUNTER — Ambulatory Visit (INDEPENDENT_AMBULATORY_CARE_PROVIDER_SITE_OTHER): Payer: BC Managed Care – PPO | Admitting: Cardiology

## 2014-01-11 VITALS — BP 110/78 | HR 66 | Ht 74.0 in | Wt 231.6 lb

## 2014-01-11 DIAGNOSIS — I10 Essential (primary) hypertension: Secondary | ICD-10-CM

## 2014-01-11 DIAGNOSIS — E663 Overweight: Secondary | ICD-10-CM

## 2014-01-11 DIAGNOSIS — I2699 Other pulmonary embolism without acute cor pulmonale: Secondary | ICD-10-CM

## 2014-01-11 NOTE — Patient Instructions (Signed)
Your Blood Pressure Looks Great!!. Keep up the good work with exercises!!  Please ask Dr. Raliegh Ip to forward the labs to me when he has them checked.  Leonie Man, MD  Your physician wants you to follow-up in: 1 yr. You will receive a reminder letter in the mail two months in advance. If you don't receive a letter, please call our office to schedule the follow-up appointment.

## 2014-01-11 NOTE — Progress Notes (Signed)
PATIENT: Matthew Trujillo MRN: [id number]  DOB: 03/15/1951   DOV:01/13/2014 PCP: Nyoka Cowden, MD  Clinic Note: Chief Complaint  Patient presents with  . Annual Exam    no chest pain ,no sob, no edema   HPI: Matthew Trujillo is a 63 y.o.  male with a PMH below who presents today for annual followup of hypertension.  Interval History: Matthew Trujillo seems to be doing quite well overall. He continues to walk roughly 2-3 miles per day for his week with intent to get about 12 miles and per week. He denies any chest tightness/pressure or dyspnea with rest or exertion. He denies any lightheadedness, dizziness, wooziness, the syncope or near syncope. No irregular or rapid heartbeats. No TIA or amaurosis fugax symptoms. No melena, hematochezia or hematuria. No claudication or cramping.  Past Medical History  Diagnosis Date  . ALLERGIC RHINITIS 05/21/2010  . ASTHMA 05/21/2010  . HYPERTENSION 05/21/2010    Diffiult to control  . PULMONARY EMBOLISM 05/21/2010  . Factor V Leiden    CARDIAC EVALUATION TO DATE: 1. Echocardiogram January 2013 with an elevated blood pressure of 160/96. Mild concentric LVH/concentric remodeling. Mildly dilated left atrium with diastolic dysfunction, but likely normal for age. 2. Renal artery Dopplers, normal flow velocities.   No Known Allergies  Current Outpatient Prescriptions  Medication Sig Dispense Refill  . albuterol (VENTOLIN HFA) 108 (90 BASE) MCG/ACT inhaler Inhale 2 puffs into the lungs every 6 (six) hours as needed.  1 Inhaler  6  . amLODipine (NORVASC) 10 MG tablet Take 10 mg by mouth daily.      Marland Kitchen aspirin 325 MG tablet Take 325 mg by mouth daily.        . Calcium Carbonate-Vitamin D (CALTRATE 600+D) 600-400 MG-UNIT per tablet Take 1 tablet by mouth daily.        . chlorthalidone (HYGROTON) 50 MG tablet Take 1 tablet (50 mg total) by mouth daily. <please schedule appointment>  90 tablet  0  . montelukast (SINGULAIR) 10 MG tablet Take 10 mg by mouth  daily as needed.       . Multiple Vitamin (MULTIVITAMIN) tablet Take 1 tablet by mouth daily.        . Omega-3 Fatty Acids (FISH OIL) 1000 MG CAPS Take by mouth 2 (two) times daily.       . ramipril (ALTACE) 10 MG capsule Take 10 mg by mouth daily.         Current Facility-Administered Medications  Medication Dose Route Frequency Provider Last Rate Last Dose  . albuterol (PROVENTIL) (2.5 MG/3ML) 0.083% nebulizer solution 5 mg  5 mg Nebulization Once Ellison Carwin, MD      . ipratropium (ATROVENT) nebulizer solution 0.5 mg  0.5 mg Nebulization Once Ellison Carwin, MD        History   Social History Narrative   He is a married father of 2. He does exercise about 3-4 days a week with no true symptoms. Does not smoke and occasional social alcohol.    ROS: A comprehensive Review of Systems - Negative  PHYSICAL EXAM BP 110/78  Pulse 66  Ht 6\' 2"  (1.88 m)  Wt 231 lb 9.6 oz (105.053 kg)  BMI 29.72 kg/m2 General appearance: alert, cooperative, appears stated age, no distress and Healthy-appearing, well-nourished and well-groomed. Neck: no carotid bruit, no JVD, supple, symmetrical, trachea midline and thyroid not enlarged, symmetric, no tenderness/mass/nodules Lungs: clear to auscultation bilaterally, normal percussion bilaterally and Nonlabored, good air movement Heart: regular rate and rhythm,  S1, S2 normal, no murmur, click, rub or gallop and normal apical impulse Abdomen: soft, non-tender; bowel sounds normal; no masses,  no organomegaly Extremities: extremities normal, atraumatic, no cyanosis or edema Pulses: 2+ and symmetric Neurologic: Alert and oriented X 3, normal strength and tone. Normal symmetric reflexes. Normal coordination and gait  LYY:[id number] today: Yes Rate: 66 , Rhythm: NSR; normal EKG  Recent Labs: Due for labs next month  ASSESSMENT / PLAN: HYPERTENSION Well-controlled on current regimen. Chlorthalidone seems to be working quite well for him.  PULMONARY  EMBOLISM No longer on warfarin. He is on simply 325 mg of aspirin. I think he can probably safely be decreased at least 80 one twice a day.  Overweight (BMI 25.0-29.9) He is due to see his PCP soon. Bili and all labs checked. It sounds like his cholesterol control been pretty good.    Orders Placed This Encounter  Procedures  . EKG 12-Lead   Meds ordered this encounter  Medications  . amLODipine (NORVASC) 10 MG tablet    Sig: Take 10 mg by mouth daily.    Followup: One year  DAVID W. Ellyn Hack, M.D., M.S. [facility] [address] [place], [place]  [address]  [phone] Pager # [phone]

## 2014-01-13 DIAGNOSIS — E66811 Obesity, class 1: Secondary | ICD-10-CM | POA: Insufficient documentation

## 2014-01-13 DIAGNOSIS — E669 Obesity, unspecified: Secondary | ICD-10-CM | POA: Insufficient documentation

## 2014-01-13 NOTE — Assessment & Plan Note (Signed)
No longer on warfarin. He is on simply 325 mg of aspirin. I think he can probably safely be decreased at least 80 one twice a day.

## 2014-01-13 NOTE — Assessment & Plan Note (Signed)
He is due to see his PCP soon. Bili and all labs checked. It sounds like his cholesterol control been pretty good.

## 2014-01-13 NOTE — Assessment & Plan Note (Addendum)
Well-controlled on current regimen. Chlorthalidone seems to be working quite well for him.

## 2014-03-04 ENCOUNTER — Other Ambulatory Visit: Payer: Self-pay | Admitting: Cardiology

## 2014-03-04 MED ORDER — CHLORTHALIDONE 50 MG PO TABS: 50.0000 mg | ORAL_TABLET | Freq: Every day | ORAL | Status: DC

## 2014-03-04 NOTE — Telephone Encounter (Signed)
Rx was sent to pharmacy electronically. 

## 2014-03-09 ENCOUNTER — Other Ambulatory Visit: Payer: Self-pay | Admitting: Cardiology

## 2014-03-11 NOTE — Telephone Encounter (Signed)
Rx was sent to pharmacy electronically. 

## 2014-05-16 ENCOUNTER — Other Ambulatory Visit: Payer: Self-pay

## 2014-05-16 MED ORDER — AMLODIPINE BESYLATE 10 MG PO TABS: 10.0000 mg | ORAL_TABLET | Freq: Every day | ORAL | Status: DC

## 2014-05-16 NOTE — Telephone Encounter (Signed)
Rx was sent to pharmacy electronically. 

## 2014-10-29 ENCOUNTER — Telehealth: Payer: Self-pay | Admitting: Cardiology

## 2014-10-31 NOTE — Telephone Encounter (Signed)
Close encounter 

## 2015-02-03 ENCOUNTER — Ambulatory Visit: Payer: BC Managed Care – PPO | Admitting: Cardiology

## 2015-02-10 ENCOUNTER — Other Ambulatory Visit: Payer: Self-pay | Admitting: Cardiology

## 2015-02-10 NOTE — Telephone Encounter (Signed)
Rx(s) sent to pharmacy electronically. OV 03/13/15

## 2015-02-13 ENCOUNTER — Telehealth: Payer: Self-pay | Admitting: Cardiology

## 2015-02-13 MED ORDER — AMLODIPINE BESYLATE 10 MG PO TABS: 10.0000 mg | ORAL_TABLET | Freq: Every day | ORAL | Status: DC

## 2015-02-13 NOTE — Telephone Encounter (Signed)
°  1. Which medications need to be refilled? Amlodipine  2. Which pharmacy is medication to be sent to?[facility] [place]  3. Do they need a 30 day or 90 day supply? 90 and refills  4. Would they like a call back once the medication has been sent to the pharmacy? yes

## 2015-02-13 NOTE — Telephone Encounter (Signed)
Rx(s) sent to pharmacy electronically.  

## 2015-03-13 ENCOUNTER — Ambulatory Visit: Payer: Self-pay | Admitting: Cardiology

## 2015-05-24 ENCOUNTER — Other Ambulatory Visit: Payer: Self-pay | Admitting: Cardiology

## 2015-05-27 NOTE — Telephone Encounter (Signed)
Rx(s) sent to pharmacy electronically. OV 06/12/15

## 2015-06-12 ENCOUNTER — Ambulatory Visit (INDEPENDENT_AMBULATORY_CARE_PROVIDER_SITE_OTHER): Payer: BLUE CROSS/BLUE SHIELD | Admitting: Cardiology

## 2015-06-12 ENCOUNTER — Encounter: Payer: Self-pay | Admitting: Cardiology

## 2015-06-12 VITALS — BP 152/76 | HR 71 | Ht 74.0 in | Wt 234.3 lb

## 2015-06-12 DIAGNOSIS — E663 Overweight: Secondary | ICD-10-CM

## 2015-06-12 DIAGNOSIS — I10 Essential (primary) hypertension: Secondary | ICD-10-CM | POA: Diagnosis not present

## 2015-06-12 DIAGNOSIS — Z86711 Personal history of pulmonary embolism: Secondary | ICD-10-CM | POA: Diagnosis not present

## 2015-06-12 NOTE — Patient Instructions (Signed)
PLEASE TRY TO GET BACK TO EXERCISING.   CONTINUE WITH CURRENT MEDICATIONS.   Your physician wants you to follow-up in [place].  You will receive a reminder letter in the mail two months in advance. If you don't receive a letter, please call our office to schedule the follow-up appointment.

## 2015-06-12 NOTE — Progress Notes (Signed)
PATIENT: Matthew Trujillo MRN: [id number]  DOB: 1951-06-22   DOV:06/14/2015 PCP: Nyoka Cowden, MD  Clinic Note: Chief Complaint  Patient presents with  . Follow-up    no chest pain, no shortness of breath, no edema, no pain in legs, no cramping in legs, no lightheadedness, no dizziness   HPI: Matthew Trujillo is a 64 y.o.  male with a PMH below who presents today for annual followup of hypertension and mild venous stasis  Edema. Last seen Jan 2015.   Since I last saw him, his job was changed quite a bit to where he is now having to do a significant amount of [place] travel. This is Different from his usual workout routine.  Interval History: Matthew Trujillo seems to be doing quite well overall. He used to walk roughly 2-3 miles per day for his week with intent to get about 12 miles and per week, but has gotten out of the routine due to work related travel. ~ 2 months ago had ~2 hr of feeling "lightheaded".   Otherwise no symptoms.  He denies any chest tightness/pressure or dyspnea with rest or exertion. He denies any lightheadedness, dizziness, wooziness, the syncope or near syncope. No irregular or rapid heartbeats. No TIA or amaurosis fugax symptoms. No melena, hematochezia or hematuria. No claudication or cramping.  Past Medical History  Diagnosis Date  . ALLERGIC RHINITIS 05/21/2010  . ASTHMA 05/21/2010  . HYPERTENSION 05/21/2010    Diffiult to control  . PULMONARY EMBOLISM 05/21/2010  . Factor V Leiden    CARDIAC EVALUATION TO DATE: 1. Echocardiogram January 2013 with an elevated blood pressure of 160/96. Mild concentric LVH/concentric remodeling. Mildly dilated left atrium with diastolic dysfunction, but likely normal for age. 2. Renal artery Dopplers, normal flow velocities.   No Known Allergies  Current Outpatient Prescriptions  Medication Sig Dispense Refill  . albuterol (VENTOLIN HFA) 108 (90 BASE) MCG/ACT inhaler Inhale 2 puffs into the lungs every 6 (six) hours as  needed. 1 Inhaler 6  . amLODipine (NORVASC) 10 MG tablet Take 1 tablet (10 mg total) by mouth daily. NEED APPOINTMENT FOR FUTURE REFILLS 90 tablet 0  . aspirin 325 MG tablet Take 325 mg by mouth daily.      . Calcium Carbonate-Vitamin D (CALTRATE 600+D) 600-400 MG-UNIT per tablet Take 1 tablet by mouth daily.      . chlorthalidone (HYGROTON) 50 MG tablet Take 1 tablet (50 mg total) by mouth daily. 90 tablet 3  . montelukast (SINGULAIR) 10 MG tablet Take 10 mg by mouth daily as needed.     . Multiple Vitamin (MULTIVITAMIN) tablet Take 1 tablet by mouth daily.      . Omega-3 Fatty Acids (FISH OIL) 1000 MG CAPS Take by mouth 2 (two) times daily.     . ramipril (ALTACE) 10 MG capsule Take 10 mg by mouth daily.       Current Facility-Administered Medications  Medication Dose Route Frequency Provider Last Rate Last Dose  . albuterol (PROVENTIL) (2.5 MG/3ML) 0.083% nebulizer solution 5 mg  5 mg Nebulization Once Roselee Culver, MD      . ipratropium (ATROVENT) nebulizer solution 0.5 mg  0.5 mg Nebulization Once Roselee Culver, MD        History   Social History Narrative   He is a married father of 2. He does exercise about 3-4 days a week with no true symptoms. Does not smoke and occasional social alcohol.    ROS: A comprehensive Review of Systems -  Negative  Review of Systems  Constitutional: Negative for malaise/fatigue.  HENT: Negative for nosebleeds.   Respiratory: Negative for shortness of breath and wheezing.   Cardiovascular: Positive for leg swelling ( relatively stable.. Wears compression stockings with  plane travel.). Negative for claudication.        Per history of present illness  Gastrointestinal: Negative for blood in stool and melena.  Genitourinary: Negative for hematuria.  Musculoskeletal: Positive for joint pain. Negative for myalgias and falls.  Neurological: Negative for dizziness.  Endo/Heme/Allergies: Does not bruise/bleed easily.  Psychiatric/Behavioral:  Negative for depression. The patient is not nervous/anxious.   All other systems reviewed and are negative.  Wt Readings from Last 3 Encounters:  06/12/15 106.278 kg (234 lb 4.8 oz)  01/11/14 105.053 kg (231 lb 9.6 oz)  12/21/13 103.511 kg (228 lb 3.2 oz)   PHYSICAL EXAM BP 152/76 mmHg  Pulse 71  Ht 6\' 2"  (1.88 m)  Wt 106.278 kg (234 lb 4.8 oz)  BMI 30.07 kg/m2:  130/86 mmHg on my check General appearance: alert, cooperative, appears stated age, no distress and Healthy-appearing, well-nourished and well-groomed. Neck: no carotid bruit, no JVD, supple, symmetrical, trachea midline and thyroid not enlarged, symmetric, no tenderness/mass/nodules Lungs: clear to auscultation bilaterally, normal percussion bilaterally and Nonlabored, good air movement Heart: regular rate and rhythm, S1, S2 normal, no murmur, click, rub or gallop and normal apical impulse Abdomen: soft, non-tender; bowel sounds normal; no masses,  no organomegaly Extremities: extremities normal, atraumatic, no cyanosis or edema Pulses: 2+ and symmetric Neurologic: Alert and oriented X 3, normal strength and tone. Normal symmetric reflexes. Normal coordination and gait  [id number] today: Yes Rate:  71, Rhythm: NSR; normal EKG  (normal axis, intervals and durations)  Other studies Reviewed: Additional studies/ records that were reviewed today include:  Review of the above records demonstrates:   Recent Labs: Due for labs by PCP next month.  ASSESSMENT / PLAN: Problem List Items Addressed This Visit    Essential hypertension - Primary (Chronic)     Was relatively high when he first came in this morning , however on recheck his pressure was better. Remains on amlodipine ,   ACE inhibitor and chlorthalidone. I think with his decreased exercise , his blood pressure will start to go up.   I just recommended that he gets back into exercising however he can. Think we have lost some ground. He has gained weight , and  therefore his blood pressure is higher.   He is pretty much of max dose of his 3 medications, I'm reluctant to add a new medication.      Relevant Orders   EKG 12-Lead (Completed)   History of pulmonary embolism     He did have a DVT , and is prone for venous stasis /venous insufficiency. Wears compression stockings on  Long flights. Pretty much with whatever travel his unit. This helps edema but also helps prevent EVT. I then made sure the knows that he needs to get up walk around during long flights.   Longer on full  anticoagulation.      Overweight (BMI 25.0-29.9) (Chronic)     He actually just got into the obese category barely. Discussed importance of watching what he eats when he is traveling and also trying to make sure he gets exercise. They increased weight is affect his blood pressure.      Relevant Orders   EKG 12-Lead (Completed)     Current medicines are reviewed at length with  the patient today. No concerns The following changes have been made:No change labs/ tests ordered today include:   Orders Placed This Encounter  Procedures  . EKG 12-Lead   No orders of the defined types were placed in this encounter.    Followup: One year   Leonie Man, M.D., M.S. Interventional Cardiologist   Pager # [phone]

## 2015-06-14 NOTE — Assessment & Plan Note (Signed)
He did have a DVT , and is prone for venous stasis /venous insufficiency. Wears compression stockings on  Long flights. Pretty much with whatever travel his unit. This helps edema but also helps prevent EVT. I then made sure the knows that he needs to get up walk around during long flights.   Longer on full  anticoagulation.

## 2015-06-14 NOTE — Assessment & Plan Note (Signed)
Was relatively high when he first came in this morning , however on recheck his pressure was better. Remains on amlodipine ,   ACE inhibitor and chlorthalidone. I think with his decreased exercise , his blood pressure will start to go up.   I just recommended that he gets back into exercising however he can. Think we have lost some ground. He has gained weight , and therefore his blood pressure is higher.   He is pretty much of max dose of his 3 medications, I'm reluctant to add a new medication.

## 2015-06-14 NOTE — Assessment & Plan Note (Signed)
He actually just got into the obese category barely. Discussed importance of watching what he eats when he is traveling and also trying to make sure he gets exercise. They increased weight is affect his blood pressure.

## 2015-07-01 ENCOUNTER — Other Ambulatory Visit: Payer: Self-pay | Admitting: *Deleted

## 2015-07-01 MED ORDER — AMLODIPINE BESYLATE 10 MG PO TABS: 10.0000 mg | ORAL_TABLET | Freq: Every day | ORAL | Status: DC

## 2015-09-17 ENCOUNTER — Other Ambulatory Visit: Payer: Self-pay | Admitting: Cardiology

## 2015-09-17 MED ORDER — CHLORTHALIDONE 50 MG PO TABS: 50.0000 mg | ORAL_TABLET | Freq: Every day | ORAL | Status: DC

## 2015-09-17 NOTE — Telephone Encounter (Signed)
°  1. Which medications need to be refilled? Chlorthalidone  2. Which pharmacy is medication to be [facility] 3. Do they need a 30 day or 90 day supply? 90 and refills  4. Would they like a call back once the medication has been sent to the pharmacy? yes

## 2015-09-17 NOTE — Telephone Encounter (Signed)
Chlorthalidone refilled electronically #90 w/2 refills.

## 2015-10-06 ENCOUNTER — Other Ambulatory Visit: Payer: Self-pay | Admitting: Cardiology

## 2015-10-06 NOTE — Telephone Encounter (Signed)
REFILL 

## 2015-10-17 ENCOUNTER — Other Ambulatory Visit: Payer: Self-pay | Admitting: Cardiology

## 2015-10-24 ENCOUNTER — Other Ambulatory Visit: Payer: Self-pay | Admitting: Cardiology

## 2015-12-16 ENCOUNTER — Other Ambulatory Visit: Payer: Self-pay

## 2015-12-16 MED ORDER — CHLORTHALIDONE 25 MG PO TABS: 50.0000 mg | ORAL_TABLET | Freq: Every day | ORAL | Status: DC

## 2016-02-18 ENCOUNTER — Encounter: Payer: Self-pay | Admitting: Internal Medicine

## 2016-06-23 ENCOUNTER — Other Ambulatory Visit: Payer: Self-pay | Admitting: Cardiology

## 2016-06-23 NOTE — Telephone Encounter (Signed)
Rx(s) sent to pharmacy electronically.  

## 2016-08-25 ENCOUNTER — Encounter: Payer: Self-pay | Admitting: Cardiology

## 2016-08-25 ENCOUNTER — Ambulatory Visit (INDEPENDENT_AMBULATORY_CARE_PROVIDER_SITE_OTHER): Payer: BLUE CROSS/BLUE SHIELD | Admitting: Cardiology

## 2016-08-25 VITALS — BP 133/77 | HR 71 | Ht 73.0 in | Wt 236.4 lb

## 2016-08-25 DIAGNOSIS — E669 Obesity, unspecified: Secondary | ICD-10-CM

## 2016-08-25 DIAGNOSIS — I10 Essential (primary) hypertension: Secondary | ICD-10-CM | POA: Diagnosis not present

## 2016-08-25 DIAGNOSIS — Z86711 Personal history of pulmonary embolism: Secondary | ICD-10-CM

## 2016-08-25 NOTE — Patient Instructions (Signed)
No change to current medications except: Take either amlodipine or ramipril at lunch time   Your physician wants you to follow-up in:12 months with Dr Ellyn Hack.You will receive a reminder letter in the mail two months in advance. If you don't receive a letter, please call our office to schedule the follow-up appointment.   If you need a refill on your cardiac medications before your next appointment, please call your pharmacy.

## 2016-08-25 NOTE — Progress Notes (Addendum)
PATIENT: Matthew Trujillo MRN: [id number]  DOB: 1951-09-27   DOV:08/27/2016 PCP: Nyoka Cowden, MD  Clinic Note: Chief Complaint  Patient presents with  . Follow-up    Pt states no Sx or concerns. Pt states he is doing well.   HPI: Matthew Trujillo is a 65 y.o.  male with a PMH below who presents today for annual followup of hypertension and mild venous stasis  Edema. Last seen June 2016 -- he noted that his job was changed quite a bit to where he is now having to do a significant amount of overseas travel. This was Different from his usual workout routine.   -- Still doing overseas work.    No recent studies No recent hospitalizations  Interval History: [place] to do well. He still frequently traveling back and forth overseas to [place]. Unfortunately as a result, he has not been able to exercise as much as he usually would. He does go on hikes on weekends. However he spends a lot of time sitting in meetings all day long. He also notes that the food that he is eating much richer than here and therefore is gained some weight. He says that for the first couple days upon arriving overseas, he will have some swelling mostly continues to wear the support stockings while on the flights and for the first couple days after arrival. Otherwise he is not really have much in way problems with his edema. He really denies any dyspnea with the exertion, no studies of a sore from prolonged hiking. No rapid irregular heartbeats or palpitations. No chest pain or dyspnea with rest or exertion. No PND, orthopnea to go along with his chronic mild edema. Says his blood pressure is a little higher in the afternoon than in the morning. Discussed simply taking one of his blood pressure medications (either amlodipine or ramipril) and lunch instead of in the morning.  He denies any lightheadedness, dizziness, wooziness, the syncope or near syncope.  No TIA or amaurosis fugax symptoms. No melena,  hematochezia or hematuria. No claudication or cramping.  Past Medical History:  Diagnosis Date  . ALLERGIC RHINITIS 05/21/2010  . ASTHMA 05/21/2010  . Factor V Leiden ([place])   . HYPERTENSION 05/21/2010   Diffiult to control  . PULMONARY EMBOLISM 05/21/2010   Past Surgical History:  Procedure Laterality Date  . ACHILLES TENDON REPAIR    . DOPPLER ECHOCARDIOGRAPHY  12/30/2011   EF >55%; MILD LVH,MILD LAE,NORMAL DIASTOLIC FUNCTION  . RENAL DOPPLER  12/30/2011   NORMAL RENAL    No Known Allergies  Current Outpatient Prescriptions  Medication Sig Dispense Refill  . albuterol (VENTOLIN HFA) 108 (90 BASE) MCG/ACT inhaler Inhale 2 puffs into the lungs every 6 (six) hours as needed. 1 Inhaler 6  . amLODipine (NORVASC) 10 MG tablet TAKE 1 TABLET BY MOUTH EVERY DAY 90 tablet 0  . aspirin 325 MG tablet Take 325 mg by mouth daily.      . Calcium Carbonate-Vitamin D (CALTRATE 600+D) 600-400 MG-UNIT per tablet Take 1 tablet by mouth daily.      . chlorthalidone (HYGROTON) 25 MG tablet Take 2 tablets (50 mg total) by mouth daily. 180 tablet 0  . chlorthalidone (HYGROTON) 50 MG tablet TAKE 1 TABLET DAILY 90 tablet 3  . chlorthalidone (HYGROTON) 50 MG tablet Take 1 tablet (50 mg total) by mouth daily. 90 tablet 2  . diclofenac (VOLTAREN) 50 MG EC tablet Take 50 mg by mouth 2 (two) times daily.    Marland Kitchen  montelukast (SINGULAIR) 10 MG tablet Take 10 mg by mouth daily as needed.     . Multiple Vitamin (MULTIVITAMIN) tablet Take 1 tablet by mouth daily.      . Omega-3 Fatty Acids (FISH OIL) 1000 MG CAPS Take by mouth 2 (two) times daily.     . ramipril (ALTACE) 10 MG capsule Take 10 mg by mouth daily.       Current Facility-Administered Medications  Medication Dose Route Frequency Provider Last Rate Last Dose  . albuterol (PROVENTIL) (2.5 MG/3ML) 0.083% nebulizer solution 5 mg  5 mg Nebulization Once Roselee Culver, MD      . ipratropium (ATROVENT) nebulizer solution 0.5 mg  0.5 mg Nebulization Once  Roselee Culver, MD        Social History   Social History Narrative   He is a married father of 2. He does exercise about 3-4 days a week with no true symptoms. Does not smoke and occasional social alcohol.    ROS: A comprehensive Review of Systems - Negative  Review of Systems  Constitutional: Negative.  Negative for malaise/fatigue.  HENT: Negative for nosebleeds.   Respiratory: Negative for cough, shortness of breath and wheezing.   Cardiovascular: Positive for leg swelling ( relatively stable.. Wears compression stockings with  plane travel.). Negative for palpitations and claudication.        Per history of present illness  Gastrointestinal: Negative for blood in stool and melena.  Genitourinary: Negative for hematuria.  Musculoskeletal: Positive for joint pain. Negative for falls and myalgias.  Neurological: Negative for dizziness and headaches.  Endo/Heme/Allergies: Positive for environmental allergies (uses seasonal meds). Does not bruise/bleed easily.  Psychiatric/Behavioral: Negative for depression. The patient is not nervous/anxious.   All other systems reviewed and are negative.   Wt Readings from Last 3 Encounters:  08/25/16 236 lb 6.4 oz (107.2 kg)  06/12/15 234 lb 4.8 oz (106.3 kg)  01/11/14 231 lb 9.6 oz (105.1 kg)   PHYSICAL EXAM BP 133/77   Pulse 71   Ht 6\' 1"  (1.854 m)   Wt 236 lb 6.4 oz (107.2 kg)   BMI 31.19 kg/m :   General appearance: alert, cooperative, appears stated age, no distress and Healthy-appearing, well-nourished and well-groomed. Neck: no carotid bruit, no JVD, supple, symmetrical, trachea midline and thyroid not enlarged, symmetric, no tenderness/mass/nodules Lungs: clear to auscultation bilaterally, normal percussion bilaterally and Nonlabored, good air movement Heart: regular rate and rhythm, S1, S2 normal, no murmur, click, rub or gallop and normal apical impulse Abdomen: soft, non-tender; bowel sounds normal; no masses,  no  organomegaly Extremities: extremities normal, atraumatic, no cyanosis or edema Pulses: 2+ and symmetric Neurologic: Alert and oriented X 3, normal strength and tone. Normal symmetric reflexes. Normal coordination and gait  [id number] today: Yes Rate:  71 , Rhythm: NSR; normal EKG  (normal axis, intervals and durations)  Other studies Reviewed: Additional studies/ records that were reviewed today include:  Review of the above records demonstrates:   Recent Labs: Due for labs by PCP next month.  ASSESSMENT / PLAN: Problem List Items Addressed This Visit    Obesity (BMI 30.0-34.9) (Chronic)    Now remains in the obese category. Handicap because of his work schedule. These finding it difficult to find time to exercise. Also difficult to watch his diet and eating rich foods from [place].  Encouraged him to continue to watch his diet, eat foods in moderation.  I also recommend that he find some time to do some walking  the course of the day. Continue hiking.      History of pulmonary embolism - Primary    History of DVT with venous stasis insufficiency. Wears his compression stockings on long flights. Usually swelling is relieved with chlorthalidone. No longer on anticoagulation      Relevant Orders   EKG 12-Lead (Completed)   Essential hypertension (Chronic)    Blood pressure looks pretty good today. He says he has high blood pressures in the evenings. Plan would be to take either amlodipine or ramipril at lunchtime. Continue to exercise more routinely. That should help his blood pressure. Also monitoring his dietary intake and weight gain well.      Relevant Orders   EKG 12-Lead (Completed)    Other Visit Diagnoses   None.    Current medicines are reviewed at length with the patient today. No concerns The following changes have been made:No change No change to current medications except: Take either amlodipine or ramipril at lunch time  labs/ tests ordered today  include:   Orders Placed This Encounter  Procedures  . EKG 12-Lead    Order Specific Question:   Where should this test be performed    Answer:   Other     Followup: 12 months with Dr Ellyn Hack.   Glenetta Hew, M.D., M.S. Interventional Cardiologist   Pager # [phone]   Recent Labs: From PCP 08/16/2016  Na+ 138, K+ 3.8, Cl- 98 , BUN 18, Cr 0.90, Glu 99, Ca2+ 9.6; AST 28, ALT 54, GGT 29, AlkP 53, Alb 4.9, TP 7.5, T Bili 0.7; uric acid 3.6  CBC: W 11.2, H/H 16.3/47.6; Plt 315  TC 199, TG 143, HDL 40, LDL 130  TSH 1.9, T4 7 0.9, T3 uptake 24, free thyroxine 1.9. PSA 1.3.

## 2016-08-27 ENCOUNTER — Encounter: Payer: Self-pay | Admitting: Cardiology

## 2016-08-27 NOTE — Assessment & Plan Note (Addendum)
Now remains in the obese category. Handicap because of his work schedule. These finding it difficult to find time to exercise. Also difficult to watch his diet and eating rich foods from [place].  Encouraged him to continue to watch his diet, eat foods in moderation.  I also recommend that he find some time to do some walking the course of the day. Continue hiking.

## 2016-08-27 NOTE — Assessment & Plan Note (Signed)
History of DVT with venous stasis insufficiency. Wears his compression stockings on long flights. Usually swelling is relieved with chlorthalidone. No longer on anticoagulation

## 2016-08-27 NOTE — Assessment & Plan Note (Signed)
Blood pressure looks pretty good today. He says he has high blood pressures in the evenings. Plan would be to take either amlodipine or ramipril at lunchtime. Continue to exercise more routinely. That should help his blood pressure. Also monitoring his dietary intake and weight gain well.

## 2016-09-19 ENCOUNTER — Other Ambulatory Visit: Payer: Self-pay | Admitting: Cardiology

## 2016-09-20 NOTE — Telephone Encounter (Signed)
Rx request sent to pharmacy.  

## 2016-10-30 ENCOUNTER — Ambulatory Visit (INDEPENDENT_AMBULATORY_CARE_PROVIDER_SITE_OTHER): Payer: BLUE CROSS/BLUE SHIELD | Admitting: Physician Assistant

## 2016-10-30 VITALS — BP 140/80 | HR 77 | Temp 98.2°F | Resp 18 | Ht 74.0 in | Wt 235.8 lb

## 2016-10-30 DIAGNOSIS — R0789 Other chest pain: Secondary | ICD-10-CM

## 2016-10-30 DIAGNOSIS — R05 Cough: Secondary | ICD-10-CM | POA: Diagnosis not present

## 2016-10-30 DIAGNOSIS — R059 Cough, unspecified: Secondary | ICD-10-CM

## 2016-10-30 MED ORDER — IPRATROPIUM BROMIDE 0.02 % IN SOLN
0.5000 mg | Freq: Once | RESPIRATORY_TRACT | Status: AC
  Administered 2016-10-30: 0.5 mg via RESPIRATORY_TRACT

## 2016-10-30 MED ORDER — HYDROCODONE-HOMATROPINE 5-1.5 MG/5ML PO SYRP: 5.0000 mL | ORAL_SOLUTION | Freq: Three times a day (TID) | ORAL | 0 refills | Status: DC | PRN

## 2016-10-30 MED ORDER — MUCINEX DM MAXIMUM STRENGTH 60-1200 MG PO TB12: 1.0000 | ORAL_TABLET | Freq: Two times a day (BID) | ORAL | 0 refills | Status: DC

## 2016-10-30 MED ORDER — ALBUTEROL SULFATE (2.5 MG/3ML) 0.083% IN NEBU
2.5000 mg | INHALATION_SOLUTION | Freq: Once | RESPIRATORY_TRACT | Status: AC
  Administered 2016-10-30: 2.5 mg via RESPIRATORY_TRACT

## 2016-10-30 NOTE — Patient Instructions (Addendum)
  Use your Albuterol inhaler for the next 24 hours: 2 puffs every 4-6 hours.  Hycodan cough syrup for cough suppression at bedtime. Mucinex to breakup your phlegm as directed.   Please stay well hydrated to help clear this.  Have a great trip to [place]!   Thank you for coming in today. I hope you feel we met your needs.  Feel free to call [facility] if you have any questions or further requests.  Please consider signing up for MyChart if you do not already have it, as this is a great way to communicate with me.  Best,  Whitney McVey, PA-C   IF you received an x-ray today, you will receive an invoice from [facility]. Please contact [facility] at [phone] with questions or concerns regarding your invoice.   IF you received labwork today, you will receive an invoice from [employer]. Please contact [facility] at [phone] with questions or concerns regarding your invoice.   Our billing staff will not be able to assist you with questions regarding bills from these companies.  You will be contacted with the lab results as soon as they are available. The fastest way to get your results is to activate your My Chart account. Instructions are located on the last page of this paperwork. If you have not heard from [place] regarding the results in 2 weeks, please contact this office.

## 2016-10-30 NOTE — Progress Notes (Signed)
Matthew Trujillo  MRN: [id number] DOB: 07-25-1951  PCP: Nyoka Cowden, MD  Subjective:  Pt is a pleasant 65 year old male who presents to clinic for SOB x one day.  He reports difficulty taking full deep breath and a productive cough. Started last night. Tried Robitussin, didn't work. Cough kept him awake.   Denies chest pain/pressure, congestion, fever, chills, nausea, vomiting, apnea. History of Asthma - usually exercise induced asthma.  Uses rescue inhaler as needed.  Is traveling to [place] tomorrow for one week on a business trip and is nervous about his SOB.   Review of Systems  Constitutional: Negative for chills, diaphoresis, fatigue and fever.  HENT: Negative for congestion, postnasal drip, rhinorrhea, sinus pain and sore throat.   Respiratory: Positive for cough, shortness of breath and wheezing. Negative for chest tightness.   Cardiovascular: Negative for chest pain, palpitations and leg swelling.  Gastrointestinal: Negative for diarrhea, nausea and vomiting.  Neurological: Negative for dizziness, syncope, light-headedness and headaches.  Psychiatric/Behavioral: Positive for sleep disturbance. The patient is not nervous/anxious.     Patient Active Problem List   Diagnosis Date Noted  . Obesity (BMI 30.0-34.9) 01/13/2014  . Essential hypertension 05/21/2010  . History of pulmonary embolism 05/21/2010  . ALLERGIC RHINITIS 05/21/2010  . ASTHMA 05/21/2010    Current Outpatient Prescriptions on File Prior to Visit  Medication Sig Dispense Refill  . albuterol (VENTOLIN HFA) 108 (90 BASE) MCG/ACT inhaler Inhale 2 puffs into the lungs every 6 (six) hours as needed. 1 Inhaler 6  . amLODipine (NORVASC) 10 MG tablet TAKE 1 TABLET BY MOUTH EVERY DAY 90 tablet 3  . aspirin 325 MG tablet Take 325 mg by mouth daily.      . Calcium Carbonate-Vitamin D (CALTRATE 600+D) 600-400 MG-UNIT per tablet Take 1 tablet by mouth daily.      . chlorthalidone (HYGROTON) 50 MG  tablet Take 1 tablet (50 mg total) by mouth daily. 90 tablet 2  . montelukast (SINGULAIR) 10 MG tablet Take 10 mg by mouth daily as needed.     . Multiple Vitamin (MULTIVITAMIN) tablet Take 1 tablet by mouth daily.      . Omega-3 Fatty Acids (FISH OIL) 1000 MG CAPS Take by mouth 2 (two) times daily.     . ramipril (ALTACE) 10 MG capsule Take 10 mg by mouth daily.       Current Facility-Administered Medications on File Prior to Visit  Medication Dose Route Frequency Provider Last Rate Last Dose  . albuterol (PROVENTIL) (2.5 MG/3ML) 0.083% nebulizer solution 5 mg  5 mg Nebulization Once Roselee Culver, MD      . ipratropium (ATROVENT) nebulizer solution 0.5 mg  0.5 mg Nebulization Once Roselee Culver, MD        No Known Allergies  Objective:  BP 140/80 (BP Location: Right Arm, Patient Position: Sitting, Cuff Size: Large)   Pulse 77   Temp 98.2 F (36.8 C) (Oral)   Resp 18   Ht 6\' 2"  (1.88 m)   Wt 235 lb 12.8 oz (107 kg)   SpO2 98%   BMI 30.27 kg/m   Physical Exam  Constitutional: He is oriented to person, place, and time and well-developed, well-nourished, and in no distress. Vital signs are normal. No distress.  Cardiovascular: Normal rate, regular rhythm and normal heart sounds.   Pulmonary/Chest: Effort normal. No respiratory distress. He has wheezes in the right upper field, the left upper field and the left middle field. He has no  rales.  Neurological: He is alert and oriented to person, place, and time. GCS score is 15.  Skin: Skin is warm and dry.  Psychiatric: Mood, memory, affect and judgment normal.  Vitals reviewed.   Assessment and Plan :  1. Chest tightness 2. Cough - albuterol (PROVENTIL) (2.5 MG/3ML) 0.083% nebulizer solution 2.5 mg; Take 3 mLs (2.5 mg total) by nebulization once. - ipratropium (ATROVENT) nebulizer solution 0.5 mg; Take 2.5 mLs (0.5 mg total) by nebulization once. - Dextromethorphan-Guaifenesin (MUCINEX DM MAXIMUM STRENGTH) 60-1200 MG  TB12; Take 1 tablet by mouth every 12 (twelve) hours.  Dispense: 14 each; Refill: 0 - HYDROcodone-homatropine (HYCODAN) 5-1.5 MG/5ML syrup; Take 5 mLs by mouth every 8 (eight) hours as needed for cough.  Dispense: 120 mL; Refill: 0 - After breathing treatment patient reports much improvement. States his breathing "went from a 4 to a 9". Advised patient to use Albuterol q4-6 hours for the next 24 hours, then use it as needed. Stay well hydrated.  Mercer Pod, PA-C  [facility] 10/30/2016 10:56 AM

## 2016-12-08 ENCOUNTER — Other Ambulatory Visit: Payer: Self-pay | Admitting: Cardiology

## 2017-08-30 ENCOUNTER — Ambulatory Visit (INDEPENDENT_AMBULATORY_CARE_PROVIDER_SITE_OTHER): Payer: BLUE CROSS/BLUE SHIELD | Admitting: Cardiology

## 2017-08-30 VITALS — BP 138/74 | HR 64 | Ht 74.0 in | Wt 221.2 lb

## 2017-08-30 DIAGNOSIS — R600 Localized edema: Secondary | ICD-10-CM

## 2017-08-30 DIAGNOSIS — I10 Essential (primary) hypertension: Secondary | ICD-10-CM

## 2017-08-30 DIAGNOSIS — R002 Palpitations: Secondary | ICD-10-CM | POA: Diagnosis not present

## 2017-08-30 DIAGNOSIS — E0590 Thyrotoxicosis, unspecified without thyrotoxic crisis or storm: Secondary | ICD-10-CM | POA: Diagnosis not present

## 2017-08-30 MED ORDER — METOPROLOL TARTRATE 50 MG PO TABS: 50.0000 mg | ORAL_TABLET | Freq: Three times a day (TID) | ORAL | 11 refills | Status: DC

## 2017-08-30 NOTE — Progress Notes (Signed)
PCP: System, Pcp Not In  Clinic Note: Chief Complaint  Patient presents with  . Follow-up    pt denied chest pain, pt been diagnose with hyperthroid  . Palpitations    HPI: Matthew Trujillo is a 66 y.o. male with a PMH below who presents today for annual f/u for hypertension and venous stasis edema.  Matthew Trujillo was last seen on 08/25/2016. He was doing relatively well at that time. Noted swelling when traveling overseas (often travels to & from [place]) but otherwise is okay.  Recent Hospitalizations: none -- Recently diagnosed with Hyperthyroidism -- started on metoprolol & methimazole -- with initial diagnosis, was having palpitations & fatigue/ jitteriness. It took a while until he was seen by Endocrinology.   Studies Personally Reviewed - (if available, images/films reviewed: From Epic Chart or Care Everywhere)  none  Interval History: With the exception of his recent Hyperthyroid diagnosis he has been doing well.  Edema is stable -using support stockings & chlorthalidone.  He occasionally will have short spells of rapid HR with shortness of breath that occur out of the blue and are self limiting with a few deep breaths -- these spells started around the time of his thyroid diagnosis.  Rare dizzy spells, but no near syncope or syncope. No headache, nausea or vomiting, blurred vision.  No chest pain or shortness of breath with rest or exertion.  No PND, orthopnea or edema. No TIA/amaurosis fugax symptoms. No claudication.  ROS: A comprehensive was performed.  Most + symptoms are improving with Thyroid Rx.  Review of Systems  Constitutional: Positive for malaise/fatigue (Improving ).  HENT: Positive for sore throat (thyroid). Negative for congestion and nosebleeds.   Respiratory: Negative for cough, shortness of breath and wheezing.   Cardiovascular: Positive for palpitations (Heart Racign spells) and leg swelling (per HPI).       BP levels started increasing -  July-Aug  Gastrointestinal: Negative for blood in stool and melena.  Genitourinary: Negative for hematuria.  Musculoskeletal: Negative for back pain and joint pain.  Skin:       night sweats  Neurological: Positive for tremors (started in late May - June (hands)) and headaches.       Difficulty with concentration  Endo/Heme/Allergies: Positive for environmental allergies. Does not bruise/bleed easily.       Heat intolerance  Psychiatric/Behavioral: Negative for depression and memory loss.       With Thyroid Dz - more irritable  & easily agitated.  All other systems reviewed and are negative.  I have reviewed and (if needed) personally updated the patient's problem list, medications, allergies, past medical and surgical history, social and family history.   Past Medical History:  Diagnosis Date  . ALLERGIC RHINITIS 05/21/2010  . ASTHMA 05/21/2010  . Asthma   . Factor V Leiden ([place])   . HYPERTENSION 05/21/2010   Diffiult to control  . 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 ACHILLES TENDON REPAIR    . DOPPLER ECHOCARDIOGRAPHY  12/30/2011   EF >55%; MILD LVH,MILD LAE,NORMAL DIASTOLIC FUNCTION  . RENAL DOPPLER  12/30/2011   NORMAL RENAL    Current Meds  Medication Sig  . albuterol (VENTOLIN HFA) 108 (90 BASE) MCG/ACT inhaler Inhale 2 puffs into the lungs every 6 (six) hours as needed.  Marland Kitchen amLODipine (NORVASC) 10 MG tablet TAKE 1 TABLET BY MOUTH EVERY DAY  . Calcium Carbonate-Vitamin D (CALTRATE 600+D) 600-400 MG-UNIT per tablet Take 1 tablet  by mouth daily.    . chlorthalidone (HYGROTON) 50 MG tablet Take 1 tablet (50 mg total) by mouth daily.  . methimazole (TAPAZOLE) 5 MG tablet Take 5 mg by mouth 3 (three) times daily.  . montelukast (SINGULAIR) 10 MG tablet Take 10 mg by mouth daily as needed.   . Multiple Vitamin (MULTIVITAMIN) tablet Take 1 tablet by mouth daily.    . Omega-3 Fatty Acids (FISH OIL) 1000 MG CAPS Take by mouth 2  (two) times daily.   . ramipril (ALTACE) 10 MG capsule Take 10 mg by mouth daily.    . [DISCONTINUED] metoprolol tartrate (LOPRESSOR) 50 MG tablet Take 50 mg by mouth 2 (two) times daily.   Current Facility-Administered Medications for the 08/30/17 encounter (Office Visit) with Leonie Man, MD  Medication  . albuterol (PROVENTIL) (2.5 MG/3ML) 0.083% nebulizer solution 5 mg  . ipratropium (ATROVENT) nebulizer solution 0.5 mg    No Known Allergies  Social History   Social History  . Marital status: Married    Spouse name: N/A  . Number of children: N/A  . Years of education: N/A   Social History Main Topics  . Smoking status: Never Smoker  . Smokeless tobacco: Never Used  . Alcohol use Yes  . Drug use: No  . Sexual activity: Not on file   Other Topics Concern  . Not on file   Social History Narrative   He is a married father of 2. He does exercise about 3-4 days a week with no true symptoms. Does not smoke and occasional social alcohol.     family history includes Cancer in his brother and father; Hypertension in his mother.  Wt Readings from Last 3 Encounters:  08/30/17 221 lb 3.2 oz (100.3 kg)  10/30/16 235 lb 12.8 oz (107 kg)  08/25/16 236 lb 6.4 oz (107.2 kg)    PHYSICAL EXAM BP 138/74   Pulse 64   Ht 6\' 2"  (1.88 m)   Wt 221 lb 3.2 oz (100.3 kg)   BMI 28.40 kg/m  Physical Exam  Constitutional: He is oriented to person, place, and time. He appears well-developed and well-nourished. No distress.  well groomed.  HENT:  Head: Normocephalic and atraumatic.  Mouth/Throat: No oropharyngeal exudate.  Eyes: EOM are normal.  Neck: No hepatojugular reflux and no JVD present. Carotid bruit is not present.  Cardiovascular: Normal rate, regular rhythm, normal heart sounds and intact distal pulses.  Exam reveals no gallop and no friction rub.   No murmur heard. Pulmonary/Chest: Effort normal and breath sounds normal. No respiratory distress. He has no wheezes. He has  no rales.  Abdominal: Soft. Bowel sounds are normal. He exhibits no distension. There is no tenderness. There is no rebound and no guarding.  Musculoskeletal: He exhibits edema (~1+ bilateral).  Mild spider veins  Neurological: He is alert and oriented to person, place, and time. No cranial nerve deficit.  Skin: Skin is warm and dry. No rash noted. No erythema.  Psychiatric: He has a normal mood and affect. His behavior is normal. Judgment and thought content normal.  Nursing note and vitals reviewed.    Adult ECG Report  Rate: 64 ;  Rhythm: normal sinus rhythm and LVH. Otherwise normal axis, intervals & durations.;   Narrative Interpretation: stable EKG  Other studies Reviewed: Additional studies/ records that were reviewed today include:  Recent Labs:   Na+ 137, K+ 4.1, Cl- 102,, BUN 21, Cr 0.78, Glu 97, Ca2+ 9.7; AST 48, ALT, 102, AlkP  55,  T Bili 0.9, TP 5.7,Alb 4.4  CBC: W 8.8, H/H 15.8/47.7, Plt 332; TSH 0.012, T3 uptake 40. Free T4 15.3, Free Thyroxine Index 6.1  TC 138, TG 156, HDL 31, LDL 76  ASSESSMENT / PLAN: Problem List Items Addressed This Visit    Bilateral edema of lower extremity   Essential hypertension (Chronic)   Relevant Medications   metoprolol tartrate (LOPRESSOR) 50 MG tablet   Other Relevant Orders   EKG 12-Lead   Hyperthyroidism   Relevant Medications   methimazole (TAPAZOLE) 5 MG tablet   metoprolol tartrate (LOPRESSOR) 50 MG tablet   Other Relevant Orders   EKG 12-Lead   Palpitations - Primary    Most likely related to Hyperthyroidism -- better with treatment. Will increase metoprolol to 50 mg BID         Current medicines are reviewed at length with the patient today. (+/- concerns) concerned about BP levels not as well controlled. The following changes have been made: increase Metoprolol to 50 mg tid  Patient Instructions  MEDICATION CHANGE   INCREASE METOPROLOL TARTRATE 50 MG ONE TABLET THREE TIMES A DAY.    Your physician wants  you to follow-up in 3-4 [place].    If you need a refill on your cardiac medications before your next appointment, please call your pharmacy.    Studies Ordered:   Orders Placed This Encounter  Procedures  . EKG 12-Lead      Glenetta Hew, M.D., M.S. Interventional Cardiologist   Pager # [phone] Phone # [phone] [address] [place], [place] [address]

## 2017-08-30 NOTE — Patient Instructions (Signed)
MEDICATION CHANGE   INCREASE METOPROLOL TARTRATE 50 MG ONE TABLET THREE TIMES A DAY.    Your physician wants you to follow-up in 3-4 [place].    If you need a refill on your cardiac medications before your next appointment, please call your pharmacy.

## 2017-09-01 ENCOUNTER — Encounter: Payer: Self-pay | Admitting: Cardiology

## 2017-09-01 DIAGNOSIS — R002 Palpitations: Secondary | ICD-10-CM | POA: Insufficient documentation

## 2017-09-01 NOTE — Assessment & Plan Note (Signed)
Most likely related to Hyperthyroidism -- better with treatment. Will increase metoprolol to 50 mg BID

## 2017-09-05 ENCOUNTER — Other Ambulatory Visit: Payer: Self-pay | Admitting: Cardiology

## 2017-10-07 ENCOUNTER — Ambulatory Visit (HOSPITAL_COMMUNITY)
Admission: EM | Admit: 2017-10-07 | Discharge: 2017-10-07 | Disposition: A | Discharge status: Home / Self Care | Payer: BLUE CROSS/BLUE SHIELD

## 2017-10-07 ENCOUNTER — Emergency Department (HOSPITAL_COMMUNITY)
Admission: EM | Admit: 2017-10-07 | Discharge: 2017-10-07 | Disposition: A | Discharge status: Home / Self Care | Payer: BLUE CROSS/BLUE SHIELD | Attending: Emergency Medicine | Admitting: Emergency Medicine

## 2017-10-07 ENCOUNTER — Emergency Department (HOSPITAL_BASED_OUTPATIENT_CLINIC_OR_DEPARTMENT_OTHER)
Admit: 2017-10-07 | Discharge: 2017-10-07 | Disposition: A | Discharge status: Home / Self Care | Payer: BLUE CROSS/BLUE SHIELD | Attending: Emergency Medicine | Admitting: Emergency Medicine

## 2017-10-07 ENCOUNTER — Encounter (HOSPITAL_COMMUNITY): Payer: Self-pay | Admitting: Emergency Medicine

## 2017-10-07 DIAGNOSIS — Z79899 Other long term (current) drug therapy: Secondary | ICD-10-CM | POA: Diagnosis not present

## 2017-10-07 DIAGNOSIS — D6851 Activated protein C resistance: Secondary | ICD-10-CM

## 2017-10-07 DIAGNOSIS — J45909 Unspecified asthma, uncomplicated: Secondary | ICD-10-CM | POA: Diagnosis not present

## 2017-10-07 DIAGNOSIS — I10 Essential (primary) hypertension: Secondary | ICD-10-CM | POA: Diagnosis not present

## 2017-10-07 DIAGNOSIS — M7989 Other specified soft tissue disorders: Secondary | ICD-10-CM | POA: Diagnosis not present

## 2017-10-07 DIAGNOSIS — I82402 Acute embolism and thrombosis of unspecified deep veins of left lower extremity: Secondary | ICD-10-CM | POA: Diagnosis not present

## 2017-10-07 DIAGNOSIS — M79605 Pain in left leg: Secondary | ICD-10-CM | POA: Diagnosis present

## 2017-10-07 DIAGNOSIS — M79609 Pain in unspecified limb: Secondary | ICD-10-CM | POA: Diagnosis not present

## 2017-10-07 DIAGNOSIS — I82491 Acute embolism and thrombosis of other specified deep vein of right lower extremity: Secondary | ICD-10-CM | POA: Diagnosis not present

## 2017-10-07 LAB — BASIC METABOLIC PANEL
Anion gap: 10 (ref 5–15)
BUN: 21 mg/dL — AB (ref 6–20)
CHLORIDE: 100 mmol/L — AB (ref 101–111)
CO2: 28 mmol/L (ref 22–32)
Calcium: 9.8 mg/dL (ref 8.9–10.3)
Creatinine, Ser: 0.97 mg/dL (ref 0.61–1.24)
GFR calc Af Amer: >60 mL/min (ref >60)
GFR calc non Af Amer: >60 mL/min (ref >60)
GLUCOSE: 136 mg/dL — AB (ref 65–99)
POTASSIUM: 3.8 mmol/L (ref 3.5–5.1)
Sodium: 138 mmol/L (ref 135–145)

## 2017-10-07 LAB — CBC
HEMATOCRIT: 42.9 % (ref 39.0–52.0)
HEMOGLOBIN: 14.6 g/dL (ref 13.0–17.0)
MCH: 29.3 pg (ref 26.0–34.0)
MCHC: 34 g/dL (ref 30.0–36.0)
MCV: 86 fL (ref 78.0–100.0)
Platelets: 280 10*3/uL (ref 150–400)
RBC: 4.99 MIL/uL (ref 4.22–5.81)
RDW: 14.4 % (ref 11.5–15.5)
WBC: 15.1 10*3/uL — ABNORMAL HIGH (ref 4.0–10.5)

## 2017-10-07 MED ORDER — ELIQUIS 5 MG VTE STARTER PACK: ORAL_TABLET | ORAL | 0 refills | Status: DC

## 2017-10-07 MED ORDER — APIXABAN 5 MG PO TABS
10.0000 mg | ORAL_TABLET | Freq: Once | ORAL | Status: AC
  Administered 2017-10-07: 10 mg via ORAL
  Filled 2017-10-07: qty 2

## 2017-10-07 NOTE — Consult Note (Signed)
Hospital Consult    Reason for Consult:  Extensive RLE dvt Requesting Physician:  Alvino Chapel MRN #:  [id number]  History of Present Illness: This is a 66 y.o. 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 Last dvt of left popliteal per patient. Now has a few day history of left leg swelling and discomfort. He has not been anticoagulated. Denies leg feeling heavy or tight. No shortness of breath.  Past Medical History:  Diagnosis Date  . ALLERGIC RHINITIS 05/21/2010  . ASTHMA 05/21/2010  . Asthma   . Factor V Leiden ([place])   . HYPERTENSION 05/21/2010   Diffiult to control  . 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 ACHILLES TENDON REPAIR    . DOPPLER ECHOCARDIOGRAPHY  12/30/2011   EF >55%; MILD LVH,MILD LAE,NORMAL DIASTOLIC FUNCTION  . RENAL DOPPLER  12/30/2011   NORMAL RENAL    No Known Allergies  Prior to Admission medications   Medication Sig Start Date End Date Taking? Authorizing Provider  albuterol (VENTOLIN HFA) 108 (90 BASE) MCG/ACT inhaler Inhale 2 puffs into the lungs every 6 (six) hours as needed. 05/31/11   Marletta Lor, MD  amLODipine (NORVASC) 10 MG tablet TAKE 1 TABLET BY MOUTH EVERY DAY 09/05/17   Leonie Man, MD  Calcium Carbonate-Vitamin D (CALTRATE 600+D) 600-400 MG-UNIT per tablet Take 1 tablet by mouth daily.      [provider]  chlorthalidone (HYGROTON) 50 MG tablet Take 1 tablet (50 mg total) by mouth daily. 10/17/15   Leonie Man, MD  chlorthalidone (HYGROTON) 50 MG tablet TAKE 1 TABLET BY MOUTH EVERY DAY 09/05/17   Leonie Man, MD  methimazole (TAPAZOLE) 5 MG tablet Take 5 mg by mouth 3 (three) times daily. 08/22/17 08/22/18  [provider]  metoprolol tartrate (LOPRESSOR) 50 MG tablet Take 1 tablet (50 mg total) by mouth 3 (three) times daily. 08/30/17   Leonie Man, MD  montelukast (SINGULAIR) 10 MG  tablet Take 10 mg by mouth daily as needed.     [provider]  Multiple Vitamin (MULTIVITAMIN) tablet Take 1 tablet by mouth daily.      [provider]  Omega-3 Fatty Acids (FISH OIL) 1000 MG CAPS Take by mouth 2 (two) times daily.     [provider]  ramipril (ALTACE) 10 MG capsule Take 10 mg by mouth daily.      [provider]    Social History   Social History  . Marital status: Married    Spouse name: N/A  . Number of children: N/A  . Years of education: N/A   Occupational History  . Not on file.   Social History Main Topics  . Smoking status: Never Smoker  . Smokeless tobacco: Never Used  . Alcohol use Yes     Comment: occasional   . Drug use: No  . Sexual activity: Not on file   Other Topics Concern  . Not on file   Social History Narrative   He is a married father of 2. He does exercise about 3-4 days a week with no true symptoms. Does not smoke and occasional social alcohol.      Family History  Problem Relation Age of Onset  . Cancer Father   . Cancer Brother   . Hypertension Mother   . Heart disease Neg Hx  REVIEW OF SYSTEMS (negative unless checked):   Cardiac:  []  Chest pain or chest pressure? []  Shortness of breath upon activity? []  Shortness of breath when lying flat? []  Irregular heart rhythm?  Vascular:  []  Pain in calf, thigh, or hip brought on by walking? []  Pain in feet at night that wakes you up from your sleep? [x]  Blood clot in your veins? [x]  Leg swelling?  Pulmonary:  []  Oxygen at home? []  Productive cough? []  Wheezing?  Neurologic:  []  Sudden weakness in arms or legs? []  Sudden numbness in arms or legs? []  Sudden onset of difficult speaking or slurred speech? []  Temporary loss of vision in one eye? []  Problems with dizziness?  Gastrointestinal:  []  Blood in stool? []  Vomited blood?  Genitourinary:  []  Burning when urinating? []  Blood in urine?  Psychiatric:  []  Major  depression  Hematologic:  []  Bleeding problems? []  Problems with blood clotting?  Dermatologic:  []  Rashes or ulcers?  Constitutional:  []  Fever or chills?  Ear/Nose/Throat:  []  Change in hearing? []  Nose bleeds? []  Sore throat?  Musculoskeletal:  []  Back pain? []  Joint pain? []  Muscle pain?   Physical Examination  Vitals:   10/07/17 1845 10/07/17 1900  BP: 124/79 (!) 149/71  Pulse: 73 72  Resp:    Temp:    SpO2: 100% 100%   Body mass index is 28.37 kg/m.  General:  WDWN in NAD Gait: Not observed HENT: WNL, normocephalic Pulmonary: normal non-labored breathing, without Rales, rhonchi,  wheezing Cardiac: rrr Abdomen: soft, NT/ND, no masses Extremities: minimal edema lle Musculoskeletal: no muscle wasting or atrophy  Neurologic: A&O X 3  SENSATION: normal; MOTOR FUNCTION:  moving all extremities equally. Speech is fluent/normal Psychiatric: appropriate mood and affect  CBC    Component Value Date/Time   WBC 15.1 (H) 10/07/2017 1500   RBC 4.99 10/07/2017 1500   HGB 14.6 10/07/2017 1500   HCT 42.9 10/07/2017 1500   PLT 280 10/07/2017 1500   MCV 86.0 10/07/2017 1500   MCH 29.3 10/07/2017 1500   MCHC 34.0 10/07/2017 1500   RDW 14.4 10/07/2017 1500    BMET    Component Value Date/Time   NA 138 10/07/2017 1500   K 3.8 10/07/2017 1500   CL 100 (L) 10/07/2017 1500   CO2 28 10/07/2017 1500   GLUCOSE 136 (H) 10/07/2017 1500   BUN 21 (H) 10/07/2017 1500   CREATININE 0.97 10/07/2017 1500   CALCIUM 9.8 10/07/2017 1500   GFRNONAA >60 10/07/2017 1500   GFRAA >60 10/07/2017 1500    COAGS: Lab Results  Component Value Date   INR 2.1 11/17/2010   INR 1.9 10/23/2010   INR 2.6 09/23/2010     Non-Invasive Vascular Imaging:   Left - Positive for and acute occlusive DVT coursing from the posterior tibial vein through the popliteal, proximal profunda, and distal common femoral veins. The proximal common femoral and iliac veins appear patent. There is no  evidence of a superficial thrombosis involving the left lower extremity or a Baker's cyst.    ASSESSMENT/PLAN: This is a 66 y.o. male with factor V leiden and extensive dvt of left leg. No extension into iliofemoral system and symptoms are surprisingly minimal. Will need to be anticoagulated lifelong I would think at this point. Given large thrombus burden will have high risk of post thrombotic syndrome. Given this I will see him in 2-3 weeks to make sure his symptoms are at least not worsening. Ok for ambulation and compression once anticoagulated.  Kenna Seward C. Donzetta Matters, MD [facility] Office: [phone] Pager: [phone]

## 2017-10-07 NOTE — ED Provider Notes (Signed)
[facility] DEPT Provider Note   CSN: [id number] Arrival date & time: 10/07/17  1434     History   Chief Complaint Chief Complaint  Patient presents with  . Leg Pain    HPI Matthew Trujillo is a 66 y.o. male.  HPI  patient presents With pain and swelling of his left lower extremity. Has had it for around the last 4 days. He initially thought it was from a muscle cramp from sleeping in a recliner. Previous history of pulmonary embolism and DVT around 7 years ago. Also has a history of factor V Leiden. He was on Coumadin for 6 months and then stopped. No other issues until today. No chest pain or trouble breathing. No fevers or chills. States he did not sleep well due to the pain. Past Medical History:  Diagnosis Date  . ALLERGIC RHINITIS 05/21/2010  . ASTHMA 05/21/2010  . Asthma   . Factor V Leiden ([place])   . HYPERTENSION 05/21/2010   Diffiult to control  . PULMONARY EMBOLISM 05/21/2010    Patient Active Problem List   Diagnosis Date Noted  . Palpitations 09/01/2017  . Hyperthyroidism 08/30/2017  . Bilateral edema of lower extremity 08/30/2017  . Obesity (BMI 30.0-34.9) 01/13/2014  . Essential hypertension 05/21/2010  . History of pulmonary embolism 05/21/2010  . ALLERGIC RHINITIS 05/21/2010  . ASTHMA 05/21/2010    Past Surgical History:  Procedure Laterality Date  . ACHILLES TENDON REPAIR    . DOPPLER ECHOCARDIOGRAPHY  12/30/2011   EF >55%; MILD LVH,MILD LAE,NORMAL DIASTOLIC FUNCTION  . RENAL DOPPLER  12/30/2011   NORMAL RENAL       Home Medications    Prior to Admission medications   Medication Sig Start Date End Date Taking? Authorizing Provider  albuterol (VENTOLIN HFA) 108 (90 BASE) MCG/ACT inhaler Inhale 2 puffs into the lungs every 6 (six) hours as needed. 05/31/11  Yes Marletta Lor, MD  amLODipine (NORVASC) 10 MG tablet TAKE 1 TABLET BY MOUTH EVERY DAY Patient taking differently: TAKE 10 mg TABLET BY MOUTH EVERY DAY 09/05/17  Yes Leonie Man, MD  aspirin EC 81 MG tablet Take 81 mg by mouth daily.   Yes [provider]  Calcium Carbonate-Vitamin D (CALTRATE 600+D) 600-400 MG-UNIT per tablet Take 1 tablet by mouth daily.     Yes [provider]  chlorthalidone (HYGROTON) 50 MG tablet Take 1 tablet (50 mg total) by mouth daily. 10/17/15  Yes Leonie Man, MD  Cholecalciferol (VITAMIN D-3) 5000 units TABS Take 5,000 Units by mouth daily.   Yes [provider]  methimazole (TAPAZOLE) 5 MG tablet Take 5 mg by mouth 3 (three) times daily. 08/22/17 08/22/18 Yes [provider]  metoprolol tartrate (LOPRESSOR) 50 MG tablet Take 1 tablet (50 mg total) by mouth 3 (three) times daily. 08/30/17  Yes Leonie Man, MD  montelukast (SINGULAIR) 10 MG tablet Take 10 mg by mouth daily as needed.    Yes [provider]  Multiple Vitamin (MULTIVITAMIN) tablet Take 1 tablet by mouth daily.     Yes [provider]  Omega-3 Fatty Acids (FISH OIL) 1000 MG CAPS Take by mouth 2 (two) times daily.    Yes [provider]  ramipril (ALTACE) 10 MG capsule Take 10 mg by mouth daily.     Yes [provider]  chlorthalidone (HYGROTON) 50 MG tablet TAKE 1 TABLET BY MOUTH EVERY DAY Patient not taking: Reported on 10/07/2017 09/05/17   Leonie Man, MD  ELIQUIS STARTER PACK (ELIQUIS STARTER PACK) 5 MG TABS Take as directed on package: start with two-5mg  tablets twice daily for 7 days. On day 8, switch to one-5mg  tablet twice daily. 10/07/17   Davonna Belling, MD    Family History Family History  Problem Relation Age of Onset  . Cancer Father   . Cancer Brother   . Hypertension Mother   . Heart disease Neg Hx     Social History Social History  Substance Use Topics  . Smoking status: Never Smoker  . Smokeless tobacco: Never Used  . Alcohol use Yes     Comment: occasional      Allergies   Patient has no known allergies.   Review of Systems Review of Systems    Constitutional: Negative for appetite change, fatigue and fever.  HENT: Negative for congestion.   Respiratory: Negative for shortness of breath.   Cardiovascular: Positive for leg swelling. Negative for chest pain.  Gastrointestinal: Negative for abdominal pain.  Genitourinary: Negative for flank pain.  Musculoskeletal: Negative for back pain.  Skin: Negative for rash.  Neurological: Negative for syncope.  Hematological: Negative for adenopathy.  Psychiatric/Behavioral: Negative for confusion.     Physical Exam Updated Vital Signs BP (!) 156/80   Pulse 64   Temp 98.8 F (37.1 C) (Oral)   Resp 18   Ht 6\' 2"  (1.88 m)   Wt 100.2 kg (221 lb)   SpO2 98%   BMI 28.37 kg/m   Physical Exam  Constitutional: He appears well-developed.  HENT:  Head: Normocephalic.  Eyes: Pupils are equal, round, and reactive to light.  Neck: Neck supple.  Cardiovascular: Normal rate.   Pulmonary/Chest: Effort normal. He exhibits no tenderness.  Abdominal: There is no tenderness.  Musculoskeletal: He exhibits edema.  Patient with fullness behind the left knee and on the calf. Also just proximal to the left knee posteriorly. Some edema of the lower leg. No real skin changes. Mild pain with palpation. Leg is warm and pulses are intact.  Neurological: He is alert.  Skin: Skin is warm. Capillary refill takes less than 2 seconds.     ED Treatments / Results  Labs (all labs ordered are listed, but only abnormal results are displayed) Labs Reviewed  BASIC METABOLIC PANEL - Abnormal; Notable for the following:       Result Value   Chloride 100 (*)    Glucose, Bld 136 (*)    BUN 21 (*)    All other components within normal limits  CBC - Abnormal; Notable for the following:    WBC 15.1 (*)    All other components within normal limits    EKG  EKG Interpretation None       Radiology No results found.  Procedures Procedures (including critical care time)  Medications Ordered in  ED Medications  apixaban (ELIQUIS) tablet 10 mg (not administered)     Initial Impression / Assessment and Plan / ED Course  I have reviewed the triage vital signs and the nursing notes.  Pertinent labs & imaging results that were available during my care of the patient were reviewed by me and considered in my medical decision making (see chart for details).     Patient with leg pain and rather extensive left leg DVT. Had been on Coumadin previously but had been stopped after 6 months. However with his factor V Leiden he will likely not need lifelong and a quite ablation. Seen in the ER by Dr. Donzetta Matters, for massive surgery.  Will see in follow-up in 2 weeks. Started on Eliquis. No chest pain tachycardia or shortness of breath to signify a pulmonary embolism. If there is an occult PE is likely small.  Final Clinical Impressions(s) / ED Diagnoses   Final diagnoses:  Acute deep vein thrombosis (DVT) of left lower extremity, unspecified vein (HCC)  Factor V Leiden (HCC)    New Prescriptions New Prescriptions   ELIQUIS STARTER PACK (ELIQUIS STARTER PACK) 5 MG TABS    Take as directed on package: start with two-5mg  tablets twice daily for 7 days. On day 8, switch to one-5mg  tablet twice daily.     Davonna Belling, MD 10/07/17 2032

## 2017-10-07 NOTE — Progress Notes (Signed)
VASCULAR LAB PRELIMINARY  PRELIMINARY  PRELIMINARY  PRELIMINARY  Left lower extremity venous duplex completed.    Preliminary report:  Left - Positive for and acute occlusive DVT coursing from the posterior tibial vein through the popliteal, proximal profunda, and distal common femoral veins. The proximal common femoral and iliac veins appear patent. There is no evidence of a superficial thrombosis involving the left lower extremity or a Baker's cyst.  Emilian Stawicki, RVS 10/07/2017, 6:26 PM

## 2017-10-07 NOTE — ED Notes (Signed)
The pt is waiting on medicine from pharmacy

## 2017-10-07 NOTE — ED Notes (Signed)
Pt given ice water, per Dr. Alvino Chapel.

## 2017-10-07 NOTE — Care Management (Signed)
CM met with patient at bedside to discuss medication assistance with apixaban or Xarelto co-pays. CM is unable to perform prior approval with [employer] at this time.  CM discussed the 30 trail free voucher for both medications, and provided patient voucher with instructions on how to redeem. Patient can check with insurance company on co-pay. Patient verbalized understanding and teach back done, no further questions or concerns verbalized.

## 2017-10-07 NOTE — ED Triage Notes (Signed)
Pt reports left thigh and knee pain since Tues. Pt states pain has gotten progressively worse. Has hx of PE. Reports frequent long car rides and airplane rides for work. Pt ambulatory, VSS.

## 2017-10-07 NOTE — ED Notes (Signed)
The pt has had lt leg pain for 4 days   He has a history of the same  His lt leg is swollen and tender.  He came back from [facility] just now

## 2017-10-07 NOTE — ED Notes (Signed)
casr management at the bedside

## 2017-10-08 ENCOUNTER — Telehealth: Payer: Self-pay | Admitting: *Deleted

## 2017-10-08 NOTE — Telephone Encounter (Signed)
After much ado explained to Pharmacist that starter pack is available in hospital and should have been issued prior to discharge. Pharmacy had Rx for "Starter pack" CM called in Rx for Eliquis 5mg  tabs;  2 tabs po Bid x 7days then 1 tab bid . #74 Tabs. ( Pharmacist also had 30 day free card for Xarelto which was not mentioned in MD note). No further CM needs at this time.

## 2017-10-10 ENCOUNTER — Telehealth: Payer: Self-pay | Admitting: Vascular Surgery

## 2017-10-10 NOTE — Telephone Encounter (Signed)
-----   Message from Mena Goes, RN sent at 10/10/2017  9:46 AM EDT ----- Regarding: appt in 2-3 weeks   ----- Message ----- From: Waynetta Sandy, MD Sent: 10/07/2017   9:12 PM To: [address]  Matthew Trujillo [id number] May 29, 1951  ED level 3 consult F/u in 2-3 weeks with me, no studies

## 2017-10-10 NOTE — Telephone Encounter (Signed)
Sched appt 10/28/17 at 10:00. Lm on cell#.

## 2017-10-20 NOTE — Addendum Note (Signed)
Addended by: Therisa Doyne on: 10/20/2017 11:58 AM   Modules accepted: Orders

## 2017-10-26 ENCOUNTER — Ambulatory Visit (INDEPENDENT_AMBULATORY_CARE_PROVIDER_SITE_OTHER): Payer: BLUE CROSS/BLUE SHIELD | Admitting: Internal Medicine

## 2017-10-26 ENCOUNTER — Encounter: Payer: Self-pay | Admitting: Internal Medicine

## 2017-10-26 DIAGNOSIS — E0500 Thyrotoxicosis with diffuse goiter without thyrotoxic crisis or storm: Secondary | ICD-10-CM

## 2017-10-26 DIAGNOSIS — I82402 Acute embolism and thrombosis of unspecified deep veins of left lower extremity: Secondary | ICD-10-CM

## 2017-10-26 MED ORDER — APIXABAN 5 MG PO TABS: 5.0000 mg | ORAL_TABLET | Freq: Two times a day (BID) | ORAL | 3 refills | Status: DC

## 2017-10-26 NOTE — Progress Notes (Signed)
Subjective:    Patient ID: Matthew Trujillo, male    DOB: [birth year], 66 y.o.   MRN: [id number]  HPI  66 year old patient, who has not been seen here in a number of years.  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  He has a prior history of left leg DVT, gated by pulmonary embolism and now will be on chronic anticoagulation. He has completed his starter pack and now is on Eliquis 5 mg twice daily. He has had resolution of the left leg discomfort.  He is scheduled to see vascular surgery in the near future  He has essential hypertension and history of asthma.  Past Medical History:  Diagnosis Date  . ALLERGIC RHINITIS 05/21/2010  . ASTHMA 05/21/2010  . Asthma   . Factor V Leiden ([place])   . HYPERTENSION 05/21/2010   Diffiult to control  . PULMONARY EMBOLISM 05/21/2010     Social History   Social History  . Marital status: Married    Spouse name: N/A  . Number of children: N/A  . Years of education: N/A   Occupational History  . Not on file.   Social History Main Topics  . Smoking status: Never Smoker  . Smokeless tobacco: Never Used  . Alcohol use Yes     Comment: occasional   . Drug use: No  . Sexual activity: Not on file   Other Topics Concern  . Not on file   Social History Narrative   He is a married father of 2. He does exercise about 3-4 days a week with no true symptoms. Does not smoke and occasional social alcohol.     Past Surgical History:  Procedure Laterality Date  . ACHILLES TENDON REPAIR    . DOPPLER ECHOCARDIOGRAPHY  12/30/2011   EF >55%; MILD LVH,MILD LAE,NORMAL DIASTOLIC FUNCTION  . RENAL DOPPLER  12/30/2011   NORMAL RENAL    Family History  Problem Relation Age of Onset  . Cancer Father   . Cancer Brother   . Hypertension Mother   . Heart  disease Neg Hx     No Known Allergies  Current Outpatient Prescriptions on File Prior to Visit  Medication Sig Dispense Refill  . albuterol (VENTOLIN HFA) 108 (90 BASE) MCG/ACT inhaler Inhale 2 puffs into the lungs every 6 (six) hours as needed. 1 Inhaler 6  . amLODipine (NORVASC) 10 MG tablet TAKE 1 TABLET BY MOUTH EVERY DAY (Patient taking differently: TAKE 10 mg TABLET BY MOUTH EVERY DAY) 90 tablet 3  . Calcium Carbonate-Vitamin D (CALTRATE 600+D) 600-400 MG-UNIT per tablet Take 1 tablet by mouth daily.      . chlorthalidone (HYGROTON) 50 MG tablet TAKE 1 TABLET BY MOUTH EVERY DAY 90 tablet 3  . Cholecalciferol (VITAMIN D-3) 5000 units TABS Take 5,000 Units by mouth daily.    . methimazole (TAPAZOLE) 5 MG tablet Take 5 mg by mouth 3 (three) times daily.    . metoprolol tartrate (LOPRESSOR) 50 MG tablet Take 1 tablet (50 mg total) by mouth 3 (three) times daily. 90 tablet 11  . montelukast (SINGULAIR) 10 MG tablet Take 10 mg by mouth daily as needed.     . Multiple Vitamin (MULTIVITAMIN) tablet Take 1 tablet by mouth daily.      . Omega-3  Fatty Acids (FISH OIL) 1000 MG CAPS Take by mouth 2 (two) times daily.     . ramipril (ALTACE) 10 MG capsule Take 10 mg by mouth daily.       Current Facility-Administered Medications on File Prior to Visit  Medication Dose Route Frequency Provider Last Rate Last Dose  . albuterol (PROVENTIL) (2.5 MG/3ML) 0.083% nebulizer solution 5 mg  5 mg Nebulization Once Roselee Culver, MD      . ipratropium (ATROVENT) nebulizer solution 0.5 mg  0.5 mg Nebulization Once Roselee Culver, MD        BP 130/72 (BP Location: Left Arm, Patient Position: Sitting, Cuff Size: Normal)   Pulse (!) 53   Temp (!) 97.5 F (36.4 C) (Oral)   Ht 6\' 2"  (1.88 m)   Wt 228 lb 9.6 oz (103.7 kg)   SpO2 98%   BMI 29.35 kg/m     Review of Systems  Constitutional: Negative for appetite change, chills, fatigue and fever.  HENT: Negative for congestion, dental problem,  ear pain, hearing loss, sore throat, tinnitus, trouble swallowing and voice change.   Eyes: Negative for pain, discharge and visual disturbance.  Respiratory: Negative for cough, chest tightness, wheezing and stridor.   Cardiovascular: Positive for leg swelling. Negative for chest pain and palpitations.  Gastrointestinal: Negative for abdominal distention, abdominal pain, blood in stool, constipation, diarrhea, nausea and vomiting.  Genitourinary: Negative for difficulty urinating, discharge, flank pain, genital sores, hematuria and urgency.  Musculoskeletal: Negative for arthralgias, back pain, gait problem, joint swelling, myalgias and neck stiffness.  Skin: Negative for rash.  Neurological: Negative for dizziness, syncope, speech difficulty, weakness, numbness and headaches.  Hematological: Negative for adenopathy. Does not bruise/bleed easily.  Psychiatric/Behavioral: Negative for behavioral problems and dysphoric mood. The patient is not nervous/anxious.        Objective:   Physical Exam  Constitutional: He appears well-developed and well-nourished.  Blood pressure 130/70  HENT:  Head: Normocephalic and atraumatic.  Right Ear: External ear normal.  Left Ear: External ear normal.  Nose: Nose normal.  Mouth/Throat: Oropharynx is clear and moist.  Eyes: Pupils are equal, round, and reactive to light. Conjunctivae and EOM are normal. No scleral icterus.  Neck: Normal range of motion. Neck supple. No JVD present. No thyromegaly present.  Cardiovascular: Regular rhythm, normal heart sounds and intact distal pulses.  Exam reveals no gallop and no friction rub.   No murmur heard. Pulmonary/Chest: Effort normal and breath sounds normal. He exhibits no tenderness.  Abdominal: Soft. Bowel sounds are normal. He exhibits no distension and no mass. There is no tenderness.  Musculoskeletal: Normal range of motion. He exhibits edema. He exhibits no tenderness.  Swelling involving the left distal  leg.  No tenderness to palpation.  Negative Homans  Lymphadenopathy:    He has no cervical adenopathy.  Neurological: He is alert. He has normal reflexes. No cranial nerve deficit. Coordination normal.  Skin: Skin is warm and dry. No rash noted.  Psychiatric: He has a normal mood and affect. His behavior is normal.          Assessment & Plan:   Recurrent left leg DVT History of pulmonary embolism.  Continue chronic anticoagulation Essential hypertension History of Graves' disease.  Follow-up endocrinology.  Remains on antithyroid drug therapy History of asthma, stable  CPX 6 months  Sharie Amorin Pilar Plate

## 2017-10-26 NOTE — Patient Instructions (Addendum)
Deep Vein Thrombosis A deep vein thrombosis (DVT) is a blood clot (thrombus) that usually occurs in a deep, larger vein of the lower leg or the pelvis, or in an upper extremity such as the arm. These are dangerous and can lead to serious and even life-threatening complications if the clot travels to the lungs. A DVT can damage the valves in your leg veins so that instead of flowing upward, the blood pools in the lower leg. This is called post-thrombotic syndrome, and it can result in pain, swelling, discoloration, and sores on the leg. What are the causes? A DVT is caused by the formation of a blood clot in your leg, pelvis, or arm. Usually, several things contribute to the formation of blood clots. A clot may develop when:  Your blood flow slows down.  Your vein becomes damaged in some way.  You have a condition that makes your blood clot more easily.  What increases the risk? A DVT is more likely to develop in:  People who are older, especially over 60 years of age.  People who are overweight (obese).  People who sit or lie still for a long time, such as during long-distance travel (over 4 hours), bed rest, hospitalization, or during recovery from certain medical conditions like a stroke.  People who do not engage in much physical activity (sedentary lifestyle).  People who have chronic breathing disorders.  People who have a personal or family history of blood clots or blood clotting disease.  People who have peripheral vascular disease (PVD), diabetes, or some types of cancer.  People who have heart disease, especially if the person had a recent heart attack or has congestive heart failure.  People who have neurological diseases that affect the legs (leg paresis).  People who have had a traumatic injury, such as breaking a hip or leg.  People who have recently had major or lengthy surgery, especially on the hip, knee, or abdomen.  People who have had a central line placed  inside a large vein.  People who take medicines that contain the hormone estrogen. These include birth control pills and hormone replacement therapy.  Pregnancy or during childbirth or the postpartum period.  Long plane flights (over 8 hours).  What are the signs or symptoms?  Symptoms of a DVT can include:  Swelling of your leg or arm, especially if one side is much worse.  Warmth and redness of your leg or arm, especially if one side is much worse.  Pain in your arm or leg. If the clot is in your leg, symptoms may be more noticeable or worse when you stand or walk.  A feeling of pins and needles, if the clot is in the arm.  The symptoms of a DVT that has traveled to the lungs (pulmonary embolism, PE) usually start suddenly and include:  Shortness of breath while active or at rest.  Coughing or coughing up blood or blood-tinged mucus.  Chest pain that is often worse with deep breaths.  Rapid or irregular heartbeat.  Feeling light-headed or dizzy.  Fainting.  Feeling anxious.  Sweating.  There may also be pain and swelling in a leg if that is where the blood clot started. These symptoms may represent a serious problem that is an emergency. Do not wait to see if the symptoms will go away. Get medical help right away. Call your local emergency services (911 in the [place] Do not drive yourself to the hospital. How is this diagnosed? Your health   care provider will take a medical history and perform a physical exam. You may also have other tests, including:  Blood tests to assess the clotting properties of your blood.  Imaging tests, such as CT, ultrasound, MRI, X-ray, and other tests to see if you have clots anywhere in your body.  How is this treated? After a DVT is identified, it can be treated. The type of treatment that you receive depends on many factors, such as the cause of your DVT, your risk for bleeding or developing more clots, and other medical conditions that  you have. Sometimes, a combination of treatments is necessary. Treatment options may be combined and include:  Monitoring the blood clot with ultrasound.  Taking medicines by mouth, such as newer blood thinners (anticoagulants), thrombolytics, or warfarin.  Taking anticoagulant medicine by injection or through an IV tube.  Wearing compression stockings or using different types ofdevices.  Surgery (rare) to remove the blood clot or to place a filter in your abdomen to stop the blood clot from traveling to your lungs.  Treatments for a DVT are often divided into immediate treatment and long-term treatment (up to 3 months after DVT). You can work with your health care provider to choose the treatment program that is best for you. Follow these instructions at home: If you are taking a newer oral anticoagulant:  Take the medicine every single day at the same time each day.  Understand what foods and drugs interact with this medicine.  Understand that there are no regular blood tests required when using this medicine.  Understand the side effects of this medicine, including excessive bruising or bleeding. Ask your health care provider or pharmacist about other possible side effects. If you are taking warfarin:  Understand how to take warfarin and know which foods can affect how warfarin works in your body.  Understand that it is dangerous to take too much or too little warfarin. Too much warfarin increases the risk of bleeding. Too little warfarin continues to allow the risk for blood clots.  Follow your PT and INR blood testing schedule. The PT and INR results allow your health care provider to adjust your dose of warfarin. It is very important that you have your PT and INR tested as often as told by your health care provider.  Avoid major changes in your diet, or tell your health care provider before you change your diet. Arrange a visit with a registered dietitian to answer your  questions. Many foods, especially foods that are high in vitamin K, can interfere with warfarin and affect the PT and INR results. Eat a consistent amount of foods that are high in vitamin K, such as: ? Spinach, kale, broccoli, cabbage, collard greens, turnip greens, Brussels sprouts, peas, cauliflower, seaweed, and parsley. ? Beef liver and pork liver. ? Green tea. ? Soybean oil.  Tell your health care provider about any and all medicines, vitamins, and supplements that you take, including aspirin and other over-the-counter anti-inflammatory medicines. Be especially cautious with aspirin and anti-inflammatory medicines. Do not take those before you ask your health care provider if it is safe to do so. This is important because many medicines can interfere with warfarin and affect the PT and INR results.  Do not start or stop taking any over-the-counter or prescription medicine unless your health care provider or pharmacist tells you to do so. If you take warfarin, you will also need to do these things:  Hold pressure over cuts for longer than   usual.  Tell your dentist and other health care providers that you are taking warfarin before you have any procedures in which bleeding may occur.  Avoid alcohol or drink very small amounts. Tell your health care provider if you change your alcohol intake.  Do not use tobacco products, including cigarettes, chewing tobacco, and e-cigarettes. If you need help quitting, ask your health care provider.  Avoid contact sports.  General instructions  Take over-the-counter and prescription medicines only as told by your health care provider. Anticoagulant medicines can have side effects, including easy bruising and difficulty stopping bleeding. If you are prescribed an anticoagulant, you will also need to do these things: ? Hold pressure over cuts for longer than usual. ? Tell your dentist and other health care providers that you are taking anticoagulants  before you have any procedures in which bleeding may occur. ? Avoid contact sports.  Wear a medical alert bracelet or carry a medical alert card that says you have had a PE.  Ask your health care provider how soon you can go back to your normal activities. Stay active to prevent new blood clots from forming.  Make sure to exercise while traveling or when you have been sitting or standing for a long period of time. It is very important to exercise. Exercise your legs by walking or by tightening and relaxing your leg muscles often. Take frequent walks.  Wear compression stockings as told by your health care provider to help prevent more blood clots from forming.  Do not use tobacco products, including cigarettes, chewing tobacco, and e-cigarettes. If you need help quitting, ask your health care provider.  Keep all follow-up appointments with your health care provider. This is important. How is this prevented? Take these actions to decrease your risk of developing another DVT:  Exercise regularly. For at least 30 minutes every day, engage in: ? Activity that involves moving your arms and legs. ? Activity that encourages good blood flow through your body by increasing your heart rate.  Exercise your arms and legs every hour during long-distance travel (over 4 hours). Drink plenty of water and avoid drinking alcohol while traveling.  Avoid sitting or lying in bed for long periods of time without moving your legs.  Maintain a weight that is appropriate for your height. Ask your health care provider what weight is healthy for you.  If you are a woman who is over 35 years of age, avoid unnecessary use of medicines that contain estrogen. These include birth control pills.  Do not smoke, especially if you take estrogen medicines. If you need help quitting, ask your health care provider.  If you are hospitalized, prevention measures may include:  Early walking after surgery, as soon as your  health care provider says that it is safe.  Receiving anticoagulants to prevent blood clots.If you cannot take anticoagulants, other options may be available, such as wearing compression stockings or using different types of devices.  Get help right away if:  You have new or increased pain, swelling, or redness in an arm or leg.  You have numbness or tingling in an arm or leg.  You have shortness of breath while active or at rest.  You have chest pain.  You have a rapid or irregular heartbeat.  You feel light-headed or dizzy.  You cough up blood.  You notice blood in your vomit, bowel movement, or urine. These symptoms may represent a serious problem that is an emergency. Do not wait to see   if the symptoms will go away. Get medical help right away. Call your local emergency services (911 in the [place] Do not drive yourself to the hospital. This information is not intended to replace advice given to you by your health care provider. Make sure you discuss any questions you have with your health care provider. Document Released: 12/13/2005 Document Revised: 05/20/2016 Document Reviewed: 04/09/2015 Elsevier Interactive Patient Education  2017 [employer].  Bleeding Precautions When on Anticoagulant Therapy WHAT IS ANTICOAGULANT THERAPY? Anticoagulant therapy is taking medicine to prevent or reduce blood clots. It is also called blood thinner therapy. Blood clots that form in your blood vessels can be dangerous. They can break loose and travel to your heart, lungs, or brain. This increases your risk of a heart attack or stroke. Anticoagulant therapy causes blood to clot more slowly. You may need anticoagulant therapy if you have:  A medical condition that increases the likelihood that blood clots will form.  A heart defect or a problem with heart rhythm. It is also a common treatment after heart surgery, such as valve replacement. WHAT ARE COMMON TYPES OF ANTICOAGULANT  THERAPY? Anticoagulant medicine can be injected or taken by mouth.If you need anticoagulant therapy quickly at the hospital, the medicine may be injected under your skin or given through an IV tube. Heparin is a common example of an anticoagulant that you may get at the hospital. Most anticoagulant therapy is in the form of pills that you take at home every day. These may include:  Aspirin. This common blood thinner works by preventing blood cells (platelets) from sticking together to form a clot. Aspirin is not as strong as anticoagulants that slow down the time that it takes for your body to form a clot.  Clopidogrel. This is a newer type of drug that affects platelets. It is stronger than aspirin.  Warfarin. This is the most common anticoagulant. It changes the way your body uses vitamin K, a vitamin that helps your blood to clot. The risk of bleeding is higher with warfarin than with aspirin. You will need frequent blood tests to make sure you are taking the safest amount.  New anticoagulants. Several new drugs have been approved. They are all taken by mouth. Studies show that these drugs work as well as warfarin. They do not require blood testing. They may cause less bleeding risk than warfarin. WHAT DO I NEED TO REMEMBER WHEN TAKING ANTICOAGULANT THERAPY? Anticoagulant therapy decreases your risk of forming a blood clot, but it increases your risk of bleeding. Work closely with your health care provider to make sure you are taking your medicine safely. These tips can help:  Learn ways to reduce your risk of bleeding.  If you are taking warfarin: ? Have blood tests as ordered by your health care provider. ? Do not make any sudden changes to your diet. Vitamin K in your diet can make warfarin less effective. ? Do not get pregnant. This medicine may cause birth defects.  Take your medicine at the same time every day. If you forget to take your medicine, take it as soon as you remember. If  you miss a whole day, do not double your dose of medicine. Take your normal dose and call your health care provider to check in.  Do not stop taking your medicine on your own.  Tell your health care provider before you start taking any new medicine, vitamin, or herbal product. Some of these could interfere with your therapy.  Tell  all of your health care providers that you are on anticoagulant therapy.  Do not have surgery, medical procedures, or dental work until you tell your health care provider that you are on anticoagulant therapy. WHAT CAN AFFECT HOW ANTICOAGULANTS WORK? Certain foods, vitamins, medicines, supplements, and herbal medicines change the way that anticoagulant therapy works. They may increase or decrease the effects of your anticoagulant therapy. Either result can be dangerous for you.  Many over-the-counter medicines for pain, colds, or stomach problems interfere with anticoagulant therapy. Take these only as told by your health care provider.  Do not drink alcohol. It can interfere with your medicine and increase your risk of an injury that causes bleeding.  If you are taking warfarin, do not begin eating more foods that contain vitamin K. These include leafy green vegetables. Ask your health care provider if you should avoid any foods. WHAT ARE SOME WAYS TO PREVENT BLEEDING? You can prevent bleeding by taking certain precautions:  Be extra careful when you use knives, scissors, or other sharp objects.  Use an electric razor instead of a blade.  Do not use toothpicks.  Use a soft toothbrush.  Wear shoes that have nonskid soles.  Use bath mats and handrails in your bathroom.  Wear gloves while you do yard work.  Wear a helmet when you ride a bike.  Wear your seat belt.  Prevent falls by removing loose rugs and extension cords from areas where you walk.  Do not play contact sports or participate in other activities that have a high risk of injury. [place] PROVIDER? Call your health care provider if:  You miss a dose of medicine: ? And you are not sure what to do. ? For more than one day.  You have: ? Menstrual bleeding that is heavier than normal. ? Blood in your urine. ? A bloody nose or bleeding gums. ? Easy bruising. ? Blood in your stool (feces) or have black and tarry stool. ? Side effects from your medicine.  You feel weak or dizzy.  You become pregnant. Seek immediate medical care if:  You have bleeding that will not stop.  You have sudden and severe headache or belly pain.  You vomit or you cough up bright red blood.  You have a severe blow to your head. WHAT ARE SOME QUESTIONS TO ASK MY HEALTH CARE PROVIDER?  What is the best anticoagulant therapy for my condition?  What side effects should I watch for?  When should I take my medicine? What should I do if I forget to take it?  Will I need to have regular blood tests?  Do I need to change my diet? Are there foods or drinks that I should avoid?  What activities are safe for me?  What should I do if I want to get pregnant? This information is not intended to replace advice given to you by your health care provider. Make sure you discuss any questions you have with your health care provider. Document Released: 11/24/2015 Document Reviewed: 11/24/2015 Elsevier Interactive Patient Education  2017 [employer].

## 2017-10-28 ENCOUNTER — Encounter: Payer: Self-pay | Admitting: Vascular Surgery

## 2017-10-28 ENCOUNTER — Ambulatory Visit (INDEPENDENT_AMBULATORY_CARE_PROVIDER_SITE_OTHER): Payer: BLUE CROSS/BLUE SHIELD | Admitting: Vascular Surgery

## 2017-10-28 VITALS — BP 122/78 | HR 50 | Temp 97.1°F | Resp 16 | Ht 74.0 in | Wt 227.0 lb

## 2017-10-28 DIAGNOSIS — I82412 Acute embolism and thrombosis of left femoral vein: Secondary | ICD-10-CM | POA: Diagnosis not present

## 2017-10-28 NOTE — Progress Notes (Signed)
Patient ID: Matthew Trujillo, male   DOB: 1951/02/13, 66 y.o.   MRN: [id number]  Reason for Consult: New Patient (Initial Visit) (2-3 wk f/u, ED f/u)   Referred by Marletta Lor, MD  Subjective:     HPI:  Matthew Trujillo is a 66 y.o. male recently seen in the hospital for extensive left lower extremity DVT.  At that time he is having significant swelling and pain.  He has known factor V Leiden and to take Coumadin in the past was recently not on any blood thinners.  He states since at this time in the hospital is swelling and pain are improving.  He has been wearing compression stockings on the left lower extremity.  He has noted some varicose veins.  He does feel a little bit heavy but otherwise has no symptoms other than the swelling.  Past Medical History:  Diagnosis Date  . ALLERGIC RHINITIS 05/21/2010  . ASTHMA 05/21/2010  . Asthma   . Factor V Leiden ([place])   . HYPERTENSION 05/21/2010   Diffiult to control  . 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 Cancer Father   . Cancer Brother   . Hypertension Mother   . Heart disease Neg Hx    Past Surgical History:  Procedure Laterality Date  . ACHILLES TENDON REPAIR    . DOPPLER ECHOCARDIOGRAPHY  12/30/2011   EF >55%; MILD LVH,MILD LAE,NORMAL DIASTOLIC FUNCTION  . RENAL DOPPLER  12/30/2011   NORMAL RENAL    Short Social History:  Social History  Substance Use Topics  . Smoking status: Never Smoker  . Smokeless tobacco: Never Used  . Alcohol use Yes     Comment: occasional     No Known Allergies  Current Outpatient Prescriptions  Medication Sig Dispense Refill  . albuterol (VENTOLIN HFA) 108 (90 BASE) MCG/ACT inhaler Inhale 2 puffs into the lungs every 6 (six) hours as needed. 1 Inhaler 6  . amLODipine (NORVASC) 10 MG tablet TAKE 1 TABLET BY MOUTH EVERY DAY (Patient taking differently: TAKE 10 mg TABLET BY MOUTH EVERY DAY) 90 tablet 3  . apixaban (ELIQUIS) 5 MG TABS tablet  Take 1 tablet (5 mg total) by mouth 2 (two) times daily. 180 tablet 3  . Calcium Carbonate-Vitamin D (CALTRATE 600+D) 600-400 MG-UNIT per tablet Take 1 tablet by mouth daily.      . chlorthalidone (HYGROTON) 50 MG tablet TAKE 1 TABLET BY MOUTH EVERY DAY 90 tablet 3  . Cholecalciferol (VITAMIN D-3) 5000 units TABS Take 5,000 Units by mouth daily.    . methimazole (TAPAZOLE) 5 MG tablet Take 5 mg by mouth 3 (three) times daily.    . metoprolol tartrate (LOPRESSOR) 50 MG tablet Take 1 tablet (50 mg total) by mouth 3 (three) times daily. 90 tablet 11  . montelukast (SINGULAIR) 10 MG tablet Take 10 mg by mouth daily as needed.     . Multiple Vitamin (MULTIVITAMIN) tablet Take 1 tablet by mouth daily.      . Omega-3 Fatty Acids (FISH OIL) 1000 MG CAPS Take by mouth 2 (two) times daily.     . ramipril (ALTACE) 10 MG capsule Take 10 mg by mouth daily.       Current Facility-Administered Medications  Medication Dose Route Frequency Provider Last Rate Last Dose  . albuterol (PROVENTIL) (2.5 MG/3ML) 0.083% nebulizer solution 5 mg  5 mg Nebulization Once Roselee Culver, MD      . ipratropium (  ATROVENT) nebulizer solution 0.5 mg  0.5 mg Nebulization Once Roselee Culver, MD        Review of Systems  Constitutional:  Constitutional negative. HENT: HENT negative.  Eyes: Eyes negative.  Respiratory: Respiratory negative.  Cardiovascular: Positive for leg swelling.  GI: Gastrointestinal negative.  Musculoskeletal: Positive for leg pain.  Neurological: Neurological negative. Hematologic: Hematologic/lymphatic negative.  Psychiatric: Psychiatric negative.        Objective:  Objective   Vitals:   10/28/17 1026  BP: 122/78  Pulse: (!) 50  Resp: 16  Temp: (!) 97.1 F (36.2 C)  SpO2: 99%  Weight: 227 lb (103 kg)  Height: 6\' 2"  (1.88 m)   Body mass index is 29.15 kg/m.  Physical Exam  Constitutional: He is oriented to person, place, and time. He appears well-developed.  HENT:    Head: Normocephalic.  Eyes: Pupils are equal, round, and reactive to light.  Neck: Normal range of motion.  Cardiovascular: Normal rate.   Pulses:      Dorsalis pedis pulses are 2+ on the right side, and 2+ on the left side.       Posterior tibial pulses are 2+ on the right side, and 2+ on the left side.  Pulmonary/Chest: Effort normal.  Abdominal: Soft. He exhibits no mass.  Musculoskeletal: He exhibits edema.  Non pitting edema left leg Varicose veins evident on lateral left leg  Neurological: He is alert and oriented to person, place, and time.  Skin: Skin is warm and dry.  Psychiatric: He has a normal mood and affect. His behavior is normal. Judgment and thought content normal.    Data: No new studies today     Assessment/Plan:     66 year old male with recent hospitalization for significant left lower extremity DVT.  His symptoms are improving there is no common femoral or iliac vein involvement.  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  I have also counseled him on ambulation activity as tolerated and wearing a compression stockings.  He demonstrates good understanding he will follow-up on a as needed basis.     Waynetta Sandy MD [facility]

## 2017-11-11 DIAGNOSIS — Z79899 Other long term (current) drug therapy: Secondary | ICD-10-CM | POA: Insufficient documentation

## 2017-11-30 ENCOUNTER — Ambulatory Visit: Payer: BLUE CROSS/BLUE SHIELD | Admitting: Cardiology

## 2017-11-30 ENCOUNTER — Encounter: Payer: Self-pay | Admitting: Cardiology

## 2017-11-30 VITALS — BP 126/60 | HR 51 | Ht 74.0 in | Wt 223.2 lb

## 2017-11-30 DIAGNOSIS — E0500 Thyrotoxicosis with diffuse goiter without thyrotoxic crisis or storm: Secondary | ICD-10-CM | POA: Diagnosis not present

## 2017-11-30 DIAGNOSIS — I10 Essential (primary) hypertension: Secondary | ICD-10-CM

## 2017-11-30 DIAGNOSIS — R600 Localized edema: Secondary | ICD-10-CM | POA: Diagnosis not present

## 2017-11-30 DIAGNOSIS — R002 Palpitations: Secondary | ICD-10-CM | POA: Diagnosis not present

## 2017-11-30 MED ORDER — METOPROLOL TARTRATE 50 MG PO TABS: 50.0000 mg | ORAL_TABLET | Freq: Two times a day (BID) | ORAL | 11 refills | Status: DC

## 2017-11-30 MED ORDER — RAMIPRIL 10 MG PO CAPS: 10.0000 mg | ORAL_CAPSULE | Freq: Every day | ORAL | 3 refills | Status: DC

## 2017-11-30 NOTE — Patient Instructions (Addendum)
MEDICATION INSTRUCTIONS   METOPROLOL 50 MG TWICE A DAY  RECOMMEND IF STILL DOING OKAY , PRIMARY CAN DECREASE TO 25 MG TWICE A DAY IN JAN 2019 APPOINTMENT.  Follow-up in April May timeframe

## 2017-11-30 NOTE — Progress Notes (Signed)
PCP: Marletta Lor, MD  Endo: Rodney Booze, MD [facility])  Clinic Note: Chief Complaint  Patient presents with  . Follow-up    pt denied chest pian and SOB    HPI: Matthew Trujillo is a 66 y.o. male with a PMH below who presents today for 82-month f/u for reassessment of palpitations associated with hyperthyroidism.  I had previously been seeing him for hypertension and venous stasis edema.  Matthew Trujillo was last seen in September --shortly after diagnosis of hyperthyroidism started on metoprolol and methimazole.  Still noticing jitteriness and palpitations.  We increase his metoprolol dose at that time.  Edema was well controlled with support stockings.  Otherwise doing well.  He frequently travels overseas (often travels to & from [place]).  Recent Hospitalizations: none -- Recently diagnosed with Hyperthyroidism -- started on metoprolol & methimazole -- with initial diagnosis, was having palpitations & fatigue/ jitteriness. It took a while until he was seen by Endocrinology.   Studies Personally Reviewed - (if available, images/films reviewed: From Epic Chart or Care Everywhere)  none  Interval History: Returns today overall feeling much better.  He has been tracking his blood pressure and heart rate.  He noted that as his thyroid levels were better controlled, he was able to back down on his metoprolol to twice a day.  In fact, he sometimes noticing a little bit of dizziness with his increased dose.  He indicates that his thyroid levels are very much in the normal range with T3 of 3.49, T4 of 0.8 and TSH 1.3.  He is due to follow-up again next month with endocrinology in order to determine what he does with his methimazole. He is no longer noticing any palpitations, indicating that as soon as he went up on the dose of metoprolol the symptoms abated.  He is now been weaned down.  As such, he has been doing much better from overall cardiac standpoint -> no further rapid  irregular heartbeat palpitations spells.  No associated shortness of breath or chest discomfort therefore.  No dizzy spells or syncope/near syncope.  No TIA or amaurosis fugax. No chest pain or pressure at rest or exertion.  No PND or orthopnea to go along with his chronic swelling.  The swelling is actually quite well controlled with chlorthalidone and support stockings.  He denies any claudication.  ROS: A comprehensive was performed.  Most + symptoms are improving with Thyroid Rx.  Review of Systems  Constitutional: Negative for malaise/fatigue (Improving ).  HENT: Negative for congestion, nosebleeds and sore throat (No longer).   Respiratory: Negative for cough, shortness of breath and wheezing.   Cardiovascular: Positive for leg swelling (per HPI). Negative for palpitations.  Gastrointestinal: Negative for blood in stool and melena.  Genitourinary: Negative for hematuria.  Musculoskeletal: Negative for back pain and joint pain.  Neurological: Negative for tremors and headaches.  Endo/Heme/Allergies: Positive for environmental allergies. Does not bruise/bleed easily.  Psychiatric/Behavioral: Negative for depression and memory loss.       Doing much better now that his thyroid is improved.  All other systems reviewed and are negative.  I have reviewed and (if needed) personally updated the patient's problem list, medications, allergies, past medical and surgical history, social and family history.   Past Medical History:  Diagnosis Date  . ALLERGIC RHINITIS 05/21/2010  . ASTHMA 05/21/2010  . Asthma   . Factor V Leiden ([place])   . HYPERTENSION 05/21/2010   Diffiult to control  . PULMONARY EMBOLISM 05/21/2010  Past Surgical History:  Procedure Laterality Date  . ACHILLES TENDON REPAIR    . DOPPLER ECHOCARDIOGRAPHY  12/30/2011   EF >55%; MILD LVH,MILD LAE,NORMAL DIASTOLIC FUNCTION  . RENAL DOPPLER  12/30/2011   NORMAL RENAL    Current Meds  Medication Sig  . albuterol (VENTOLIN  HFA) 108 (90 BASE) MCG/ACT inhaler Inhale 2 puffs into the lungs every 6 (six) hours as needed.  Marland Kitchen amLODipine (NORVASC) 10 MG tablet TAKE 1 TABLET BY MOUTH EVERY DAY (Patient taking differently: TAKE 10 mg TABLET BY MOUTH EVERY DAY)  . apixaban (ELIQUIS) 5 MG TABS tablet Take 1 tablet (5 mg total) by mouth 2 (two) times daily.  . Calcium Carbonate-Vitamin D (CALTRATE 600+D) 600-400 MG-UNIT per tablet Take 1 tablet by mouth daily.    . chlorthalidone (HYGROTON) 50 MG tablet TAKE 1 TABLET BY MOUTH EVERY DAY  . Cholecalciferol (VITAMIN D-3) 5000 units TABS Take 5,000 Units by mouth daily.  . methimazole (TAPAZOLE) 5 MG tablet Take 5 mg by mouth 3 (three) times daily.  . metoprolol tartrate (LOPRESSOR) 50 MG tablet Take 1 tablet (50 mg total) by mouth 2 (two) times daily.  . montelukast (SINGULAIR) 10 MG tablet Take 10 mg by mouth daily as needed.   . Multiple Vitamin (MULTIVITAMIN) tablet Take 1 tablet by mouth daily.    . Omega-3 Fatty Acids (FISH OIL) 1000 MG CAPS Take by mouth 2 (two) times daily.   . ramipril (ALTACE) 10 MG capsule Take 1 capsule (10 mg total) by mouth daily.  . [DISCONTINUED] metoprolol tartrate (LOPRESSOR) 50 MG tablet Take 1 tablet (50 mg total) by mouth 3 (three) times daily.  . [DISCONTINUED] ramipril (ALTACE) 10 MG capsule Take 10 mg by mouth daily.     Current Facility-Administered Medications for the 11/30/17 encounter (Office Visit) with Leonie Man, MD  Medication  . albuterol (PROVENTIL) (2.5 MG/3ML) 0.083% nebulizer solution 5 mg  . ipratropium (ATROVENT) nebulizer solution 0.5 mg    No Known Allergies  Social History   Socioeconomic History  . Marital status: Married    Spouse name: None  . Number of children: None  . Years of education: None  . Highest education level: None  Social Needs  . Financial resource strain: None  . Food insecurity - worry: None  . Food insecurity - inability: None  . Transportation needs - medical: None  .  Transportation needs - non-medical: None  Occupational History  . None  Tobacco Use  . Smoking status: Never Smoker  . Smokeless tobacco: Never Used  Substance and Sexual Activity  . Alcohol use: Yes    Comment: occasional   . Drug use: No  . Sexual activity: None  Other Topics Concern  . None  Social History Narrative   He is a married father of 2. He does exercise about 3-4 days a week with no true symptoms. Does not smoke and occasional social alcohol.     family history includes Cancer in his brother and father; Hypertension in his mother.  Wt Readings from Last 3 Encounters:  11/30/17 223 lb 3.2 oz (101.2 kg)  10/28/17 227 lb (103 kg)  10/26/17 228 lb 9.6 oz (103.7 kg)    PHYSICAL EXAM BP 126/60   Pulse (!) 51   Ht 6\' 2"  (1.88 m)   Wt 223 lb 3.2 oz (101.2 kg)   BMI 28.66 kg/m  Physical Exam  Constitutional: He is oriented to person, place, and time. He appears well-developed and well-nourished. No  distress.  well groomed.  HENT:  Head: Normocephalic and atraumatic.  Mouth/Throat: No oropharyngeal exudate.  Eyes:  No exophthalmos  Neck: No hepatojugular reflux and no JVD present. Carotid bruit is not present. No thyromegaly present.  Cardiovascular: Normal rate, regular rhythm, normal heart sounds and intact distal pulses. PMI is not displaced. Exam reveals no gallop and no friction rub.  No murmur heard. Pulmonary/Chest: Effort normal and breath sounds normal. No respiratory distress. He has no wheezes. He has no rales.  Abdominal: Soft. Bowel sounds are normal. He exhibits no distension. There is no tenderness. There is no rebound.  Musculoskeletal: He exhibits edema (~1+ bilateral).  Support stockings in place  Neurological: He is alert and oriented to person, place, and time.  Skin: Skin is warm and dry. No rash noted. No erythema.  Psychiatric: He has a normal mood and affect. His behavior is normal. Judgment and thought content normal.  Nursing note and  vitals reviewed.    Adult ECG Report Not checked  Other studies Reviewed: Additional studies/ records that were reviewed today include:  Recent Labs: He has new labs.  But I do not have the full results available.  Most notable results of her thyroid functions discussed above.    ASSESSMENT / PLAN: Problem List Items Addressed This Visit    Bilateral edema of lower extremity    Well controlled with support stockings and chlorthalidone.  He also does a good job of elevating his feet.  Probably related to venous stasis.      Essential hypertension (Chronic)    Excellent blood pressure on the combination of amlodipine, ramipril and chlorthalidone plus metoprolol.      Relevant Medications   ramipril (ALTACE) 10 MG capsule   metoprolol tartrate (LOPRESSOR) 50 MG tablet   Graves disease (Chronic)    Seems to be improving with methimazole.  Able to back down his beta-blocker dose.  TSH, free T4 4 and T3 are all normal.  Due to have more adjustments in a month.      Relevant Medications   metoprolol tartrate (LOPRESSOR) 50 MG tablet   Palpitations - Primary    Overall much better control now that his thyroid levels are controlled.  He is currently taking 50 mg twice daily of metoprolol, as suspected as everything stabilizes he can probably reduce down to 25 mg twice daily to avoid fatigue         Current medicines are reviewed at length with the patient today. (+/- concerns) concerned about BP levels not as well controlled. The following changes have been made: increase Metoprolol to 50 mg tid  Patient Instructions  MEDICATION INSTRUCTIONS   METOPROLOL 50 MG TWICE A DAY  RECOMMEND IF STILL DOING OKAY , PRIMARY CAN DECREASE TO 25 MG TWICE A DAY IN JAN 2019 APPOINTMENT.  Follow-up in April May timeframe   Studies Ordered:   No orders of the defined types were placed in this encounter.     Glenetta Hew, M.D., M.S. Interventional Cardiologist   Pager #  [phone] Phone # [phone] [address] [place], [place] [address]

## 2017-12-02 NOTE — Assessment & Plan Note (Signed)
Excellent blood pressure on the combination of amlodipine, ramipril and chlorthalidone plus metoprolol.

## 2017-12-02 NOTE — Assessment & Plan Note (Signed)
Well controlled with support stockings and chlorthalidone.  He also does a good job of elevating his feet.  Probably related to venous stasis.

## 2017-12-02 NOTE — Assessment & Plan Note (Signed)
Overall much better control now that his thyroid levels are controlled.  He is currently taking 50 mg twice daily of metoprolol, as suspected as everything stabilizes he can probably reduce down to 25 mg twice daily to avoid fatigue

## 2017-12-02 NOTE — Assessment & Plan Note (Signed)
Seems to be improving with methimazole.  Able to back down his beta-blocker dose.  TSH, free T4 4 and T3 are all normal.  Due to have more adjustments in a month.

## 2018-03-03 ENCOUNTER — Other Ambulatory Visit: Payer: Self-pay | Admitting: Internal Medicine

## 2018-04-25 ENCOUNTER — Encounter: Payer: Self-pay | Admitting: Internal Medicine

## 2018-04-25 ENCOUNTER — Ambulatory Visit (INDEPENDENT_AMBULATORY_CARE_PROVIDER_SITE_OTHER): Payer: BLUE CROSS/BLUE SHIELD | Admitting: Internal Medicine

## 2018-04-25 VITALS — BP 110/70 | HR 53 | Temp 97.6°F | Ht 73.5 in | Wt 235.0 lb

## 2018-04-25 DIAGNOSIS — Z0000 Encounter for general adult medical examination without abnormal findings: Secondary | ICD-10-CM

## 2018-04-25 LAB — COMPREHENSIVE METABOLIC PANEL
ALBUMIN: 4.5 g/dL (ref 3.5–5.2)
ALK PHOS: 55 U/L (ref 39–117)
ALT: 34 U/L (ref 0–53)
AST: 26 U/L (ref 0–37)
BILIRUBIN TOTAL: 0.9 mg/dL (ref 0.2–1.2)
BUN: 17 mg/dL (ref 6–23)
CO2: 27 mEq/L (ref 19–32)
Calcium: 10.1 mg/dL (ref 8.4–10.5)
Chloride: 101 mEq/L (ref 96–112)
Creatinine, Ser: 0.84 mg/dL (ref 0.40–1.50)
GFR: 97.52 mL/min (ref >60.00)
GLUCOSE: 89 mg/dL (ref 70–99)
Potassium: 4 mEq/L (ref 3.5–5.1)
Sodium: 137 mEq/L (ref 135–145)
TOTAL PROTEIN: 6.9 g/dL (ref 6.0–8.3)

## 2018-04-25 LAB — CBC WITH DIFFERENTIAL/PLATELET
Basophils Absolute: 0.1 10*3/uL (ref 0.0–0.1)
Basophils Relative: 1.2 % (ref 0.0–3.0)
Eosinophils Absolute: 0.2 10*3/uL (ref 0.0–0.7)
Eosinophils Relative: 2.7 % (ref 0.0–5.0)
HCT: 43.9 % (ref 39.0–52.0)
HEMOGLOBIN: 15.3 g/dL (ref 13.0–17.0)
LYMPHS ABS: 4 10*3/uL (ref 0.7–4.0)
Lymphocytes Relative: 47.3 % — ABNORMAL HIGH (ref 12.0–46.0)
MCHC: 34.9 g/dL (ref 30.0–36.0)
MCV: 90.5 fl (ref 78.0–100.0)
MONO ABS: 0.7 10*3/uL (ref 0.1–1.0)
MONOS PCT: 8.2 % (ref 3.0–12.0)
NEUTROS PCT: 40.6 % — AB (ref 43.0–77.0)
Neutro Abs: 3.5 10*3/uL (ref 1.4–7.7)
Platelets: 337 10*3/uL (ref 150.0–400.0)
RBC: 4.86 Mil/uL (ref 4.22–5.81)
RDW: 13.8 % (ref 11.5–15.5)
WBC: 8.5 10*3/uL (ref 4.0–10.5)

## 2018-04-25 LAB — LIPID PANEL
CHOLESTEROL: 207 mg/dL — AB (ref 0–200)
HDL: 41.3 mg/dL (ref >39.00)
LDL Cholesterol: 135 mg/dL — ABNORMAL HIGH (ref 0–99)
NONHDL: 165.2
TRIGLYCERIDES: 150 mg/dL — AB (ref 0.0–149.0)
Total CHOL/HDL Ratio: 5
VLDL: 30 mg/dL (ref 0.0–40.0)

## 2018-04-25 LAB — TSH: TSH: 3.47 u[IU]/mL (ref 0.35–4.50)

## 2018-04-25 NOTE — Patient Instructions (Signed)
Limit your sodium (Salt) intake  Schedule your colonoscopy to help detect colon cancer.  Please check your blood pressure on a regular basis.  If it is consistently greater than 140/90, please make an office appointment.    It is important that you exercise regularly, at least 20 minutes 3 to 4 times per week.  If you develop chest pain or shortness of breath seek  medical attention.  Endocrinology follow-up as scheduled   Return in 1 year for follow-up

## 2018-04-25 NOTE — Progress Notes (Signed)
Subjective:    Patient ID: Matthew Trujillo, male    DOB: 1951/08/23, 67 y.o.   MRN: [id number]  HPI  67 year old patient who is seen today for a annual preventive health examination He has a history of essential hypertension as well as allergic rhinitis with asthma.  He has done quite well.  He is also followed by endocrinology with history of Graves' and remains on Tapazole. 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 He had a small adenomatous polyp removed in 2012   social history recently retired.  Wife plans to retire at the end of this summer  Past Medical History:  Diagnosis Date  . ALLERGIC RHINITIS 05/21/2010  . ASTHMA 05/21/2010  . Asthma   . Factor V Leiden ([place])   . HYPERTENSION 05/21/2010   Diffiult to control  . PULMONARY EMBOLISM 05/21/2010     Social History   Socioeconomic History  . Marital status: Married    Spouse name: Not on file  . Number of children: Not on file  . Years of education: Not on file  . Highest education level: Not on file  Occupational History  . Not on file  Social Needs  . Financial resource strain: Not on file  . Food insecurity:    Worry: Not on file    Inability: Not on file  . Transportation needs:    Medical: Not on file    Non-medical: Not on file  Tobacco Use  . Smoking status: Never Smoker  . Smokeless tobacco: Never Used  Substance and Sexual Activity  . Alcohol use: Yes    Comment: occasional   . Drug use: No  . Sexual activity: Not on file  Lifestyle  . Physical activity:    Days per week: Not on file    Minutes per session: Not on file  . Stress: Not on file  Relationships  . Social connections:    Talks on phone: Not on file    Gets together: Not on file    Attends religious service: Not on file    Active member of club or organization: Not on file    Attends meetings of clubs or organizations: Not on file    Relationship  status: Not on file  . Intimate partner violence:    Fear of current or ex partner: Not on file    Emotionally abused: Not on file    Physically abused: Not on file    Forced sexual activity: Not on file  Other Topics Concern  . Not on file  Social History Narrative   He is a married father of 2. He does exercise about 3-4 days a week with no true symptoms. Does not smoke and occasional social alcohol.     Past Surgical History:  Procedure Laterality Date  . ACHILLES TENDON REPAIR    . DOPPLER ECHOCARDIOGRAPHY  12/30/2011   EF >55%; MILD LVH,MILD LAE,NORMAL DIASTOLIC FUNCTION  . RENAL DOPPLER  12/30/2011   NORMAL RENAL    Family History  Problem Relation Age of Onset  . Cancer Father   . Cancer Brother   . Hypertension Mother   . Heart disease Neg Hx     No Known Allergies  Current Outpatient Medications on File Prior to Visit  Medication Sig Dispense Refill  . albuterol (PROVENTIL HFA;VENTOLIN HFA) 108 (90 Base) MCG/ACT inhaler INHALE 2 PUFFS EVERY 4 HOURS AS NEEDED WHEEZING 1 Inhaler  2  . amLODipine (NORVASC) 10 MG tablet TAKE 1 TABLET BY MOUTH EVERY DAY (Patient taking differently: TAKE 10 mg TABLET BY MOUTH EVERY DAY) 90 tablet 3  . apixaban (ELIQUIS) 5 MG TABS tablet Take 1 tablet (5 mg total) by mouth 2 (two) times daily. 180 tablet 3  . Calcium Carbonate-Vitamin D (CALTRATE 600+D) 600-400 MG-UNIT per tablet Take 1 tablet by mouth daily.      . chlorthalidone (HYGROTON) 50 MG tablet TAKE 1 TABLET BY MOUTH EVERY DAY 90 tablet 3  . Cholecalciferol (VITAMIN D-3) 5000 units TABS Take 5,000 Units by mouth daily.    . methimazole (TAPAZOLE) 5 MG tablet Take 5 mg by mouth 3 (three) times daily.    . metoprolol tartrate (LOPRESSOR) 25 MG tablet Take 25 mg by mouth 2 (two) times daily.    . montelukast (SINGULAIR) 10 MG tablet Take 10 mg by mouth daily as needed.     . Multiple Vitamin (MULTIVITAMIN) tablet Take 1 tablet by mouth daily.      . Omega-3 Fatty Acids (FISH OIL)  1000 MG CAPS Take by mouth 2 (two) times daily.     . ramipril (ALTACE) 10 MG capsule Take 1 capsule (10 mg total) by mouth daily. 90 capsule 3   Current Facility-Administered Medications on File Prior to Visit  Medication Dose Route Frequency Provider Last Rate Last Dose  . albuterol (PROVENTIL) (2.5 MG/3ML) 0.083% nebulizer solution 5 mg  5 mg Nebulization Once Roselee Culver, MD      . ipratropium (ATROVENT) nebulizer solution 0.5 mg  0.5 mg Nebulization Once Roselee Culver, MD        BP 110/70 (BP Location: Right Arm, Patient Position: Sitting, Cuff Size: Large)   Pulse (!) 53   Temp 97.6 F (36.4 C) (Oral)   Ht 6' 1.5" (1.867 m)   Wt 235 lb (106.6 kg)   SpO2 99%   BMI 30.58 kg/m       Review of Systems  Constitutional: Negative for appetite change, chills, fatigue and fever.  HENT: Negative for congestion, dental problem, ear pain, hearing loss, sore throat, tinnitus, trouble swallowing and voice change.   Eyes: Negative for pain, discharge and visual disturbance.  Respiratory: Negative for cough, chest tightness, wheezing and stridor.   Cardiovascular: Negative for chest pain, palpitations and leg swelling.  Gastrointestinal: Negative for abdominal distention, abdominal pain, blood in stool, constipation, diarrhea, nausea and vomiting.  Genitourinary: Negative for difficulty urinating, discharge, flank pain, genital sores, hematuria and urgency.  Musculoskeletal: Negative for arthralgias, back pain, gait problem, joint swelling, myalgias and neck stiffness.  Skin: Negative for rash.  Neurological: Negative for dizziness, syncope, speech difficulty, weakness, numbness and headaches.  Hematological: Negative for adenopathy. Does not bruise/bleed easily.  Psychiatric/Behavioral: Negative for behavioral problems and dysphoric mood. The patient is not nervous/anxious.        Objective:   Physical Exam  Constitutional: He appears well-developed and well-nourished.    HENT:  Head: Normocephalic and atraumatic.  Right Ear: External ear normal.  Left Ear: External ear normal.  Nose: Nose normal.  Mouth/Throat: Oropharynx is clear and moist.  Eyes: Pupils are equal, round, and reactive to light. Conjunctivae and EOM are normal. No scleral icterus.  Neck: Normal range of motion. Neck supple. No JVD present. No thyromegaly present.  Cardiovascular: Regular rhythm, normal heart sounds and intact distal pulses. Exam reveals no gallop and no friction rub.  No murmur heard. Decreased left dorsalis pedis pulse  Pulmonary/Chest:  Effort normal and breath sounds normal. He exhibits no tenderness.  Abdominal: Soft. Bowel sounds are normal. He exhibits no distension and no mass. There is no tenderness.  Umbilical hernia  Genitourinary: Penis normal.  Musculoskeletal: Normal range of motion. He exhibits no edema or tenderness.  Lymphadenopathy:    He has no cervical adenopathy.  Neurological: He is alert. He has normal reflexes. No cranial nerve deficit. Coordination normal.  Skin: Skin is warm and dry. No rash noted.  Psychiatric: He has a normal mood and affect. His behavior is normal.          Assessment & Plan:   Preventive health examination Hypertension well controlled Obesity weight loss encouraged Recurrent DVT.  Continue chronic anticoagulation History of colonic polyp.  We will schedule follow-up colonoscopy Graves' disease.  Follow-up endocrinology.  Continue antithyroid medication History of asthma/allergic rhinitis.  Stable on present medications  Return in 1 year for follow-up or as needed  Nyoka Cowden

## 2018-04-26 LAB — HEPATITIS C ANTIBODY
Hepatitis C Ab: NONREACTIVE
SIGNAL TO CUT-OFF: 0.01 (ref <1.00)

## 2018-04-27 ENCOUNTER — Encounter: Payer: Self-pay | Admitting: Internal Medicine

## 2018-05-04 ENCOUNTER — Ambulatory Visit: Payer: BLUE CROSS/BLUE SHIELD | Admitting: Cardiology

## 2018-05-04 ENCOUNTER — Encounter: Payer: Self-pay | Admitting: Cardiology

## 2018-05-04 VITALS — BP 115/78 | HR 63 | Ht 73.5 in | Wt 234.4 lb

## 2018-05-04 DIAGNOSIS — I82402 Acute embolism and thrombosis of unspecified deep veins of left lower extremity: Secondary | ICD-10-CM | POA: Diagnosis not present

## 2018-05-04 DIAGNOSIS — E0500 Thyrotoxicosis with diffuse goiter without thyrotoxic crisis or storm: Secondary | ICD-10-CM | POA: Diagnosis not present

## 2018-05-04 DIAGNOSIS — I10 Essential (primary) hypertension: Secondary | ICD-10-CM | POA: Diagnosis not present

## 2018-05-04 DIAGNOSIS — R002 Palpitations: Secondary | ICD-10-CM

## 2018-05-04 DIAGNOSIS — E785 Hyperlipidemia, unspecified: Secondary | ICD-10-CM | POA: Diagnosis not present

## 2018-05-04 DIAGNOSIS — E669 Obesity, unspecified: Secondary | ICD-10-CM

## 2018-05-04 NOTE — Progress Notes (Signed)
PCP: Marletta Lor, MD  Endo: Rodney Booze, MD [facility]  Clinic Note: Chief Complaint  Patient presents with  . Follow-up    No complaints  . Palpitations    Controlled  . Hypertension    Controlled    HPI: Matthew Trujillo is a 67 y.o. male with a PMH below who presents today for 65-month f/u for reassessment of palpitations associated with hyperthyroidism.  I had previously been seeing him for hypertension and venous stasis edema. I saw him for a work-in visit last September --shortly after diagnosis of hyperthyroidism started on metoprolol and methimazole.  Still noticing jitteriness and palpitations.  We increase his metoprolol dose at that time.  Edema was well controlled with support stockings.  Otherwise doing well.  He frequently travels overseas (often travels to & from [place]).  GURTAJ RUZ was last seen in Dec 2018 - Sx notably improved with improvement in thyroid control - was able to titrate back down the Beta Blocker doses to Lopressor 25 mg BID.  Recent Hospitalizations: none  Studies Personally Reviewed - (if available, images/films reviewed: From Epic Chart or Care Everywhere)  none  Interval History: Returns today doing quite well.  He still has a little bit of swelling on the left leg where he had the DVT, but it goes down at night.  Usually, the swelling is well controlled with  the addition of chlorthalidone. Overall doing well from a cardiac standpoint, back being active with no recurrent palpitations or irregular heartbeats.  His energy level is doing fine especially with reduced beta-blocker dose.  Cardiovascular Review of Symptoms: no chest pain or dyspnea on exertion positive for - edema negative for - irregular heartbeat, orthopnea, palpitations, paroxysmal nocturnal dyspnea, rapid heart rate, shortness of breath or Syncope/near syncope, TIA/amaurosis fugax, melena, hematochezia, hematuria, epistaxis.  Claudication  --Edema controlled  with chlorthalidone and support stockings.  He denies any claudication.  ROS: A comprehensive was performed.  Most + symptoms are improving with Thyroid Rx.  Review of Systems  Constitutional: Negative for malaise/fatigue (Back to baseline).  HENT: Negative for congestion and sore throat (No longer).   Respiratory: Negative for cough, shortness of breath and wheezing.   Cardiovascular: Positive for leg swelling (per HPI). Negative for palpitations.  Gastrointestinal: Negative for abdominal pain and constipation.  Musculoskeletal: Negative for back pain and joint pain.  Neurological: Negative for dizziness and tremors.  Endo/Heme/Allergies: Positive for environmental allergies. Does not bruise/bleed easily.  Psychiatric/Behavioral:       Doing much better now that his thyroid is improved.  All other systems reviewed and are negative.  I have reviewed and (if needed) personally updated the patient's problem list, medications, allergies, past medical and surgical history, social and family history.   Past Medical History:  Diagnosis Date  . ALLERGIC RHINITIS 05/21/2010  . ASTHMA 05/21/2010  . Asthma   . Factor V Leiden ([place])   . HYPERTENSION 05/21/2010   Diffiult to control  . 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 ACHILLES TENDON REPAIR    . DOPPLER ECHOCARDIOGRAPHY  12/30/2011   EF >55%; MILD LVH,MILD LAE,NORMAL DIASTOLIC FUNCTION  . RENAL DOPPLER  12/30/2011   NORMAL RENAL    Current Meds  Medication Sig  . albuterol (PROVENTIL HFA;VENTOLIN HFA) 108 (90 Base) MCG/ACT inhaler INHALE 2 PUFFS EVERY 4 HOURS AS NEEDED WHEEZING  . amLODipine (NORVASC) 10 MG tablet TAKE 1 TABLET BY MOUTH EVERY DAY (Patient taking  differently: TAKE 10 mg TABLET BY MOUTH EVERY DAY)  . apixaban (ELIQUIS) 5 MG TABS tablet Take 1 tablet (5 mg total) by mouth 2 (two) times daily.  . Calcium Carbonate-Vitamin D (CALTRATE 600+D) 600-400 MG-UNIT per tablet Take 1  tablet by mouth daily.    . chlorthalidone (HYGROTON) 50 MG tablet TAKE 1 TABLET BY MOUTH EVERY DAY  . Cholecalciferol (VITAMIN D-3) 5000 units TABS Take 5,000 Units by mouth daily.  . methimazole (TAPAZOLE) 5 MG tablet Take 5 mg by mouth daily.  . metoprolol tartrate (LOPRESSOR) 25 MG tablet Take 25 mg by mouth 2 (two) times daily.  . montelukast (SINGULAIR) 10 MG tablet Take 10 mg by mouth daily as needed.   . Multiple Vitamin (MULTIVITAMIN) tablet Take 1 tablet by mouth daily.    . Omega-3 Fatty Acids (FISH OIL) 1000 MG CAPS Take by mouth 2 (two) times daily.   . ramipril (ALTACE) 10 MG capsule Take 1 capsule (10 mg total) by mouth daily.   Current Facility-Administered Medications for the 05/04/18 encounter (Office Visit) with Leonie Man, MD  Medication  . albuterol (PROVENTIL) (2.5 MG/3ML) 0.083% nebulizer solution 5 mg  . ipratropium (ATROVENT) nebulizer solution 0.5 mg    No Known Allergies  Social History   Tobacco Use  . Smoking status: Never Smoker  . Smokeless tobacco: Never Used  Substance Use Topics  . Alcohol use: Yes    Comment: occasional   . Drug use: No   Social History   Social History Narrative   He is a married father of 2. He does exercise about 3-4 days a week with no true symptoms. Does not smoke and occasional social alcohol.      family history includes Cancer in his brother and father; Hypertension in his mother.  Wt Readings from Last 3 Encounters:  05/04/18 234 lb 6.4 oz (106.3 kg)  04/25/18 235 lb (106.6 kg)  11/30/17 223 lb 3.2 oz (101.2 kg)    PHYSICAL EXAM BP 115/78   Pulse 63   Ht 6' 1.5" (1.867 m)   Wt 234 lb 6.4 oz (106.3 kg)   BMI 30.51 kg/m  Physical Exam  Constitutional: He is oriented to person, place, and time. He appears well-developed and well-nourished. No distress.  well groomed.  HENT:  Head: Normocephalic and atraumatic.  Eyes:  No exophthalmos  Neck: No hepatojugular reflux and no JVD present. Carotid bruit  is not present. No thyromegaly present.  Cardiovascular: Normal rate, regular rhythm, normal heart sounds and intact distal pulses. PMI is not displaced. Exam reveals no gallop and no friction rub.  No murmur heard. Pulmonary/Chest: Effort normal and breath sounds normal. No respiratory distress. He has no wheezes. He has no rales.  Abdominal: Soft. Bowel sounds are normal. He exhibits no distension. There is no tenderness. There is no rebound.  Musculoskeletal: Normal range of motion. He exhibits edema (~1+ L > R).  Support stockings in place  Neurological: He is alert and oriented to person, place, and time.  Psychiatric: He has a normal mood and affect. His behavior is normal. Judgment and thought content normal.  Vitals reviewed.    Adult ECG Report Not checked  Other studies Reviewed: Additional studies/ records that were reviewed today include:  Recent Labs:  Lab Results  Component Value Date   CREATININE 0.84 04/25/2018   BUN 17 04/25/2018   NA 137 04/25/2018   K 4.0 04/25/2018   CL 101 04/25/2018   CO2 27 04/25/2018  Lab Results  Component Value Date   CHOL 207 (H) 04/25/2018   HDL 41.30 04/25/2018   LDLCALC 135 (H) 04/25/2018   TRIG 150.0 (H) 04/25/2018   CHOLHDL 5 04/25/2018   Lab Results  Component Value Date   TSH 3.47 04/25/2018   Lab Results  Component Value Date   WBC 8.5 04/25/2018   HGB 15.3 04/25/2018   HCT 43.9 04/25/2018   MCV 90.5 04/25/2018   PLT 337.0 04/25/2018     ASSESSMENT / PLAN: Problem List Items Addressed This Visit    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  Now on chronic anti-correlation with Eliquis. 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      Palpitations - Primary (Chronic)    Now well controlled now with the improvement in thyroid function.  Successfully titrated down  to 25 mg twice daily of metoprolol.  Can take additional dose as needed if necessary.        RESOLVED: Obesity (BMI 30.0-34.9) (Chronic)   Hyperlipidemia with target LDL less than 100 (Chronic)    Not at goal with LDL 135 recently.  He is not currently on a statin. He would like to try to do dietary adjustment, however I suspect that he probably would benefit from statin.  Defer treatment plan to PCP.      Graves disease (Chronic)    Well-controlled on methimazole.  Able to titrate beta-blocker dose down.      Relevant Medications   methimazole (TAPAZOLE) 5 MG tablet   Essential hypertension (Chronic)    Well-controlled on current meds.  Chlorthalidone helps her blood pressure but also helps his edema.         Current medicines are reviewed at length with the patient today. (+/- concerns)  The following changes have been made: Patient Instructions  NO CHANGES  IN MEDICATION     Your physician wants you to follow-up in [place].You will receive a reminder letter in the mail two months in advance. If you don't receive a letter, please call our office to schedule the follow-up appointment.     If you need a refill on your cardiac medications before your next appointment, please call your pharmacy.                               Studies Ordered:   No orders of the defined types were placed in this encounter.     Glenetta Hew, M.D., M.S. Interventional Cardiologist   Pager # [phone] Phone # [phone] [address] [place], [place] [address]

## 2018-05-04 NOTE — Patient Instructions (Signed)
NO CHANGES  IN MEDICATION     Your physician wants you to follow-up in [place].You will receive a reminder letter in the mail two months in advance. If you don't receive a letter, please call our office to schedule the follow-up appointment.     If you need a refill on your cardiac medications before your next appointment, please call your pharmacy.

## 2018-05-08 ENCOUNTER — Encounter: Payer: Self-pay | Admitting: Cardiology

## 2018-05-08 NOTE — Assessment & Plan Note (Addendum)
Not at goal with LDL 135 recently.  He is not currently on a statin. He would like to try to do dietary adjustment, however I suspect that he probably would benefit from statin.  Defer treatment plan to PCP.

## 2018-05-08 NOTE — Assessment & Plan Note (Signed)
Recurrent DVT of the left leg last October.  Now on chronic anti-correlation with Eliquis. 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

## 2018-05-08 NOTE — Assessment & Plan Note (Addendum)
Well-controlled on current meds.  Chlorthalidone helps her blood pressure but also helps his edema.

## 2018-05-08 NOTE — Assessment & Plan Note (Signed)
Now well controlled now with the improvement in thyroid function.  Successfully titrated down to 25 mg twice daily of metoprolol.  Can take additional dose as needed if necessary.

## 2018-05-08 NOTE — Assessment & Plan Note (Signed)
Well-controlled on methimazole.  Able to titrate beta-blocker dose down.

## 2018-06-19 ENCOUNTER — Ambulatory Visit: Payer: Self-pay | Admitting: Internal Medicine

## 2018-08-16 ENCOUNTER — Encounter: Payer: Self-pay | Admitting: Family Medicine

## 2018-08-16 ENCOUNTER — Ambulatory Visit (INDEPENDENT_AMBULATORY_CARE_PROVIDER_SITE_OTHER): Payer: Medicare HMO | Admitting: Family Medicine

## 2018-08-16 VITALS — BP 118/64 | HR 61 | Temp 98.1°F | Ht 73.5 in | Wt 224.2 lb

## 2018-08-16 DIAGNOSIS — Z23 Encounter for immunization: Secondary | ICD-10-CM

## 2018-08-16 DIAGNOSIS — I10 Essential (primary) hypertension: Secondary | ICD-10-CM

## 2018-08-16 DIAGNOSIS — J4521 Mild intermittent asthma with (acute) exacerbation: Secondary | ICD-10-CM

## 2018-08-16 DIAGNOSIS — E0500 Thyrotoxicosis with diffuse goiter without thyrotoxic crisis or storm: Secondary | ICD-10-CM | POA: Diagnosis not present

## 2018-08-16 DIAGNOSIS — M8580 Other specified disorders of bone density and structure, unspecified site: Secondary | ICD-10-CM | POA: Diagnosis not present

## 2018-08-16 DIAGNOSIS — R002 Palpitations: Secondary | ICD-10-CM | POA: Diagnosis not present

## 2018-08-16 DIAGNOSIS — E785 Hyperlipidemia, unspecified: Secondary | ICD-10-CM

## 2018-08-16 DIAGNOSIS — I82402 Acute embolism and thrombosis of unspecified deep veins of left lower extremity: Secondary | ICD-10-CM

## 2018-08-16 DIAGNOSIS — M859 Disorder of bone density and structure, unspecified: Secondary | ICD-10-CM

## 2018-08-16 DIAGNOSIS — R69 Illness, unspecified: Secondary | ICD-10-CM | POA: Diagnosis not present

## 2018-08-16 MED ORDER — PREDNISONE 20 MG PO TABS: 40.0000 mg | ORAL_TABLET | Freq: Every day | ORAL | 0 refills | Status: AC

## 2018-08-16 MED ORDER — ZOSTER VAC RECOMB ADJUVANTED 50 MCG/0.5ML IM SUSR: 0.5000 mL | Freq: Once | INTRAMUSCULAR | 0 refills | Status: AC

## 2018-08-16 MED ORDER — E-Z SPACER DEVI: 2 refills | Status: AC

## 2018-08-16 MED ORDER — METOPROLOL TARTRATE 25 MG PO TABS: 12.5000 mg | ORAL_TABLET | Freq: Two times a day (BID) | ORAL | Status: DC

## 2018-08-16 NOTE — Progress Notes (Signed)
Matthew Trujillo DOB: November 19, 1951 Encounter date: 08/16/2018  This isa 67 y.o. male who presents to establish care. Chief Complaint  Patient presents with  . Transitions Of Care    asthma, needs immunologist for Graves, disscuss vaccines,     History of present illness:  When spoke with former doc regarding vaccines - told him to start at 21. Ready for shingrix, tetanus, and pneumonia.  Asthma: Last few weeks have been rough with asthma. Allergy flare due to bed and breakfast he was staying at (animal exposure); time to recover from flare is longer than expected. Also increased stress due to father passing away/son getting married. Sometimes waking up at night to use inhaler. Just can't turn off allergic response. Using inhaler 2 puffs in morning and 2 puffs in afternoon and then 2 puffs at bedtime. Last 5 nights waking up and needing mid night use. Has gone through 2 boxes of mucinex and 2 bottles cough syrup. Uses more in fall and spring typically. In summer/winter can go month without using. Still taking singulair. Has used controller inhaler in past; stopped because he was worried about bone density. Coughing up mucous. Albuterol does help when he takes it.  Hyperthyroidism:controlled on methimazole. Saw endo at [place] (dr. Koleen Nimrod) but she has moved. Wondering if needing to re-establish with endocrinology. Down to one methimazole and metoprolol 1/2 BID. Has responded well to this.   Bilateral edema lower extremities:better after compression stockings. Puts them on whenever he flares up with swelling. Always wears the compression stockings if long immobility (plane rides).   Hypertension: Doing well with blood pressure. NO problems tolerating medications.   Recurrent DVT ONG:EXBM recent in October 2018; on chronic anticoagulation (eliquis)  Vascular surgery - wore compression stockings for 3 months which has helped with edema.   Palpitations: sees cardiology, Dr. Ellyn Hack. Managed on  metoprolol. Improved control with better thryoid control.   Hyperlipidemia: cardiology deferred to PCP for statin discussion.   Past Medical History:  Diagnosis Date  . ALLERGIC RHINITIS 05/21/2010  . ASTHMA 05/21/2010  . Asthma   . Factor V Leiden ([place])   . HYPERTENSION 05/21/2010   Diffiult to control  . PULMONARY EMBOLISM 05/21/2010   Past Surgical History:  Procedure Laterality Date  . ACHILLES TENDON REPAIR    . DOPPLER ECHOCARDIOGRAPHY  12/30/2011   EF >55%; MILD LVH,MILD LAE,NORMAL DIASTOLIC FUNCTION  . RENAL DOPPLER  12/30/2011   NORMAL RENAL   No Known Allergies Current Meds  Medication Sig  . albuterol (PROVENTIL HFA;VENTOLIN HFA) 108 (90 Base) MCG/ACT inhaler INHALE 2 PUFFS EVERY 4 HOURS AS NEEDED WHEEZING  . amLODipine (NORVASC) 10 MG tablet TAKE 1 TABLET BY MOUTH EVERY DAY (Patient taking differently: TAKE 10 mg TABLET BY MOUTH EVERY DAY)  . apixaban (ELIQUIS) 5 MG TABS tablet Take 1 tablet (5 mg total) by mouth 2 (two) times daily.  . Calcium Carbonate-Vitamin D (CALTRATE 600+D) 600-400 MG-UNIT per tablet Take 1 tablet by mouth daily.    . chlorthalidone (HYGROTON) 50 MG tablet TAKE 1 TABLET BY MOUTH EVERY DAY  . Cholecalciferol (VITAMIN D-3) 5000 units TABS Take 5,000 Units by mouth daily.  . methimazole (TAPAZOLE) 5 MG tablet Take 5 mg by mouth daily.  . montelukast (SINGULAIR) 10 MG tablet Take 10 mg by mouth daily as needed.   . Multiple Vitamin (MULTIVITAMIN) tablet Take 1 tablet by mouth daily.    . Omega-3 Fatty Acids (FISH OIL) 1000 MG CAPS Take by mouth 2 (two) times daily.   Marland Kitchen  ramipril (ALTACE) 10 MG capsule Take 1 capsule (10 mg total) by mouth daily.   Current Facility-Administered Medications for the 08/16/18 encounter (Office Visit) with Caren Macadam, MD  Medication  . albuterol (PROVENTIL) (2.5 MG/3ML) 0.083% nebulizer solution 5 mg  . ipratropium (ATROVENT) nebulizer solution 0.5 mg   Social History   Tobacco Use  . Smoking status: Never  Smoker  . Smokeless tobacco: Never Used  Substance Use Topics  . Alcohol use: Yes    Comment: occasional    Family History  Problem Relation Age of Onset  . Cancer Father   . Kidney failure Father        single kidney; hx of recurrent infections  . Cancer Brother   . Hypertension Mother   . Heart disease Neg Hx      Review of Systems  Constitutional: Negative for chills and fever.  HENT: Negative for congestion, sinus pressure, sinus pain and sore throat.   Respiratory: Positive for cough and shortness of breath. Negative for chest tightness and wheezing (intermittent).   Cardiovascular: Positive for palpitations (rare now). Negative for leg swelling.    Objective:  BP 118/64 (BP Location: Left Arm, Patient Position: Sitting, Cuff Size: Large)   Pulse 61   Temp 98.1 F (36.7 C) (Oral)   Ht 6' 1.5" (1.867 m)   Wt 224 lb 3.2 oz (101.7 kg)   SpO2 98%   BMI 29.18 kg/m   Weight: 224 lb 3.2 oz (101.7 kg)   BP Readings from Last 3 Encounters:  08/16/18 118/64  05/04/18 115/78  04/25/18 110/70   Wt Readings from Last 3 Encounters:  08/16/18 224 lb 3.2 oz (101.7 kg)  05/04/18 234 lb 6.4 oz (106.3 kg)  04/25/18 235 lb (106.6 kg)    Physical Exam  Constitutional: He appears well-developed and well-nourished. No distress.  Cardiovascular: Normal rate, regular rhythm and normal heart sounds. Exam reveals no friction rub.  No murmur heard. Trace bilate lower extremity edema  Pulmonary/Chest: Effort normal and breath sounds normal. No respiratory distress. He has no decreased breath sounds. He has no wheezes. He has no rhonchi. He has no rales.  Last albuterol use about 2.5 hours prior to exam. Good air exchange.   Psychiatric: He has a normal mood and affect.    Assessment/Plan:  1. Essential hypertension Pressure well controlled.  Continue current medications.  He does follow with cardiology and will continue to do so. - metoprolol tartrate (LOPRESSOR) 25 MG tablet;  Take 0.5 tablets (12.5 mg total) by mouth 2 (two) times daily. - Comprehensive metabolic panel; Future - CBC with Differential/Platelet; Future  2. 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  3. Graves disease Referral placed for new endocrinologist since his has moved.  Symptoms are well controlled currently. - Ambulatory referral to Endocrinology - DG Bone Density; Future  4. Palpitations Well-controlled and thought to be secondary to thyroid disease.  5. Hyperlipidemia with target LDL less than 100 We discussed consideration for statin today.  We are planning to recheck blood work in late October and can rediscuss at that time.  He is hesitant to start statin due to potential side effects, specifically muscle cramping and memory impairment.  He is not completely resistant to starting if indicated however. - Lipid panel; Future  6. Need for vaccination for Strep pneumoniae Completed. - Pneumococcal conjugate vaccine 13-valent IM  7. Need for shingles vaccine Ordered for completion at pharmacy. - Varicella-zoster  vaccine IM  8. Low bone density for age Plan to recheck bone density.  He has concerns with loss of bone mass secondary to thyroid and previous steroid use with asthma. - DG Bone Density; Future  9. Mild intermittent asthma with acute exacerbation Typically well controlled with flareups approximately every few years.  We will try a short prednisone burst.  If symptoms have not resolved at the end of this, we could certainly consider a daily steroid inhaler. - Spacer/Aero-Holding Chambers (E-Z SPACER) inhaler; Use as instructed  Dispense: 1 each; Refill: 2 - predniSONE (DELTASONE) 20 MG tablet; Take 2 tablets (40 mg total) by mouth daily with breakfast for 5 days.  Dispense: 10 tablet; Refill: 0  Return pending bloodwork in October-November.  Micheline Rough, MD

## 2018-08-16 NOTE — Patient Instructions (Addendum)
Check with work clinic whether you had Tdap or just Td. If you didn't have Tdap check cost of this with pharmacy/insurance. If you did have Tdap then you are due just for Td.    How to Use a Metered Dose Inhaler A metered dose inhaler is a handheld device for taking medicine that must be breathed into the lungs (inhaled). The device can be used to deliver a variety of inhaled medicines, including:  Quick relief or rescue medicines, such as bronchodilators.  Controller medicines, such as corticosteroids.  The medicine is delivered by pushing down on a metal canister to release a preset amount of spray and medicine. Each device contains the amount of medicine that is needed for a preset number of uses (inhalations). Your health care provider may recommend that you use a spacer with your inhaler to help you take the medicine more effectively. A spacer is a plastic tube with a mouthpiece on one end and an opening that connects to the inhaler on the other end. A spacer holds the medicine in a tube for a short time, which allows you to inhale more medicine. What are the risks? If you do not use your inhaler correctly, medicine might not reach your lungs to help you breathe. Inhaler medicine can cause side effects, such as:  Mouth or throat infection.  Cough.  Hoarseness.  Headache.  Nausea and vomiting.  Lung infection (pneumonia) in people who have a lung condition called COPD.  How to use a metered dose inhaler without a spacer 1. Remove the cap from the inhaler. 2. If you are using the inhaler for the first time, shake it for 5 seconds, turn it away from your face, then release 4 puffs into the air. This is called priming. 3. Shake the inhaler for 5 seconds. 4. Position the inhaler so the top of the canister faces up. 5. Put your index finger on the top of the medicine canister. Support the bottom of the inhaler with your thumb. 6. Breathe out normally and as completely as possible,  away from the inhaler. 7. Either place the inhaler between your teeth and close your lips tightly around the mouthpiece, or hold the inhaler 1-2 inches (2.5-5 cm) away from your open mouth. Keep your tongue down out of the way. If you are unsure which technique to use, ask your health care provider. 8. Press the canister down with your index finger to release the medicine, then inhale deeply and slowly through your mouth (not your nose) until your lungs are completely filled. Inhaling should take 4-6 seconds. 9. Hold the medicine in your lungs for 5-10 seconds (10 seconds is best). This helps the medicine get into the small airways of your lungs. 10. With your lips in a tight circle (pursed), breathe out slowly. 11. Repeat steps 3-10 until you have taken the number of puffs that your health care provider directed. Wait about 1 minute between puffs or as directed. 12. Put the cap on the inhaler. 13. If you are using a steroid inhaler, rinse your mouth with water, gargle, and spit out the water. Do not swallow the water. How to use a metered dose inhaler with a spacer 1. Remove the cap from the inhaler. 2. If you are using the inhaler for the first time, shake it for 5 seconds, turn it away from your face, then release 4 puffs into the air. This is called priming. 3. Shake the inhaler for 5 seconds. 4. Place the open end  of the spacer onto the inhaler mouthpiece. 5. Position the inhaler so the top of the canister faces up and the spacer mouthpiece faces you. 6. Put your index finger on the top of the medicine canister. Support the bottom of the inhaler and the spacer with your thumb. 7. Breathe out normally and as completely as possible, away from the spacer. 8. Place the spacer between your teeth and close your lips tightly around it. Keep your tongue down out of the way. 9. Press the canister down with your index finger to release the medicine, then inhale deeply and slowly through your mouth (not  your nose) until your lungs are completely filled. Inhaling should take 4-6 seconds. 10. Hold the medicine in your lungs for 5-10 seconds (10 seconds is best). This helps the medicine get into the small airways of your lungs. 11. With your lips in a tight circle (pursed), breathe out slowly. 12. Repeat steps 3-11 until you have taken the number of puffs that your health care provider directed. Wait about 1 minute between puffs or as directed. 13. Remove the spacer from the inhaler and put the cap on the inhaler. 14. If you are using a steroid inhaler, rinse your mouth with water, gargle, and spit out the water. Do not swallow the water. Follow these instructions at home:  Take your inhaled medicine only as told by your health care provider. Do not use the inhaler more than directed by your health care provider.  Keep all follow-up visits as told by your health care provider. This is important.  If your inhaler has a counter, you can check it to determine how full your inhaler is. If your inhaler does not have a counter, ask your health care provider when you will need to refill your inhaler and write the refill date on a calendar or on your inhaler canister. Note that you cannot know when an inhaler is empty by shaking it.  Follow directions on the package insert for care and cleaning of your inhaler and spacer. Contact a health care provider if:  Symptoms are only partially relieved with your inhaler.  You are having trouble using your inhaler.  You have an increase in phlegm.  You have headaches. Get help right away if:  You feel little or no relief after using your inhaler.  You have dizziness.  You have a fast heart rate.  You have chills or a fever.  You have night sweats.  There is blood in your phlegm. Summary  A metered dose inhaler is a handheld device for taking medicine that must be breathed into the lungs (inhaled).  The medicine is delivered by pushing down on a  metal canister to release a preset amount of spray and medicine.  Each device contains the amount of medicine that is needed for a preset number of uses (inhalations). This information is not intended to replace advice given to you by your health care provider. Make sure you discuss any questions you have with your health care provider. Document Released: 12/13/2005 Document Revised: 11/02/2016 Document Reviewed: 11/02/2016 Elsevier Interactive Patient Education  2017 [employer].

## 2018-08-29 ENCOUNTER — Other Ambulatory Visit: Payer: Self-pay | Admitting: Internal Medicine

## 2018-09-06 ENCOUNTER — Ambulatory Visit: Payer: Self-pay | Admitting: Internal Medicine

## 2018-09-07 ENCOUNTER — Encounter: Payer: Self-pay | Admitting: Internal Medicine

## 2018-09-21 ENCOUNTER — Other Ambulatory Visit: Payer: Self-pay | Admitting: Cardiology

## 2018-09-22 ENCOUNTER — Other Ambulatory Visit: Payer: Self-pay | Admitting: *Deleted

## 2018-09-22 MED ORDER — ALBUTEROL SULFATE HFA 108 (90 BASE) MCG/ACT IN AERS: INHALATION_SPRAY | RESPIRATORY_TRACT | 2 refills | Status: DC

## 2018-10-05 ENCOUNTER — Encounter: Payer: Self-pay | Admitting: Endocrinology

## 2018-10-05 ENCOUNTER — Ambulatory Visit (INDEPENDENT_AMBULATORY_CARE_PROVIDER_SITE_OTHER): Payer: Medicare HMO | Admitting: Endocrinology

## 2018-10-05 VITALS — BP 128/72 | HR 61 | Ht 73.5 in | Wt 222.4 lb

## 2018-10-05 DIAGNOSIS — E0590 Thyrotoxicosis, unspecified without thyrotoxic crisis or storm: Secondary | ICD-10-CM | POA: Diagnosis not present

## 2018-10-05 DIAGNOSIS — Z23 Encounter for immunization: Secondary | ICD-10-CM | POA: Diagnosis not present

## 2018-10-05 DIAGNOSIS — E0500 Thyrotoxicosis with diffuse goiter without thyrotoxic crisis or storm: Secondary | ICD-10-CM

## 2018-10-05 LAB — T4, FREE: Free T4: 0.72 ng/dL (ref 0.60–1.60)

## 2018-10-05 LAB — TSH: TSH: 2.79 u[IU]/mL (ref 0.35–4.50)

## 2018-10-05 LAB — T3, FREE: T3 FREE: 3.4 pg/mL (ref 2.3–4.2)

## 2018-10-05 NOTE — Patient Instructions (Addendum)
blood tests are requested for you today.  We'll let you know about the results. If this blood test is normal, you no longer need the metoprolol for the thyroid.  If ever you have fever while taking methimazole, stop it and call us, even if the reason is obvious, because of the risk of a rare side-effect. Please come back for a follow-up appointment in 6 months      Hyperthyroidism Hyperthyroidism is when the thyroid is too active (overactive). Your thyroid is a large gland that is located in your neck. The thyroid helps to control how your body uses food (metabolism). When your thyroid is overactive, it produces too much of a hormone called thyroxine. What are the causes? Causes of hyperthyroidism may include:  Graves disease. This is when your immune system attacks the thyroid gland. This is the most common cause.  Inflammation of the thyroid gland.  Tumor in the thyroid gland or somewhere else.  Excessive use of thyroid medicines, including: ? Prescription thyroid supplement. ? Herbal supplements that mimic thyroid hormones.  Solid or fluid-filled lumps within your thyroid gland (thyroid nodules).  Excessive ingestion of iodine.  What increases the risk?  Being male.  Having a family history of thyroid conditions. What are the signs or symptoms? Signs and symptoms of hyperthyroidism may include:  Nervousness.  Inability to tolerate heat.  Unexplained weight loss.  Diarrhea.  Change in the texture of hair or skin.  Heart skipping beats or making extra beats.  Rapid heart rate.  Loss of menstruation.  Shaky hands.  Fatigue.  Restlessness.  Increased appetite.  Sleep problems.  Enlarged thyroid gland or nodules.  How is this diagnosed? Diagnosis of hyperthyroidism may include:  Medical history and physical exam.  Blood tests.  Ultrasound tests.  How is this treated? Treatment may include:  Medicines to control your thyroid.  Surgery to  remove your thyroid.  Radiation therapy.  Follow these instructions at home:  Take medicines only as directed by your health care provider.  Do not use any tobacco products, including cigarettes, chewing tobacco, or electronic cigarettes. If you need help quitting, ask your health care provider.  Do not exercise or do physical activity until your health care provider approves.  Keep all follow-up appointments as directed by your health care provider. This is important. Contact a health care provider if:  Your symptoms do not get better with treatment.  You have fever.  You are taking thyroid replacement medicine and you: ? Have depression. ? Feel mentally and physically slow. ? Have weight gain. Get help right away if:  You have decreased alertness or a change in your awareness.  You have abdominal pain.  You feel dizzy.  You have a rapid heartbeat.  You have an irregular heartbeat. This information is not intended to replace advice given to you by your health care provider. Make sure you discuss any questions you have with your health care provider. Document Released: 12/13/2005 Document Revised: 05/13/2016 Document Reviewed: 04/30/2014 Elsevier Interactive Patient Education  2018 [employer].

## 2018-10-05 NOTE — Progress Notes (Signed)
Subjective:    Patient ID: Matthew Trujillo, male    DOB: 1951/05/09, 67 y.o.   MRN: [id number]  HPI Pt is referred by Dr Burnice Logan, for hyperthyroidism.  Pt reports he was dx'ed with hyperthyroidism in 2017.  He was rx'ed tapazole.  He has never had XRT to the anterior neck, or thyroid surgery.  He does not consume kelp or any non-prescribed thyroid medication.  He has never been on amiodarone.  He has slight tremor of the hands, but no assoc fever.   Past Medical History:  Diagnosis Date  . ALLERGIC RHINITIS 05/21/2010  . ASTHMA 05/21/2010  . 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 HYPERTENSION 05/21/2010   Diffiult to control  . 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 ACHILLES TENDON REPAIR    . DOPPLER ECHOCARDIOGRAPHY  12/30/2011   EF >55%; MILD LVH,MILD LAE,NORMAL DIASTOLIC FUNCTION  . RENAL DOPPLER  12/30/2011   NORMAL RENAL    Social History   Socioeconomic History  . Marital status: Married    Spouse name: Not on file  . Number of children: Not on file  . Years of education: Not on file  . Highest education level: Not on file  Occupational History  . Not on file  Social Needs  . Financial resource strain: Not on file  . Food insecurity:    Worry: Not on file    Inability: Not on file  . Transportation needs:    Medical: Not on file    Non-medical: Not on file  Tobacco Use  . Smoking status: Never Smoker  . Smokeless tobacco: Never Used  Substance and Sexual Activity  . Alcohol use: Yes    Comment: occasional   . Drug use: No  . Sexual activity: Not on file  Lifestyle  . Physical activity:    Days per week: Not on file    Minutes per session: Not on file  . Stress: Not on file  Relationships  . Social connections:    Talks on phone: Not on file    Gets together: Not on file    Attends religious service: Not on file    Active member of club or organization: Not on file    Attends meetings of clubs or organizations:  Not on file    Relationship status: Not on file  . Intimate partner violence:    Fear of current or ex partner: Not on file    Emotionally abused: Not on file    Physically abused: Not on file    Forced sexual activity: Not on file  Other Topics Concern  . Not on file  Social History Narrative   He is a married father of 2. He does exercise about 3-4 days a week with no true symptoms. Does not smoke and occasional social alcohol.     Current Outpatient Medications on File Prior to Visit  Medication Sig Dispense Refill  . albuterol (PROVENTIL HFA;VENTOLIN HFA) 108 (90 Base) MCG/ACT inhaler INHALE 2 PUFFS EVERY 4 HOURS AS NEEDED WHEEZING 8.5 Inhaler 2  . amLODipine (NORVASC) 10 MG tablet TAKE 10 mg TABLET BY MOUTH EVERY DAY 90 tablet 3  . apixaban (ELIQUIS) 5 MG TABS tablet Take 1 tablet (5 mg total) by mouth 2 (two) times daily. 180 tablet 3  . Calcium Carbonate-Vitamin D (CALTRATE 600+D) 600-400 MG-UNIT per tablet Take 1 tablet by mouth 2 (two) times daily.     . chlorthalidone (HYGROTON)  50 MG tablet TAKE 1 TABLET BY MOUTH EVERY DAY 90 tablet 3  . Cholecalciferol (VITAMIN D-3) 5000 units TABS Take 5,000 Units by mouth daily.    . methimazole (TAPAZOLE) 5 MG tablet Take 5 mg by mouth daily.    . metoprolol tartrate (LOPRESSOR) 25 MG tablet Take 0.5 tablets (12.5 mg total) by mouth 2 (two) times daily.    . montelukast (SINGULAIR) 10 MG tablet Take 10 mg by mouth daily as needed.     . Multiple Vitamin (MULTIVITAMIN) tablet Take 1 tablet by mouth daily.      . Omega-3 Fatty Acids (FISH OIL) 1000 MG CAPS Take by mouth 2 (two) times daily.     . ramipril (ALTACE) 10 MG capsule Take 1 capsule (10 mg total) by mouth daily. 90 capsule 3  . Spacer/Aero-Holding Chambers (E-Z SPACER) inhaler Use as instructed 1 each 2   Current Facility-Administered Medications on File Prior to Visit  Medication Dose Route Frequency Provider Last Rate Last Dose  . albuterol (PROVENTIL) (2.5 MG/3ML) 0.083%  nebulizer solution 5 mg  5 mg Nebulization Once Roselee Culver, MD      . ipratropium (ATROVENT) nebulizer solution 0.5 mg  0.5 mg Nebulization Once Roselee Culver, MD        No Known Allergies  Family History  Problem Relation Age of Onset  . Cancer Father   . Kidney failure Father        single kidney; hx of recurrent infections  . Cancer Brother   . Hypertension Mother   . Hypothyroidism Mother   . Hypothyroidism Sister   . Heart disease Neg Hx     BP 128/72 (BP Location: Left Arm)   Pulse 61   Ht 6' 1.5" (1.867 m)   Wt 222 lb 6.4 oz (100.9 kg)   SpO2 96%   BMI 28.94 kg/m     Review of Systems denies weight loss, headache, hoarseness, diplopia, palpitations, sob, diarrhea, polyuria, muscle weakness, edema, excessive diaphoresis, anxiety, heat intolerance, easy bruising, and rhinorrhea.      Objective:   Physical Exam VS: see vs page GEN: no distress HEAD: head: no deformity eyes: no periorbital swelling, no proptosis external nose and ears are normal mouth: no lesion seen NECK: supple, thyroid is not enlarged CHEST WALL: no deformity LUNGS: clear to auscultation CV: reg rate and rhythm, no murmur ABD: abdomen is soft, nontender.  no hepatosplenomegaly.  not distended.  no hernia MUSCULOSKELETAL: muscle bulk and strength are grossly normal.  no obvious joint swelling.  gait is normal and steady EXTEMITIES: no deformity.  Trace bilat leg edema PULSES: no carotid bruit NEURO:  cn 2-12 grossly intact.   readily moves all 4's.  sensation is intact to touch on all 4's.  No tremor.   SKIN:  Normal texture and temperature.  No rash or suspicious lesion is visible.  Not diaphoretic NODES:  None palpable at the neck PSYCH: alert, well-oriented.  Does not appear anxious nor depressed.  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  Lab Results  Component Value Date   TSH 3.47  04/25/2018   I have reviewed outside records, and summarized: Pt was noted to have goiter, and referred here.  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  new to me, due for recheck   Patient Instructions  blood tests are requested for you today.  We'll let you know about the results. If this blood test is normal, you no longer need the metoprolol for the thyroid.  If ever you have fever while taking methimazole, stop it and call us, even if the reason is obvious, because of the risk of a rare side-effect. Please come back for a follow-up appointment in 6 months      Hyperthyroidism Hyperthyroidism is when the thyroid is too active (overactive). Your thyroid is a large gland that is located in your neck. The thyroid helps to control how your body uses food (metabolism). When your thyroid is overactive, it produces too much of a hormone called thyroxine. What are the causes? Causes of hyperthyroidism may include:  Graves disease. This is when your immune system attacks the thyroid gland. This is the most common cause.  Inflammation of the thyroid gland.  Tumor in the thyroid gland or somewhere else.  Excessive use of thyroid medicines, including: ? Prescription thyroid supplement. ? Herbal supplements that mimic thyroid hormones.  Solid or fluid-filled lumps within your thyroid gland (thyroid nodules).  Excessive ingestion of iodine.  What increases the risk?  Being male.  Having a family history of thyroid conditions. What are the signs or symptoms? Signs and symptoms of hyperthyroidism may include:  Nervousness.  Inability to tolerate heat.  Unexplained weight loss.  Diarrhea.  Change in the texture of hair or skin.  Heart skipping beats or making extra beats.  Rapid heart rate.  Loss of menstruation.  Shaky hands.  Fatigue.  Restlessness.  Increased appetite.  Sleep  problems.  Enlarged thyroid gland or nodules.  How is this diagnosed? Diagnosis of hyperthyroidism may include:  Medical history and physical exam.  Blood tests.  Ultrasound tests.  How is this treated? Treatment may include:  Medicines to control your thyroid.  Surgery to remove your thyroid.  Radiation therapy.  Follow these instructions at home:  Take medicines only as directed by your health care provider.  Do not use any tobacco products, including cigarettes, chewing tobacco, or electronic cigarettes. If you need help quitting, ask your health care provider.  Do not exercise or do physical activity until your health care provider approves.  Keep all follow-up appointments as directed by your health care provider. This is important. Contact a health care provider if:  Your symptoms do not get better with treatment.  You have fever.  You are taking thyroid replacement medicine and you: ? Have depression. ? Feel mentally and physically slow. ? Have weight gain. Get help right away if:  You have decreased alertness or a change in your awareness.  You have abdominal pain.  You feel dizzy.  You have a rapid heartbeat.  You have an irregular heartbeat. This information is not intended to replace advice given to you by your health care provider. Make sure you discuss any questions you have with your health care provider. Document Released: 12/13/2005 Document Revised: 05/13/2016 Document Reviewed: 04/30/2014 Elsevier Interactive Patient Education  2018 [employer].

## 2018-10-09 DIAGNOSIS — M8580 Other specified disorders of bone density and structure, unspecified site: Secondary | ICD-10-CM | POA: Diagnosis not present

## 2018-10-09 DIAGNOSIS — Z8249 Family history of ischemic heart disease and other diseases of the circulatory system: Secondary | ICD-10-CM | POA: Diagnosis not present

## 2018-10-09 DIAGNOSIS — Z823 Family history of stroke: Secondary | ICD-10-CM | POA: Diagnosis not present

## 2018-10-09 DIAGNOSIS — I10 Essential (primary) hypertension: Secondary | ICD-10-CM | POA: Diagnosis not present

## 2018-10-09 DIAGNOSIS — R69 Illness, unspecified: Secondary | ICD-10-CM | POA: Diagnosis not present

## 2018-10-09 DIAGNOSIS — J45909 Unspecified asthma, uncomplicated: Secondary | ICD-10-CM | POA: Diagnosis not present

## 2018-10-09 DIAGNOSIS — Z7901 Long term (current) use of anticoagulants: Secondary | ICD-10-CM | POA: Diagnosis not present

## 2018-10-09 DIAGNOSIS — Z809 Family history of malignant neoplasm, unspecified: Secondary | ICD-10-CM | POA: Diagnosis not present

## 2018-10-09 DIAGNOSIS — E0590 Thyrotoxicosis, unspecified without thyrotoxic crisis or storm: Secondary | ICD-10-CM | POA: Diagnosis not present

## 2018-10-09 DIAGNOSIS — Z86718 Personal history of other venous thrombosis and embolism: Secondary | ICD-10-CM | POA: Diagnosis not present

## 2018-10-12 ENCOUNTER — Ambulatory Visit: Payer: Self-pay | Admitting: Internal Medicine

## 2018-10-12 ENCOUNTER — Other Ambulatory Visit: Payer: Self-pay | Admitting: Internal Medicine

## 2018-10-12 DIAGNOSIS — R69 Illness, unspecified: Secondary | ICD-10-CM | POA: Diagnosis not present

## 2018-10-17 ENCOUNTER — Encounter: Payer: Self-pay | Admitting: Internal Medicine

## 2018-11-14 ENCOUNTER — Encounter: Payer: Self-pay | Admitting: Family Medicine

## 2018-11-14 ENCOUNTER — Ambulatory Visit (INDEPENDENT_AMBULATORY_CARE_PROVIDER_SITE_OTHER): Payer: Medicare HMO | Admitting: Family Medicine

## 2018-11-14 VITALS — BP 126/80 | HR 85 | Temp 97.9°F | Resp 12 | Ht 73.5 in | Wt 224.4 lb

## 2018-11-14 DIAGNOSIS — J988 Other specified respiratory disorders: Secondary | ICD-10-CM | POA: Diagnosis not present

## 2018-11-14 DIAGNOSIS — J309 Allergic rhinitis, unspecified: Secondary | ICD-10-CM

## 2018-11-14 DIAGNOSIS — J4520 Mild intermittent asthma, uncomplicated: Secondary | ICD-10-CM

## 2018-11-14 MED ORDER — FLUTICASONE PROPIONATE 50 MCG/ACT NA SUSP: 1.0000 | Freq: Two times a day (BID) | NASAL | 1 refills | Status: DC

## 2018-11-14 MED ORDER — DOXYCYCLINE HYCLATE 100 MG PO TABS: 100.0000 mg | ORAL_TABLET | Freq: Two times a day (BID) | ORAL | 0 refills | Status: AC

## 2018-11-14 MED ORDER — PREDNISONE 20 MG PO TABS: 40.0000 mg | ORAL_TABLET | Freq: Every day | ORAL | 0 refills | Status: AC

## 2018-11-14 MED ORDER — BENZONATATE 100 MG PO CAPS: 200.0000 mg | ORAL_CAPSULE | Freq: Two times a day (BID) | ORAL | 0 refills | Status: AC | PRN

## 2018-11-14 NOTE — Progress Notes (Signed)
ACUTE VISIT  HPI:  Chief Complaint  Patient presents with  . Sinusitis    sx started 9 days ago  . Cough    with dark and yellow phlem  . Nasal Congestion    [name] is a 67 y.o.male here today complaining of 9 days of respiratory symptoms. Productive cough with brownish sputum,it seems to be worse in the morning. In the afternoon sputum is yellowish. Denies dyspnea or hemoptysis.  Chest tightness and wheezing. Hx of asthma. Used Albuterol inh this am. He is also on Singulair 10 mg daily.  Nasal congestion,rhinorrhea,and post nasal drainage.  Denies fever,chils,or body aches.  He thinks he has a "sinus infection." Mild frontal pressure sensation,alleviated by blowing nose in the morning.   Cough  This is a new problem. The current episode started 1 to 4 weeks ago. The problem has been gradually worsening. The cough is productive of sputum. Associated symptoms include headaches, nasal congestion, postnasal drip, rhinorrhea, shortness of breath and wheezing. Pertinent negatives include no chest pain, chills, ear congestion, ear pain, eye redness, fever, heartburn, hemoptysis, myalgias, rash, sore throat or weight loss. The symptoms are aggravated by exercise. He has tried a beta-agonist inhaler, OTC cough suppressant and leukotriene antagonists for the symptoms. The treatment provided mild relief. His past medical history is significant for asthma and environmental allergies.   No Hx of overseas recent travel. No sick contact. No known insect bite.  Hx of allergies: Yes. Allergic rhinitis and asthma.   OTC medications for this problem: Saline,sudfed,mucinex,and moist mist.  Review of Systems  Constitutional: Positive for fatigue. Negative for activity change, appetite change, chills, fever and weight loss.  HENT: Positive for congestion, postnasal drip, rhinorrhea and sinus pressure. Negative for ear pain, mouth sores, sore throat, trouble swallowing  and voice change.   Eyes: Negative for discharge, redness and itching.  Respiratory: Positive for cough, chest tightness, shortness of breath and wheezing. Negative for hemoptysis.   Cardiovascular: Negative for chest pain and leg swelling.  Gastrointestinal: Negative for abdominal pain, diarrhea, heartburn, nausea and vomiting.  Musculoskeletal: Negative for gait problem and myalgias.  Skin: Negative for rash.  Allergic/Immunologic: Positive for environmental allergies.  Neurological: Positive for headaches. Negative for syncope and weakness.      Current Outpatient Medications on File Prior to Visit  Medication Sig Dispense Refill  . albuterol (PROVENTIL HFA;VENTOLIN HFA) 108 (90 Base) MCG/ACT inhaler INHALE 2 PUFFS EVERY 4 HOURS AS NEEDED WHEEZING 8.5 Inhaler 2  . amLODipine (NORVASC) 10 MG tablet TAKE 10 mg TABLET BY MOUTH EVERY DAY 90 tablet 3  . Calcium Carbonate-Vitamin D (CALTRATE 600+D) 600-400 MG-UNIT per tablet Take 1 tablet by mouth 2 (two) times daily.     . chlorthalidone (HYGROTON) 50 MG tablet TAKE 1 TABLET BY MOUTH EVERY DAY 90 tablet 3  . Cholecalciferol (VITAMIN D-3) 5000 units TABS Take 5,000 Units by mouth daily.    Marland Kitchen ELIQUIS 5 MG TABS tablet TAKE 1 TABLET BY MOUTH TWICE A DAY 180 tablet 3  . methimazole (TAPAZOLE) 5 MG tablet Take 5 mg by mouth daily.    . montelukast (SINGULAIR) 10 MG tablet Take 10 mg by mouth daily as needed.     . Multiple Vitamin (MULTIVITAMIN) tablet Take 1 tablet by mouth daily.      . Omega-3 Fatty Acids (FISH OIL) 1000 MG CAPS Take by mouth 2 (two) times daily.     . ramipril (ALTACE) 10 MG capsule Take  1 capsule (10 mg total) by mouth daily. 90 capsule 3  . Spacer/Aero-Holding Chambers (E-Z SPACER) inhaler Use as instructed 1 each 2   Current Facility-Administered Medications on File Prior to Visit  Medication Dose Route Frequency Provider Last Rate Last Dose  . albuterol (PROVENTIL) (2.5 MG/3ML) 0.083% nebulizer solution 5 mg  5 mg  Nebulization Once Roselee Culver, MD      . ipratropium (ATROVENT) nebulizer solution 0.5 mg  0.5 mg Nebulization Once Roselee Culver, MD         Past Medical History:  Diagnosis Date  . ALLERGIC RHINITIS 05/21/2010  . ASTHMA 05/21/2010  . Factor V Leiden ([place])   . HYPERTENSION 05/21/2010   Diffiult to control  . PULMONARY EMBOLISM 05/21/2010   No Known Allergies  Social History   Socioeconomic History  . Marital status: Married    Spouse name: Not on file  . Number of children: Not on file  . Years of education: Not on file  . Highest education level: Not on file  Occupational History  . Not on file  Social Needs  . Financial resource strain: Not on file  . Food insecurity:    Worry: Not on file    Inability: Not on file  . Transportation needs:    Medical: Not on file    Non-medical: Not on file  Tobacco Use  . Smoking status: Never Smoker  . Smokeless tobacco: Never Used  Substance and Sexual Activity  . Alcohol use: Yes    Comment: occasional   . Drug use: No  . Sexual activity: Not on file  Lifestyle  . Physical activity:    Days per week: Not on file    Minutes per session: Not on file  . Stress: Not on file  Relationships  . Social connections:    Talks on phone: Not on file    Gets together: Not on file    Attends religious service: Not on file    Active member of club or organization: Not on file    Attends meetings of clubs or organizations: Not on file    Relationship status: Not on file  Other Topics Concern  . Not on file  Social History Narrative   He is a married father of 2. He does exercise about 3-4 days a week with no true symptoms. Does not smoke and occasional social alcohol.     Vitals:   11/14/18 1018  BP: 126/80  Pulse: 85  Resp: 12  Temp: 97.9 F (36.6 C)  SpO2: 99%   Body mass index is 29.2 kg/m.   Physical Exam  Nursing note and vitals reviewed. Constitutional: He is oriented to person, place, and time. He  appears well-developed. He does not appear ill. No distress.  HENT:  Head: Atraumatic.  Right Ear: Tympanic membrane, external ear and ear canal normal.  Left Ear: Tympanic membrane, external ear and ear canal normal.  Nose: Right sinus exhibits no maxillary sinus tenderness. Left sinus exhibits no maxillary sinus tenderness.  Mouth/Throat: Oropharynx is clear and moist and mucous membranes are normal.  Mild frontal pressure with palpation. Normal sinus transillumination. Postnasal drainage. Hypertrophic turbinates.  Eyes: Conjunctivae and EOM are normal.  Cardiovascular: Normal rate and regular rhythm.  No murmur heard. Respiratory: Effort normal and breath sounds normal. No stridor. No respiratory distress.  Lymphadenopathy:       Head (right side): No submandibular adenopathy present.       Head (left side): No  submandibular adenopathy present.    He has no cervical adenopathy.  Neurological: He is alert and oriented to person, place, and time. He has normal strength.  Skin: Skin is warm. No rash noted. No erythema.  Psychiatric: He has a normal mood and affect.  Well groomed, good eye contact.      ASSESSMENT AND PLAN:  Mr. Peace was seen today for sinusitis, cough and nasal congestion.  Diagnoses and all orders for this visit:  Asthma in adult, mild intermittent, uncomplicated  Lung auscultation negative for wheezing. After discussion of some side effects he agrees with short course of Prednisone. Albuterol inh 2 puff every 6 hours for a week then as needed for wheezing or shortness of breath.  Instructed about warning signs. F/U with pcp as needed.  -     predniSONE (DELTASONE) 20 MG tablet; Take 2 tablets (40 mg total) by mouth daily with breakfast for 3 days.  Allergic rhinitis, unspecified seasonality, unspecified trigger Some of his symptoms could be related to this problem. Continue saline irrigations but more frequent. No changes in Singulair. Prednisone  may help.  Intranasal steroid,Flonase , daily for a couple of weeks then prb.   -     fluticasone (FLONASE) 50 MCG/ACT nasal spray; Place 1 spray into both nostrils 2 (two) times daily. -     predniSONE (DELTASONE) 20 MG tablet; Take 2 tablets (40 mg total) by mouth daily with breakfast for 3 days.  Respiratory tract infection Lung auscultation negative for rales or rhonchi. Because persistent symptoms + worsening abx was sent to his pharmacy. Recommended holding on sbx for 2-3 days and start if not any better or if symptoms get worse. Instructed about warning signs.  -     doxycycline (VIBRA-TABS) 100 MG tablet; Take 1 tablet (100 mg total) by mouth 2 (two) times daily for 7 days. -     benzonatate (TESSALON) 100 MG capsule; Take 2 capsules (200 mg total) by mouth 2 (two) times daily as needed for up to 10 days.    -Mr. [name] advised to seek attention immediately if symptoms worsen.     Betty G. Martinique, MD  [facility]. [place] office.

## 2018-11-14 NOTE — Patient Instructions (Signed)
A few things to remember from today's visit:   Allergic rhinitis, unspecified seasonality, unspecified trigger - Plan: fluticasone (FLONASE) 50 MCG/ACT nasal spray, predniSONE (DELTASONE) 20 MG tablet  Asthma in adult, mild intermittent, uncomplicated - Plan: predniSONE (DELTASONE) 20 MG tablet  Respiratory tract infection - Plan: doxycycline (VIBRA-TABS) 100 MG tablet, benzonatate (TESSALON) 100 MG capsule  viral infections are self-limited and we treat each symptom depending of severity.  Over the counter medications as decongestants and cold medications usually help, they need to be taken with caution if there is a history of high blood pressure or palpitations. Tylenol and/or Ibuprofen also helps with most symptoms (headache, muscle aching, fever,etc) Plenty of fluids. Honey helps with cough. Steam inhalations helps with runny nose, nasal congestion, and may prevent sinus infections. Cough and nasal congestion could last a few days and sometimes weeks. Please follow in not any better in 1-2 weeks or if symptoms get worse.  Albuterol inh 2 puff every 6 hours for a week then as needed for wheezing or shortness of breath.   Please be sure medication list is accurate. If a new problem present, please set up appointment sooner than planned today.

## 2018-11-15 ENCOUNTER — Ambulatory Visit: Payer: Medicare HMO | Admitting: Internal Medicine

## 2018-11-15 ENCOUNTER — Encounter: Payer: Self-pay | Admitting: Internal Medicine

## 2018-11-15 VITALS — BP 120/76 | HR 72 | Ht 73.5 in | Wt 224.5 lb

## 2018-11-15 DIAGNOSIS — Z86010 Personal history of colonic polyps: Secondary | ICD-10-CM

## 2018-11-15 DIAGNOSIS — Z7901 Long term (current) use of anticoagulants: Secondary | ICD-10-CM | POA: Diagnosis not present

## 2018-11-15 MED ORDER — NA SULFATE-K SULFATE-MG SULF 17.5-3.13-1.6 GM/177ML PO SOLN: 1.0000 | Freq: Once | ORAL | 0 refills | Status: AC

## 2018-11-15 NOTE — Progress Notes (Signed)
HISTORY OF PRESENT ILLNESS:  Matthew Trujillo is a 67 y.o. male with hypertension, asthma, and factor V Leiden with 2 episodes of deep vein thrombosis of the lower extremity 7 years apart.  Last episode October 2018.  He is now maintained on Eliquis.  He presents today regarding surveillance colonoscopy.  Patient last underwent complete colonoscopy January 2012.  He was found to have a diminutive cecal adenoma which was removed.  Otherwise normal exam.  He has not been seen since.  Review of outside blood work from April 2019 finds normal hemoglobin of 15.3.  Normal renal function.  His GI review of systems is negative.  No family history of colon cancer.  REVIEW OF SYSTEMS:  All non-GI ROS negative unless otherwise stated in the HPI except for sinus and allergies  Past Medical History:  Diagnosis Date  . ALLERGIC RHINITIS 05/21/2010  . ASTHMA 05/21/2010  . Factor V Leiden ([place])   . HYPERTENSION 05/21/2010   Diffiult to control  . 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 ACHILLES TENDON REPAIR    . DOPPLER ECHOCARDIOGRAPHY  12/30/2011   EF >55%; MILD LVH,MILD LAE,NORMAL DIASTOLIC FUNCTION  . RENAL DOPPLER  12/30/2011   NORMAL RENAL    Social History Matthew Trujillo  reports that he has never smoked. He has never used smokeless tobacco. He reports that he drinks alcohol. He reports that he does not use drugs.  family history includes Cancer in his brother and father; Hypertension in his mother; Hypothyroidism in his mother and sister; Kidney failure in his father.  No Known Allergies     PHYSICAL EXAMINATION: Vital signs: BP 120/76   Pulse 72   Ht 6' 1.5" (1.867 m)   Wt 224 lb 8 oz (101.8 kg)   BMI 29.22 kg/m   Constitutional: generally well-appearing, no acute distress Psychiatric: alert and oriented x3, cooperative Eyes: extraocular movements intact, anicteric, conjunctiva pink Mouth: oral pharynx moist, no lesions Neck: supple  no lymphadenopathy Cardiovascular: heart regular rate and rhythm, no murmur Lungs: clear to auscultation bilaterally Abdomen: soft, nontender, nondistended, no obvious ascites, no peritoneal signs, normal bowel sounds, no organomegaly Rectal: Deferred until colonoscopy Extremities: no clubbing, cyanosis, or lower extremity edema bilaterally Skin: no lesions on visible extremities Neuro: No focal deficits.  Cranial nerves intact  ASSESSMENT:  1.  History of adenomatous colon polyp 2012.  Overdue for follow-up 2.  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  Colonoscopy.  The patient is high risk due to the need to address his anticoagulation.  We discussed several strategies including continued on the medication, short-term interruption of the medication, or Lovenox bridge.  The risks benefits and alternatives of each strategy reviewed.  Patient prefers coming off Eliquis the day prior to the procedure with anticipation of resuming the drug post procedure.The nature of the procedure, as well as the risks, benefits, and alternatives were carefully and thoroughly reviewed with the patient. Ample time for discussion and questions allowed. The patient understood, was satisfied, and agreed to proceed.

## 2018-11-15 NOTE — Patient Instructions (Signed)

## 2018-11-20 ENCOUNTER — Ambulatory Visit (AMBULATORY_SURGERY_CENTER): Payer: Medicare HMO | Admitting: Internal Medicine

## 2018-11-20 ENCOUNTER — Encounter: Payer: Self-pay | Admitting: Internal Medicine

## 2018-11-20 VITALS — BP 122/89 | HR 68 | Temp 98.4°F | Resp 9 | Ht 73.5 in | Wt 224.0 lb

## 2018-11-20 DIAGNOSIS — Z86010 Personal history of colonic polyps: Secondary | ICD-10-CM | POA: Diagnosis not present

## 2018-11-20 DIAGNOSIS — I10 Essential (primary) hypertension: Secondary | ICD-10-CM | POA: Diagnosis not present

## 2018-11-20 DIAGNOSIS — J45909 Unspecified asthma, uncomplicated: Secondary | ICD-10-CM | POA: Diagnosis not present

## 2018-11-20 DIAGNOSIS — D124 Benign neoplasm of descending colon: Secondary | ICD-10-CM

## 2018-11-20 DIAGNOSIS — E669 Obesity, unspecified: Secondary | ICD-10-CM | POA: Diagnosis not present

## 2018-11-20 MED ORDER — SODIUM CHLORIDE 0.9 % IV SOLN: 500.0000 mL | Freq: Once | INTRAVENOUS | Status: DC

## 2018-11-20 NOTE — Progress Notes (Signed)
Called to room to assist during endoscopic procedure.  Patient ID and intended procedure confirmed with present staff. Received instructions for my participation in the procedure from the performing physician.  

## 2018-11-20 NOTE — Progress Notes (Signed)
Report to PACU, RN, vss, BBS= Clear.  

## 2018-11-20 NOTE — Op Note (Signed)
Matthew Trujillo Patient Name: Matthew Trujillo Procedure Date: 11/20/2018 9:57 AM MRN: [id number] Endoscopist: Docia Chuck. Matthew Trujillo , MD Age: 67 Referring MD:  Date of Birth: [birth year] Gender: Male Account #: [id number] Procedure:                Colonoscopy with cold snare polypectomy x 1 Indications:              High risk colon cancer surveillance: Personal                            history of non-advanced adenoma. Previous exam 2012 Medicines:                Monitored Anesthesia Care Procedure:                Pre-Anesthesia Assessment:                           - Prior to the procedure, a History and Physical                            was performed, and patient medications and                            allergies were reviewed. The patient's tolerance of                            previous anesthesia was also reviewed. The risks                            and benefits of the procedure and the sedation                            options and risks were discussed with the patient.                            All questions were answered, and informed consent                            was obtained. Prior Anticoagulants: The patient has                            taken Eliquis (apixaban), last dose was 2 days                            prior to procedure. ASA Grade Assessment: III - A                            patient with severe systemic disease. After                            reviewing the risks and benefits, the patient was                            deemed in satisfactory condition to undergo the  procedure.                           After obtaining informed consent, the colonoscope                            was passed under direct vision. Throughout the                            procedure, the patient's blood pressure, pulse, and                            oxygen saturations were monitored continuously. The                            Colonoscope was  introduced through the anus and                            advanced to the the cecum, identified by                            appendiceal orifice and ileocecal valve. The                            ileocecal valve, appendiceal orifice, and rectum                            were photographed. The quality of the bowel                            preparation was excellent. The colonoscopy was                            performed without difficulty. The patient tolerated                            the procedure well. The bowel preparation used was                            SUPREP. Scope In: 10:03:23 AM Scope Out: 10:18:59 AM Scope Withdrawal Time: 0 hours 11 minutes 20 seconds  Total Procedure Duration: 0 hours 15 minutes 36 seconds  Findings:                 A 4 mm polyp was found in the descending colon. The                            polyp was removed with a cold snare. Resection and                            retrieval were complete.                           The exam was otherwise without abnormality on  direct and retroflexion views. Complications:            No immediate complications. Estimated blood loss:                            None. Estimated Blood Loss:     Estimated blood loss: none. Impression:               - One 4 mm polyp in the descending colon, removed                            with a cold snare. Resected and retrieved.                           - The examination was otherwise normal on direct                            and retroflexion views. Recommendation:           - Repeat colonoscopy in 5 years for surveillance.                           - Resume Eliquis (apixaban) today at prior dose.                           - Patient has a contact number available for                            emergencies. The signs and symptoms of potential                            delayed complications were discussed with the                             patient. Return to normal activities tomorrow.                            Written discharge instructions were provided to the                            patient.                           - Resume previous diet.                           - Continue present medications.                           - Await pathology results. Docia Chuck. Matthew Pastor, MD 11/20/2018 10:25:40 AM This report has been signed electronically.

## 2018-11-20 NOTE — Patient Instructions (Signed)
YOU HAD AN ENDOSCOPIC PROCEDURE TODAY AT [facility]:   Refer to the procedure report that was given to you for any specific questions about what was found during the examination.  If the procedure report does not answer your questions, please call your gastroenterologist to clarify.  If you requested that your care partner not be given the details of your procedure findings, then the procedure report has been included in a sealed envelope for you to review at your convenience later.  YOU SHOULD EXPECT: Some feelings of bloating in the abdomen. Passage of more gas than usual.  Walking can help get rid of the air that was put into your GI tract during the procedure and reduce the bloating. If you had a lower endoscopy (such as a colonoscopy or flexible sigmoidoscopy) you may notice spotting of blood in your stool or on the toilet paper. If you underwent a bowel prep for your procedure, you may not have a normal bowel movement for a few days.  Please Note:  You might notice some irritation and congestion in your nose or some drainage.  This is from the oxygen used during your procedure.  There is no need for concern and it should clear up in a day or so.  SYMPTOMS TO REPORT IMMEDIATELY:   Following lower endoscopy (colonoscopy or flexible sigmoidoscopy):  Excessive amounts of blood in the stool  Significant tenderness or worsening of abdominal pains  Swelling of the abdomen that is new, acute  Fever of 100F or higher  For urgent or emergent issues, a gastroenterologist can be reached at any hour by calling [phone].   DIET:  We do recommend a small meal at first, but then you may proceed to your regular diet.  Drink plenty of fluids but you should avoid alcoholic beverages for 24 hours.  MEDICATIONS: Continue present medications. Resume Eliquis (apixaban) today at prior dose.  Please see handouts given to you by your recovery nurse.  ACTIVITY:  You should plan to take it  easy for the rest of today and you should NOT DRIVE or use heavy machinery until tomorrow (because of the sedation medicines used during the test).    FOLLOW UP: Our staff will call the number listed on your records the next business day following your procedure to check on you and address any questions or concerns that you may have regarding the information given to you following your procedure. If we do not reach you, we will leave a message.  However, if you are feeling well and you are not experiencing any problems, there is no need to return our call.  We will assume that you have returned to your regular daily activities without incident.  If any biopsies were taken you will be contacted by phone or by letter within the next 1-3 weeks.  Please call us at [phone] if you have not heard about the biopsies in 3 weeks.   Thank you for allowing [place] to provide for your healthcare needs today.   SIGNATURES/CONFIDENTIALITY: You and/or your care partner have signed paperwork which will be entered into your electronic medical record.  These signatures attest to the fact that that the information above on your After Visit Summary has been reviewed and is understood.  Full responsibility of the confidentiality of this discharge information lies with you and/or your care-partner.

## 2018-11-21 ENCOUNTER — Telehealth: Payer: Self-pay

## 2018-11-21 NOTE — Telephone Encounter (Signed)
  Follow up Call-  Call back number 11/20/2018  Post procedure Call Back phone  # [phone]  Permission to leave phone message Yes  Some recent data might be hidden     Patient questions:  Do you have a fever, pain , or abdominal swelling? No. Pain Score  0 *  Have you tolerated food without any problems? Yes.    Have you been able to return to your normal activities? Yes.    Do you have any questions about your discharge instructions: Diet   No. Medications  No. Follow up visit  No.  Do you have questions or concerns about your Care? No.  Actions: * If pain score is 4 or above: No action needed, pain <4.

## 2018-11-27 ENCOUNTER — Encounter: Payer: Self-pay | Admitting: Internal Medicine

## 2018-12-06 ENCOUNTER — Other Ambulatory Visit: Payer: Self-pay | Admitting: Family Medicine

## 2018-12-06 DIAGNOSIS — J309 Allergic rhinitis, unspecified: Secondary | ICD-10-CM

## 2018-12-08 ENCOUNTER — Encounter: Payer: Self-pay | Admitting: Family Medicine

## 2018-12-08 ENCOUNTER — Ambulatory Visit (INDEPENDENT_AMBULATORY_CARE_PROVIDER_SITE_OTHER): Payer: Medicare HMO | Admitting: Family Medicine

## 2018-12-08 VITALS — BP 144/74 | HR 76 | Temp 98.2°F | Wt 226.0 lb

## 2018-12-08 DIAGNOSIS — J4521 Mild intermittent asthma with (acute) exacerbation: Secondary | ICD-10-CM

## 2018-12-08 DIAGNOSIS — R69 Illness, unspecified: Secondary | ICD-10-CM | POA: Diagnosis not present

## 2018-12-08 MED ORDER — ALBUTEROL SULFATE (2.5 MG/3ML) 0.083% IN NEBU: 2.5000 mg | INHALATION_SOLUTION | Freq: Four times a day (QID) | RESPIRATORY_TRACT | 12 refills | Status: DC | PRN

## 2018-12-08 MED ORDER — FLUTICASONE PROPIONATE HFA 110 MCG/ACT IN AERO: 2.0000 | INHALATION_SPRAY | Freq: Two times a day (BID) | RESPIRATORY_TRACT | 12 refills | Status: DC

## 2018-12-08 MED ORDER — METHYLPREDNISOLONE ACETATE 80 MG/ML IJ SUSP
80.0000 mg | Freq: Once | INTRAMUSCULAR | Status: AC
  Administered 2018-12-08: 80 mg via INTRAMUSCULAR

## 2018-12-08 MED ORDER — ALBUTEROL SULFATE (2.5 MG/3ML) 0.083% IN NEBU
2.5000 mg | INHALATION_SOLUTION | Freq: Once | RESPIRATORY_TRACT | Status: AC
  Administered 2018-12-08: 2.5 mg via RESPIRATORY_TRACT

## 2018-12-08 MED ORDER — PREDNISONE 20 MG PO TABS: 40.0000 mg | ORAL_TABLET | Freq: Every day | ORAL | 0 refills | Status: AC

## 2018-12-08 NOTE — Progress Notes (Signed)
Matthew Trujillo DOB: Jan 22, 1951 Encounter date: 12/08/2018  This is a 67 y.o. male who presents with Chief Complaint  Patient presents with  . Asthma    had a cold last weekend, pt states it has triggered his asthma and albuterol is not helping    History of present illness: On albuterol, flonase, singulair.  Last Saturday had bad sore throat Sat and Sunday. Had runny nose, a lot of coughing. Felt like cold got better, but chest at night waking him at 3-4 wheezing, locked up. Albuterol is keeping him functional but just not gaining ground.   Used to have issues with asthma every few years. This is third time this year. A long time ago was on long acting beta agonist. Singulair not helping like it used to.    Does 2 puffs and uses spacer with albuterol inhaler.   No Known Allergies Current Meds  Medication Sig  . albuterol (PROVENTIL HFA;VENTOLIN HFA) 108 (90 Base) MCG/ACT inhaler INHALE 2 PUFFS EVERY 4 HOURS AS NEEDED WHEEZING  . amLODipine (NORVASC) 10 MG tablet TAKE 10 mg TABLET BY MOUTH EVERY DAY  . Calcium Carbonate-Vitamin D (CALTRATE 600+D) 600-400 MG-UNIT per tablet Take 1 tablet by mouth 2 (two) times daily.   . chlorthalidone (HYGROTON) 50 MG tablet TAKE 1 TABLET BY MOUTH EVERY DAY  . Cholecalciferol (VITAMIN D-3) 5000 units TABS Take 5,000 Units by mouth daily.  Marland Kitchen ELIQUIS 5 MG TABS tablet TAKE 1 TABLET BY MOUTH TWICE A DAY  . fluticasone (FLONASE) 50 MCG/ACT nasal spray Place 1 spray into both nostrils 2 (two) times daily.  . methimazole (TAPAZOLE) 5 MG tablet Take 5 mg by mouth daily.  . montelukast (SINGULAIR) 10 MG tablet Take 10 mg by mouth daily as needed.   . Multiple Vitamin (MULTIVITAMIN) tablet Take 1 tablet by mouth daily.    . Omega-3 Fatty Acids (FISH OIL) 1000 MG CAPS Take by mouth 2 (two) times daily.   . ramipril (ALTACE) 10 MG capsule Take 1 capsule (10 mg total) by mouth daily.  Marland Kitchen Spacer/Aero-Holding Chambers (E-Z SPACER) inhaler Use as instructed    Current Facility-Administered Medications for the 12/08/18 encounter (Office Visit) with Caren Macadam, MD  Medication  . 0.9 %  sodium chloride infusion  . albuterol (PROVENTIL) (2.5 MG/3ML) 0.083% nebulizer solution 5 mg  . ipratropium (ATROVENT) nebulizer solution 0.5 mg    Review of Systems  Constitutional: Negative for chills, fatigue and fever.  HENT: Negative for congestion, ear pain, sinus pressure, sinus pain and sore throat.   Respiratory: Positive for cough, chest tightness, shortness of breath and wheezing.   Cardiovascular: Negative for chest pain, palpitations and leg swelling.    Objective:  BP (!) 144/74 (BP Location: Left Arm, Patient Position: Sitting, Cuff Size: Normal)   Pulse 76   Temp 98.2 F (36.8 C) (Oral)   Wt 226 lb (102.5 kg)   SpO2 97%   BMI 29.41 kg/m   Weight: 226 lb (102.5 kg)   BP Readings from Last 3 Encounters:  12/08/18 (!) 144/74  11/20/18 122/89  11/15/18 120/76   Wt Readings from Last 3 Encounters:  12/08/18 226 lb (102.5 kg)  11/20/18 224 lb (101.6 kg)  11/15/18 224 lb 8 oz (101.8 kg)    Physical Exam Constitutional:      General: He is not in acute distress.    Appearance: He is well-developed.  Cardiovascular:     Rate and Rhythm: Normal rate and regular rhythm.  Heart sounds: Normal heart sounds. No murmur. No friction rub.     Comments: No lower extremity edema Pulmonary:     Effort: Pulmonary effort is normal. No respiratory distress.     Breath sounds: Decreased air movement present. Decreased breath sounds and wheezing (diffuse expiratory) present. No rhonchi or rales.  Neurological:     Mental Status: He is alert and oriented to person, place, and time.  Psychiatric:        Behavior: Behavior normal.     Assessment/Plan 1. Mild intermittent asthma with exacerbation We discussed continuing with flovent for 3 months and then discussed using it just prior to allergy season when he tends to flare.  Prednisone burst. Let us know if any worsening or if not significantly improved after prednisone burst. Albuterol q4 prn. Discussed new medication(s) today with patient. Discussed potential side effects and patient verbalized understanding.  - fluticasone (FLOVENT HFA) 110 MCG/ACT inhaler; Inhale 2 puffs into the lungs 2 (two) times daily.  Dispense: 1 Inhaler; Refill: 12 - methylPREDNISolone acetate (DEPO-MEDROL) injection 80 mg - albuterol (PROVENTIL) (2.5 MG/3ML) 0.083% nebulizer solution 2.5 mg    Return if symptoms worsen or fail to improve.     Micheline Rough, MD

## 2018-12-18 ENCOUNTER — Other Ambulatory Visit: Payer: Self-pay | Admitting: Cardiology

## 2018-12-22 ENCOUNTER — Encounter: Payer: Self-pay | Admitting: Family Medicine

## 2018-12-22 ENCOUNTER — Ambulatory Visit (INDEPENDENT_AMBULATORY_CARE_PROVIDER_SITE_OTHER): Payer: Medicare HMO | Admitting: Family Medicine

## 2018-12-22 VITALS — BP 138/74 | HR 79 | Temp 97.9°F | Wt 218.7 lb

## 2018-12-22 DIAGNOSIS — J0100 Acute maxillary sinusitis, unspecified: Secondary | ICD-10-CM | POA: Diagnosis not present

## 2018-12-22 DIAGNOSIS — J4520 Mild intermittent asthma, uncomplicated: Secondary | ICD-10-CM | POA: Diagnosis not present

## 2018-12-22 NOTE — Progress Notes (Addendum)
Matthew Trujillo DOB: [birth year] Encounter date: 12/22/2018  This is a 67 y.o. male who presents with Chief Complaint  Patient presents with  . Follow-up    pt states he is breathing much better, he uses inhaler in the morning (wakes up with a wheez) does not have to use it through out the day. No SOB, complians of drainage and runny nose    History of present illness: Last seen 12/08/18 with asthma exacerbation. Flovent started. Prednisone burst given.   Noticed that after he took the 5 days of prednisone, still had residual morning wheeze.    Then last Friday, Sat, Sunday chest was rattling, coughing up a lot of mucous. This got better a few days ago.   Sinuses are still draining a lot. Thick yellow drainage, then even has noted some specks of what he thinks are blood. Blowing nose a lot. Draining out front and back; more post nasal at this point.   Doing ok with the flovent.   Not noticed improvement with singulair; would like to stop this now that he is trying the flonase.     No Known Allergies Current Meds  Medication Sig  . albuterol (PROVENTIL HFA;VENTOLIN HFA) 108 (90 Base) MCG/ACT inhaler INHALE 2 PUFFS EVERY 4 HOURS AS NEEDED WHEEZING  . albuterol (PROVENTIL) (2.5 MG/3ML) 0.083% nebulizer solution Take 3 mLs (2.5 mg total) by nebulization every 6 (six) hours as needed for wheezing or shortness of breath.  Marland Kitchen amLODipine (NORVASC) 10 MG tablet TAKE 10 mg TABLET BY MOUTH EVERY DAY  . Calcium Carbonate-Vitamin D (CALTRATE 600+D) 600-400 MG-UNIT per tablet Take 1 tablet by mouth 2 (two) times daily.   . chlorthalidone (HYGROTON) 50 MG tablet TAKE 1 TABLET BY MOUTH EVERY DAY  . Cholecalciferol (VITAMIN D-3) 5000 units TABS Take 5,000 Units by mouth daily.  Marland Kitchen ELIQUIS 5 MG TABS tablet TAKE 1 TABLET BY MOUTH TWICE A DAY  . fluticasone (FLONASE) 50 MCG/ACT nasal spray Place 1 spray into both nostrils 2 (two) times daily.  . fluticasone (FLOVENT HFA) 110 MCG/ACT inhaler Inhale 2  puffs into the lungs 2 (two) times daily.  . methimazole (TAPAZOLE) 5 MG tablet Take 5 mg by mouth daily.  . Multiple Vitamin (MULTIVITAMIN) tablet Take 1 tablet by mouth daily.    . Omega-3 Fatty Acids (FISH OIL) 1000 MG CAPS Take by mouth 2 (two) times daily.   . ramipril (ALTACE) 10 MG capsule TAKE 1 CAPSULE BY MOUTH EVERY DAY  . Spacer/Aero-Holding Chambers (E-Z SPACER) inhaler Use as instructed  . [DISCONTINUED] montelukast (SINGULAIR) 10 MG tablet Take 10 mg by mouth daily as needed.    Current Facility-Administered Medications for the 12/22/18 encounter (Office Visit) with Caren Macadam, MD  Medication  . 0.9 %  sodium chloride infusion  . albuterol (PROVENTIL) (2.5 MG/3ML) 0.083% nebulizer solution 5 mg  . ipratropium (ATROVENT) nebulizer solution 0.5 mg    Review of Systems  Constitutional: Positive for fatigue. Negative for chills and fever.  HENT: Positive for congestion. Negative for facial swelling, sinus pain and sore throat.   Respiratory: Positive for cough and wheezing (just in morning). Negative for shortness of breath.     Objective:  BP 138/74 (BP Location: Left Arm, Patient Position: Sitting, Cuff Size: Normal)   Pulse 79   Temp 97.9 F (36.6 C) (Oral)   Wt 218 lb 11.2 oz (99.2 kg)   SpO2 98%   BMI 28.46 kg/m   Weight: 218 lb 11.2 oz (99.2 kg)  BP Readings from Last 3 Encounters:  12/22/18 138/74  12/08/18 (!) 144/74  11/20/18 122/89   Wt Readings from Last 3 Encounters:  12/22/18 218 lb 11.2 oz (99.2 kg)  12/08/18 226 lb (102.5 kg)  11/20/18 224 lb (101.6 kg)    Physical Exam Constitutional:      General: He is not in acute distress.    Appearance: He is well-developed.  HENT:     Head: Normocephalic and atraumatic.     Right Ear: Tympanic membrane, ear canal and external ear normal.     Left Ear: Tympanic membrane, ear canal and external ear normal.     Nose: Mucosal edema present.     Right Sinus: Maxillary sinus tenderness present.  No frontal sinus tenderness.     Left Sinus: No maxillary sinus tenderness or frontal sinus tenderness.     Comments: There is area of bleeding left nares.    Mouth/Throat:     Pharynx: Uvula midline. Posterior oropharyngeal erythema present.  Cardiovascular:     Rate and Rhythm: Normal rate and regular rhythm.  Pulmonary:     Effort: Pulmonary effort is normal. No respiratory distress.     Breath sounds: Normal breath sounds. No wheezing, rhonchi or rales.  Lymphadenopathy:     Head:     Right side of head: Submandibular adenopathy present.     Left side of head: Submandibular adenopathy present.     Cervical:     Right cervical: No superficial, deep or posterior cervical adenopathy.    Left cervical: No superficial, deep or posterior cervical adenopathy.     Upper Body:     Right upper body: No supraclavicular adenopathy.     Left upper body: No supraclavicular adenopathy.     Assessment/Plan  1. Asthma in adult, mild intermittent, uncomplicated Well controlled at this point; continue with flovent, albuterol prn.   2. Acute maxillary sinusitis, recurrence not specified Doxycycline (already has supply on hand from visit in November). Take at least 5 days. Let us know if bloody nasal discharge dose not improve after treatment. Use vasoline in nose at night.    Stop the flonase, mucinex at this point.   Return if symptoms worsen or fail to improve.    Micheline Rough, MD

## 2019-01-15 ENCOUNTER — Other Ambulatory Visit: Payer: Self-pay

## 2019-01-15 DIAGNOSIS — E0500 Thyrotoxicosis with diffuse goiter without thyrotoxic crisis or storm: Secondary | ICD-10-CM

## 2019-01-15 MED ORDER — METHIMAZOLE 5 MG PO TABS: 5.0000 mg | ORAL_TABLET | Freq: Every day | ORAL | 3 refills | Status: DC

## 2019-02-26 ENCOUNTER — Encounter: Payer: Self-pay | Admitting: Family Medicine

## 2019-02-26 ENCOUNTER — Ambulatory Visit (INDEPENDENT_AMBULATORY_CARE_PROVIDER_SITE_OTHER): Payer: Medicare HMO | Admitting: Family Medicine

## 2019-02-26 VITALS — BP 140/78 | HR 76 | Temp 97.8°F | Wt 224.8 lb

## 2019-02-26 DIAGNOSIS — M25561 Pain in right knee: Secondary | ICD-10-CM | POA: Diagnosis not present

## 2019-02-26 MED ORDER — PREDNISONE 20 MG PO TABS: ORAL_TABLET | ORAL | 0 refills | Status: DC

## 2019-02-26 NOTE — Progress Notes (Signed)
Matthew Trujillo DOB: 1951/07/15 Encounter date: 02/26/2019  This is a 68 y.o. male who presents with Chief Complaint  Patient presents with  . Knee Pain    sports injury, used knee brace and ice, painful to use stairs, can hear a pop    History of present illness:  Was playing pickleball and there was wet spot on floor. Slipped, but didn't fall. Didn't feel anything at the time, but after sitting at home noted that there was swelling and knee felt like it "locked up". Has been icing, wearing sleeve, trying to stay off knee.   If he walks slowly it doesn't hurt, but does bother him up and down stairs.   Hearing knee popping with getting up and down.   No prior right knee injury.   Didn't hurt at all while he finished game.   Not too painful unless going up and down stairs - then pain is 2-3/10. More pinching pain.     Allergies  Allergen Reactions  . Celebrex [Celecoxib]     Blood in stool   Current Meds  Medication Sig  . albuterol (PROVENTIL HFA;VENTOLIN HFA) 108 (90 Base) MCG/ACT inhaler INHALE 2 PUFFS EVERY 4 HOURS AS NEEDED WHEEZING  . albuterol (PROVENTIL) (2.5 MG/3ML) 0.083% nebulizer solution Take 3 mLs (2.5 mg total) by nebulization every 6 (six) hours as needed for wheezing or shortness of breath.  Marland Kitchen amLODipine (NORVASC) 10 MG tablet TAKE 10 mg TABLET BY MOUTH EVERY DAY  . Calcium Carbonate-Vitamin D (CALTRATE 600+D) 600-400 MG-UNIT per tablet Take 1 tablet by mouth 2 (two) times daily.   . chlorthalidone (HYGROTON) 50 MG tablet TAKE 1 TABLET BY MOUTH EVERY DAY  . Cholecalciferol (VITAMIN D-3) 5000 units TABS Take 5,000 Units by mouth daily.  Marland Kitchen ELIQUIS 5 MG TABS tablet TAKE 1 TABLET BY MOUTH TWICE A DAY  . fluticasone (FLONASE) 50 MCG/ACT nasal spray Place 1 spray into both nostrils 2 (two) times daily.  . fluticasone (FLOVENT HFA) 110 MCG/ACT inhaler Inhale 2 puffs into the lungs 2 (two) times daily.  . methimazole (TAPAZOLE) 5 MG tablet Take 1 tablet (5 mg total)  by mouth daily.  . Multiple Vitamin (MULTIVITAMIN) tablet Take 1 tablet by mouth daily.    . Omega-3 Fatty Acids (FISH OIL) 1000 MG CAPS Take by mouth 2 (two) times daily.   . ramipril (ALTACE) 10 MG capsule TAKE 1 CAPSULE BY MOUTH EVERY DAY  . Spacer/Aero-Holding Chambers (E-Z SPACER) inhaler Use as instructed   Current Facility-Administered Medications for the 02/26/19 encounter (Office Visit) with Caren Macadam, MD  Medication  . 0.9 %  sodium chloride infusion  . albuterol (PROVENTIL) (2.5 MG/3ML) 0.083% nebulizer solution 5 mg  . ipratropium (ATROVENT) nebulizer solution 0.5 mg    Review of Systems  Constitutional: Positive for activity change. Negative for chills and fever.  Musculoskeletal: Positive for arthralgias, gait problem and joint swelling.    Objective:  BP 140/78 (BP Location: Left Arm, Patient Position: Sitting, Cuff Size: Normal)   Pulse 76   Temp 97.8 F (36.6 C) (Oral)   Wt 224 lb 12.8 oz (102 kg)   SpO2 98%   BMI 29.26 kg/m   Weight: 224 lb 12.8 oz (102 kg)   BP Readings from Last 3 Encounters:  02/26/19 140/78  12/22/18 138/74  12/08/18 (!) 144/74   Wt Readings from Last 3 Encounters:  02/26/19 224 lb 12.8 oz (102 kg)  12/22/18 218 lb 11.2 oz (99.2 kg)  12/08/18 226  lb (102.5 kg)    Physical Exam Constitutional:      Appearance: Normal appearance. He is normal weight.  Pulmonary:     Effort: Pulmonary effort is normal.  Musculoskeletal:     Comments: There is a slight notable edema of the right knee compared to the left.  There is a minimal effusion palpable on exam.  Range of motion is limited with some stiffness and effort to fully extend the knee, although he comes close.  He is also limited with full flexion but can make it over 120 degrees.  Knee feels stable.  Mild medial joint line tenderness pain.  There is pain the medial aspect of the knee with grinding test.  No patellar tenderness.  No posterior knee tenderness.  Neurological:      Mental Status: He is alert.     Assessment/Plan 1. Right medial knee pain I suspect a medial meniscus injury to the knee.  He has tolerated prednisone very well in the past and had difficulty with other anti-inflammatories so we will give him a prednisone burst to help with inflammation.  I have asked him to update me in 2 weeks time.  If he does not have significant improvement with this I would certainly have Ortho see him and evaluate.  We discussed that sometimes with meniscal injuries pain can improve once inflammation is better, and other times it ends up being a surgical treatment.  I have advised him to limit any activities that involve planting and twisting on the knee and to engage in exercise that will be less weightbearing (swimming or cycling) but to limit these activities if they cause increased pain.  We discussed continuing to ice, especially after activity.  Okay to continue with soft knee brace as this provides some support for him. - predniSONE (DELTASONE) 20 MG tablet; Take 3 tabs daily x 3 days, 2 tabs daily x 3 days, 1 tab daily x 3 days, then 1/2 tab daily x 2 days.  Dispense: 19 tablet; Refill: 0    Return if symptoms worsen or fail to improve.  I do not need to see him back to make orthopedic referral if pain is not improving.    Micheline Rough, MD

## 2019-02-26 NOTE — Patient Instructions (Addendum)
Take prednisone (with food in the morning) as directed. Ice twice daily and after activity. OK to continue with knee support brace. Let me know if any worsening.   Update me in 2 weeks time on pain. OK to do this through [employer]. If you are not improving as expected we will have you see ortho.  Meniscus Tear  A meniscus tear is a knee injury that happens when a piece of the meniscus is torn. The meniscus is a thick, rubbery, wedge-shaped cartilage in the knee. Two menisci are located in each knee. They sit between the upper bone (femur) and lower bone (tibia) that make up the knee joint. Each meniscus acts as a shock absorber for the knee. A torn meniscus is one of the most common types of knee injuries. This injury can range from mild to severe. Surgery may be needed to repair a severe tear. What are the causes? This condition may be caused by any kneeling, squatting, twisting, or pivoting movement. Sports-related injuries are the most common cause. These often occur from:  Running and stopping suddenly. ? Changing direction. ? Being tackled or knocked off your feet.  Lifting or carrying heavy weights. As people get older, their menisci get thinner and weaker. In these people, tears can happen more easily, such as from climbing stairs. What increases the risk? You are more likely to develop this condition if you:  Play contact sports.  Have a job that requires kneeling or squatting.  Are male.  Are over 48 years old. What are the signs or symptoms? Symptoms of this condition include:  Knee pain, especially at the side of the knee joint. You may feel pain when the injury occurs, or you may only hear a pop and feel pain later.  A feeling that your knee is clicking, catching, locking, or giving way.  Not being able to fully bend or extend your knee.  Bruising or swelling in your knee. How is this diagnosed? This condition may be diagnosed based on your symptoms and a  physical exam. You may also have tests, such as:  X-rays.  MRI.  A procedure to look inside your knee with a narrow surgical telescope (arthroscopy). You may be referred to a knee specialist (orthopedic surgeon). How is this treated? Treatment for this injury depends on the severity of the tear. Treatment for a mild tear may include:  Rest.  Medicine to reduce pain and swelling. This is usually a nonsteroidal anti-inflammatory drug (NSAID), like ibuprofen.  A knee brace, sleeve, or wrap.  Using crutches or a walker to keep weight off your knee and to help you walk.  Exercises to strengthen your knee (physical therapy). You may need surgery if you have a severe tear or if other treatments are not working. Follow these instructions at home: If you have a brace, sleeve, or wrap:  Wear it as told by your health care provider. Remove it only as told by your health care provider.  Loosen the brace, sleeve, or wrap if your toes tingle, become numb, or turn cold and blue.  Keep the brace, sleeve, or wrap clean and dry.  If the brace, sleeve, or wrap is not waterproof: ? Do not let it get wet. ? Cover it with a watertight covering when you take a bath or shower. Managing pain and swelling   Take over-the-counter and prescription medicines only as told by your health care provider.  If directed, put ice on your knee: ? If you  have a removable brace, sleeve, or wrap, remove it as told by your health care provider. ? Put ice in a plastic bag. ? Place a towel between your skin and the bag. ? Leave the ice on for 20 minutes, 2-3 times per day.  Move your toes often to avoid stiffness and to lessen swelling.  Raise (elevate) the injured area above the level of your heart while you are sitting or lying down. Activity  Do not use the injured limb to support your body weight until your health care provider says that you can. Use crutches or a walker as told by your health care  provider.  Return to your normal activities as told by your health care provider. Ask your health care provider what activities are safe for you.  Perform range-of-motion exercises only as told by your health care provider.  Begin doing exercises to strengthen your knee and leg muscles only as told by your health care provider. After you recover, your health care provider may recommend these exercises to help prevent another injury. General instructions  Use a knee brace, sleeve, or wrap as told by your health care provider.  Ask your health care provider when it is safe to drive if you have a brace, sleeve, or wrap on your knee.  Do not use any products that contain nicotine or tobacco, such as cigarettes, e-cigarettes, and chewing tobacco. If you need help quitting, ask your health care provider.  Ask your health care provider if the medicine prescribed to you: ? Requires you to avoid driving or using heavy machinery. ? Can cause constipation. You may need to take these actions to prevent or treat constipation:  Drink enough fluid to keep your urine pale yellow.  Take over-the-counter or prescription medicines.  Eat foods that are high in fiber, such as beans, whole grains, and fresh fruits and vegetables.  Limit foods that are high in fat and processed sugars, such as fried or sweet foods.  Keep all follow-up visits as told by your health care provider. This is important. Contact a health care provider if:  You have a fever.  Your knee becomes red, tender, or swollen.  Your pain medicine is not helping.  Your symptoms get worse or do not improve after 2 weeks of home care. Summary  A meniscus tear is a knee injury that happens when a piece of the meniscus is torn.  Treatment for this injury depends on the severity of the tear. You may need surgery if you have a severe tear or if other treatments are not working.  Rest, ice, and raise (elevate) your injured knee as told  by your health care provider. This will help lessen pain and swelling.  Contact a health care provider if you have new symptoms, or your symptoms get worse or do not improve after 2 weeks of home care.  Keep all follow-up visits as told by your health care provider. This is important. This information is not intended to replace advice given to you by your health care provider. Make sure you discuss any questions you have with your health care provider. Document Released: 03/05/2003 Document Revised: 06/27/2018 Document Reviewed: 06/27/2018 Elsevier Interactive Patient Education  2019 [employer].

## 2019-03-09 ENCOUNTER — Encounter: Payer: Self-pay | Admitting: Family Medicine

## 2019-03-09 ENCOUNTER — Other Ambulatory Visit: Payer: Self-pay | Admitting: Family Medicine

## 2019-03-09 DIAGNOSIS — M25561 Pain in right knee: Secondary | ICD-10-CM

## 2019-03-19 ENCOUNTER — Telehealth (INDEPENDENT_AMBULATORY_CARE_PROVIDER_SITE_OTHER): Payer: Self-pay

## 2019-03-19 NOTE — Telephone Encounter (Signed)
Called patient and asked the screening questions.  Do you have now or have you had in the past 7 days a fever and/or chills? NO  Do you have now or have you had in the past 7 days a cough? NO  Do you have now or have you had in the last 7 days nausea, vomiting or abdominal pain? NO  Have you been exposed to anyone who has tested positive for COVID-19? NO  Have you or anyone who lives with you traveled within the last month? NO 

## 2019-03-20 ENCOUNTER — Other Ambulatory Visit: Payer: Self-pay

## 2019-03-20 ENCOUNTER — Encounter (INDEPENDENT_AMBULATORY_CARE_PROVIDER_SITE_OTHER): Payer: Self-pay | Admitting: Orthopaedic Surgery

## 2019-03-20 ENCOUNTER — Ambulatory Visit (INDEPENDENT_AMBULATORY_CARE_PROVIDER_SITE_OTHER): Payer: Medicare HMO

## 2019-03-20 ENCOUNTER — Ambulatory Visit (INDEPENDENT_AMBULATORY_CARE_PROVIDER_SITE_OTHER): Payer: Medicare HMO | Admitting: Orthopaedic Surgery

## 2019-03-20 VITALS — Ht 73.5 in | Wt 224.0 lb

## 2019-03-20 DIAGNOSIS — Z86711 Personal history of pulmonary embolism: Secondary | ICD-10-CM | POA: Diagnosis not present

## 2019-03-20 DIAGNOSIS — M25561 Pain in right knee: Secondary | ICD-10-CM

## 2019-03-20 DIAGNOSIS — Z86718 Personal history of other venous thrombosis and embolism: Secondary | ICD-10-CM | POA: Diagnosis not present

## 2019-03-20 DIAGNOSIS — W1849XA Other slipping, tripping and stumbling without falling, initial encounter: Secondary | ICD-10-CM | POA: Diagnosis not present

## 2019-03-20 DIAGNOSIS — Z7901 Long term (current) use of anticoagulants: Secondary | ICD-10-CM

## 2019-03-20 NOTE — Progress Notes (Addendum)
Office Visit Note   Patient: Matthew Trujillo           Date of Birth: Jul 21, 1951           MRN: [id number] Visit Date: 03/20/2019              Requested by: Caren Macadam, MD [place], [place] [id number] PCP: Caren Macadam, MD   Assessment & Plan: Visit Diagnoses:  1. Acute pain of right knee     Plan: We will continue conservative treatment he can use some Tylenol continue using ice after walking activities avoid twisting recheck him in 3 weeks.  If he is having persistent problems will consider MRI scan if diagnostic MRIs are changed from emergency only.  Recheck 3 weeks.  Follow-Up Instructions: Return in about 3 weeks (around 04/10/2019).   Orders:  Orders Placed This Encounter  Procedures  . XR KNEE 3 VIEW RIGHT   No orders of the defined types were placed in this encounter.     Procedures: No procedures performed   Clinical Data: No additional findings.   Subjective: Chief Complaint  Patient presents with  . Right Knee - Pain    DOI 02/22/2019    HPI 68 year old male with right knee pain he slipped on a wet spot when he was playing pickle ball did not actually fall date of injury was 02/22/2019.  He is on Eliquis he has been taking some prednisone knee sleeve ice and then heat.  Had previous Achilles tendon repair 2001 by Dr. Lorin Mercy and also was part of the 2003 [place] trip patient states his knee pops a lot when he sits or stands sometimes feels unstable still has some swelling.  His knee is not actually locked he denies fever or chills.  He is on Eliquis due to history of DVT x2 and first time DVT was associated with PE.  Review of Systems positive for hypothyroidism Graves' disease, hypertension, DVT with PE onset with air travel.,  History of palpitations, Achilles tendon repair.  Hyperlipidemia otherwise negative is obtains HPI.   Objective: Vital Signs: Ht 6' 1.5" (1.867 m)   Wt 224 lb (101.6 kg)   BMI 29.15 kg/m    Physical Exam Constitutional:      Appearance: He is well-developed.  HENT:     Head: Normocephalic and atraumatic.  Eyes:     Pupils: Pupils are equal, round, and reactive to light.  Neck:     Thyroid: No thyromegaly.     Trachea: No tracheal deviation.  Cardiovascular:     Rate and Rhythm: Normal rate.  Pulmonary:     Effort: Pulmonary effort is normal.     Breath sounds: No wheezing.  Abdominal:     General: Bowel sounds are normal.     Palpations: Abdomen is soft.  Skin:    General: Skin is warm and dry.     Capillary Refill: Capillary refill takes less than 2 seconds.  Neurological:     Mental Status: He is alert and oriented to person, place, and time.  Psychiatric:        Behavior: Behavior normal.        Thought Content: Thought content normal.        Judgment: Judgment normal.     Ortho Exam patient has 2+ knee effusion mild crepitus with knee range of motion occasional flexion extension he has a pop.  No significant plica no increased warmth collateral crucial ligament exam is normal  negative Lockman negative pivot shift.  No Baker's cyst noted.  Ankle range of motion logroll hips is normal sensation foot is normal.  Specialty Comments:  No specialty comments available.  Imaging: Xr Knee 3 View Right  Result Date: 03/20/2019 Three-view x-rays AP both knees standing standing lateral right knee and sunrise patellar x-rays are obtained and reviewed.  This shows normal bony anatomy slight joint space narrowing bilaterally no acute changes. Impression minimal right knee degenerative changes negative for acute bone injury    PMFS History: Patient Active Problem List   Diagnosis Date Noted  . Hyperlipidemia with target LDL less than 100 05/04/2018  . High risk medication use 11/11/2017  . Graves disease 10/26/2017  . 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 Palpitations 09/01/2017  . Hyperthyroidism 08/30/2017  .  Bilateral edema of lower extremity 08/30/2017  . Essential hypertension 05/21/2010  . History of pulmonary embolism 05/21/2010  . Allergic rhinitis 05/21/2010  . Asthma in adult, mild intermittent, uncomplicated 88/91/6945   Past Medical History:  Diagnosis Date  . ALLERGIC RHINITIS 05/21/2010  . ASTHMA 05/21/2010  . Factor V Leiden ([place])   . HYPERTENSION 05/21/2010   Diffiult to control  . PULMONARY EMBOLISM 05/21/2010    Family History  Problem Relation Age of Onset  . Cancer Father   . Kidney failure Father        single kidney; hx of recurrent infections  . Cancer Brother   . Hypertension Mother   . Hypothyroidism Mother   . Hypothyroidism Sister   . Heart disease Neg Hx   . Colon cancer Neg Hx   . Esophageal cancer Neg Hx   . Stomach cancer Neg Hx   . Rectal cancer Neg Hx     Past Surgical History:  Procedure Laterality Date  . ACHILLES TENDON REPAIR    . DOPPLER ECHOCARDIOGRAPHY  12/30/2011   EF >55%; MILD LVH,MILD LAE,NORMAL DIASTOLIC FUNCTION  . RENAL DOPPLER  12/30/2011   NORMAL RENAL   Social History   Occupational History  . Not on file  Tobacco Use  . Smoking status: Never Smoker  . Smokeless tobacco: Never Used  Substance and Sexual Activity  . Alcohol use: Yes    Comment: occasional   . Drug use: No  . Sexual activity: Not on file

## 2019-04-10 ENCOUNTER — Ambulatory Visit (INDEPENDENT_AMBULATORY_CARE_PROVIDER_SITE_OTHER): Payer: Medicare HMO | Admitting: Orthopaedic Surgery

## 2019-04-10 ENCOUNTER — Other Ambulatory Visit: Payer: Self-pay

## 2019-04-10 ENCOUNTER — Ambulatory Visit (INDEPENDENT_AMBULATORY_CARE_PROVIDER_SITE_OTHER): Payer: Medicare HMO | Admitting: Endocrinology

## 2019-04-10 DIAGNOSIS — E0590 Thyrotoxicosis, unspecified without thyrotoxic crisis or storm: Secondary | ICD-10-CM | POA: Diagnosis not present

## 2019-04-10 NOTE — Patient Instructions (Addendum)
blood tests are requested for you today.  We'll let you know about the results. If ever you have fever while taking methimazole, stop it and call us, even if the reason is obvious, because of the risk of a rare side-effect. Please come back for a follow-up appointment in 6 months.   

## 2019-04-10 NOTE — Progress Notes (Addendum)
Subjective:    Patient ID: Matthew Trujillo, male    DOB: 1951/06/11, 68 y.o.   MRN: [id number]  HPI telehealth visit today via doxy video visit.  Alternatives to telehealth are presented to this patient, and the patient agrees to the telehealth visit. Pt is advised of the cost of the visit, and agrees to this, also.   Patient is at home, and I am at the office.   Pt returns for f/u of hyperthyroidism (dx'ed with hyperthyroidism in 2017; US showed autoimmune thyroidtis; he declines RAI).  He takes tapazole 5 mg qd, as rx'ed.  pt states he feels well in general.   Past Medical History:  Diagnosis Date  . ALLERGIC RHINITIS 05/21/2010  . ASTHMA 05/21/2010  . Factor V Leiden ([place])   . HYPERTENSION 05/21/2010   Diffiult to control  . 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 ACHILLES TENDON REPAIR    . DOPPLER ECHOCARDIOGRAPHY  12/30/2011   EF >55%; MILD LVH,MILD LAE,NORMAL DIASTOLIC FUNCTION  . RENAL DOPPLER  12/30/2011   NORMAL RENAL    Social History   Socioeconomic History  . Marital status: Married    Spouse name: Not on file  . Number of children: Not on file  . Years of education: Not on file  . Highest education level: Not on file  Occupational History  . Not on file  Social Needs  . Financial resource strain: Not on file  . Food insecurity:    Worry: Not on file    Inability: Not on file  . Transportation needs:    Medical: Not on file    Non-medical: Not on file  Tobacco Use  . Smoking status: Never Smoker  . Smokeless tobacco: Never Used  Substance and Sexual Activity  . Alcohol use: Yes    Comment: occasional   . Drug use: No  . Sexual activity: Not on file  Lifestyle  . Physical activity:    Days per week: Not on file    Minutes per session: Not on file  . Stress: Not on file  Relationships  . Social connections:    Talks on phone: Not on file    Gets together: Not on file    Attends religious service:  Not on file    Active member of club or organization: Not on file    Attends meetings of clubs or organizations: Not on file    Relationship status: Not on file  . Intimate partner violence:    Fear of current or ex partner: Not on file    Emotionally abused: Not on file    Physically abused: Not on file    Forced sexual activity: Not on file  Other Topics Concern  . Not on file  Social History Narrative   He is a married father of 2. He does exercise about 3-4 days a week with no true symptoms. Does not smoke and occasional social alcohol.     Current Outpatient Medications on File Prior to Visit  Medication Sig Dispense Refill  . albuterol (PROVENTIL HFA;VENTOLIN HFA) 108 (90 Base) MCG/ACT inhaler INHALE 2 PUFFS EVERY 4 HOURS AS NEEDED WHEEZING 8.5 Inhaler 2  . albuterol (PROVENTIL) (2.5 MG/3ML) 0.083% nebulizer solution Take 3 mLs (2.5 mg total) by nebulization every 6 (six) hours as needed for wheezing or shortness of breath. 75 mL 12  . amLODipine (NORVASC) 10 MG tablet TAKE 10 mg TABLET BY MOUTH EVERY DAY  90 tablet 3  . Calcium Carbonate-Vitamin D (CALTRATE 600+D) 600-400 MG-UNIT per tablet Take 1 tablet by mouth 2 (two) times daily.     . chlorthalidone (HYGROTON) 50 MG tablet TAKE 1 TABLET BY MOUTH EVERY DAY 90 tablet 3  . Cholecalciferol (VITAMIN D-3) 5000 units TABS Take 5,000 Units by mouth daily.    Marland Kitchen ELIQUIS 5 MG TABS tablet TAKE 1 TABLET BY MOUTH TWICE A DAY 180 tablet 3  . fluticasone (FLONASE) 50 MCG/ACT nasal spray Place 1 spray into both nostrils 2 (two) times daily. 16 g 1  . fluticasone (FLOVENT HFA) 110 MCG/ACT inhaler Inhale 2 puffs into the lungs 2 (two) times daily. 1 Inhaler 12  . methimazole (TAPAZOLE) 5 MG tablet Take 1 tablet (5 mg total) by mouth daily. 30 tablet 3  . Multiple Vitamin (MULTIVITAMIN) tablet Take 1 tablet by mouth daily.      . Omega-3 Fatty Acids (FISH OIL) 1000 MG CAPS Take by mouth 2 (two) times daily.     . ramipril (ALTACE) 10 MG capsule  TAKE 1 CAPSULE BY MOUTH EVERY DAY 90 capsule 1  . Spacer/Aero-Holding Chambers (E-Z SPACER) inhaler Use as instructed 1 each 2   Current Facility-Administered Medications on File Prior to Visit  Medication Dose Route Frequency Provider Last Rate Last Dose  . 0.9 %  sodium chloride infusion  500 mL Intravenous Once Irene Shipper, MD      . albuterol (PROVENTIL) (2.5 MG/3ML) 0.083% nebulizer solution 5 mg  5 mg Nebulization Once Roselee Culver, MD      . ipratropium (ATROVENT) nebulizer solution 0.5 mg  0.5 mg Nebulization Once Roselee Culver, MD        Allergies  Allergen Reactions  . Celebrex [Celecoxib]     Blood in stool    Family History  Problem Relation Age of Onset  . Cancer Father   . Kidney failure Father        single kidney; hx of recurrent infections  . Cancer Brother   . Hypertension Mother   . Hypothyroidism Mother   . Hypothyroidism Sister   . Heart disease Neg Hx   . Colon cancer Neg Hx   . Esophageal cancer Neg Hx   . Stomach cancer Neg Hx   . Rectal cancer Neg Hx     Review of Systems Denies fever.      Objective:   Physical Exam     Assessment & Plan:  Hyperthyroidism: due for recheck.  Asthma: in this setting, he should maintain euthyroidism.    Patient Instructions  blood tests are requested for you today.  We'll let you know about the results.   If ever you have fever while taking methimazole, stop it and call us, even if the reason is obvious, because of the risk of a rare side-effect.   Please come back for a follow-up appointment in 6 months.

## 2019-04-24 ENCOUNTER — Encounter (INDEPENDENT_AMBULATORY_CARE_PROVIDER_SITE_OTHER): Payer: Self-pay | Admitting: Orthopaedic Surgery

## 2019-04-24 ENCOUNTER — Telehealth: Payer: Self-pay | Admitting: *Deleted

## 2019-04-24 ENCOUNTER — Ambulatory Visit (INDEPENDENT_AMBULATORY_CARE_PROVIDER_SITE_OTHER): Payer: Medicare HMO | Admitting: Orthopaedic Surgery

## 2019-04-24 ENCOUNTER — Other Ambulatory Visit: Payer: Self-pay

## 2019-04-24 ENCOUNTER — Other Ambulatory Visit (INDEPENDENT_AMBULATORY_CARE_PROVIDER_SITE_OTHER): Payer: Medicare HMO

## 2019-04-24 VITALS — Ht 74.0 in | Wt 225.0 lb

## 2019-04-24 DIAGNOSIS — E0590 Thyrotoxicosis, unspecified without thyrotoxic crisis or storm: Secondary | ICD-10-CM | POA: Diagnosis not present

## 2019-04-24 DIAGNOSIS — M94261 Chondromalacia, right knee: Secondary | ICD-10-CM | POA: Diagnosis not present

## 2019-04-24 LAB — T4, FREE: Free T4: 0.83 ng/dL (ref 0.60–1.60)

## 2019-04-24 LAB — TSH: TSH: 2.48 u[IU]/mL (ref 0.35–4.50)

## 2019-04-24 NOTE — Telephone Encounter (Signed)
04/24/19 LMOM @ 3:19, re: follow up appointment.

## 2019-04-24 NOTE — Progress Notes (Signed)
Office Visit Note   Patient: Matthew Trujillo           Date of Birth: 03-09-1951           MRN: [id number] Visit Date: 04/24/2019              Requested by: Caren Macadam, MD [place], [place] [id number] PCP: Caren Macadam, MD   Assessment & Plan: Visit Diagnoses:  1. Chondromalacia, right knee     Plan: Patient can gradually resume normal activities.  If he develops increased pain swelling he will let us know currently has significant patellofemoral wear with popping but no pain symptoms and it does not limit his activities.  If he has increased problems he can return.  Follow-Up Instructions: Return if symptoms worsen or fail to improve.   Orders:  No orders of the defined types were placed in this encounter.  No orders of the defined types were placed in this encounter.     Procedures: No procedures performed   Clinical Data: No additional findings.   Subjective: Chief Complaint  Patient presents with  . Right Knee - Follow-up    HPI 68 year old male returns for follow-up of right knee injury from 02/22/2019.  Patient is used Tylenol ice, knee sleeve.  He states swelling is decreased.  He is walking twice daily ice is and after he walks.  He notices when he goes up and down stairs he hears audible crunchiness when the patellofemoral joint is loaded or when he gets from sitting to standing position but does not have pain with this.  He is able to demonstrate audible sound stepping up and down on a step.  He is not tried to resume playing pickle ball and of course that activity is on hold due to the coronavirus.  Review of Systems reviewed updated unchanged from 03/20/2019 office visit other than as mentioned above.   Objective: Vital Signs: Ht 6\' 2"  (1.88 m)   Wt 225 lb (102.1 kg)   BMI 28.89 kg/m   Physical Exam Constitutional:      Appearance: He is well-developed.  HENT:     Head: Normocephalic and atraumatic.  Eyes:   Pupils: Pupils are equal, round, and reactive to light.  Neck:     Thyroid: No thyromegaly.     Trachea: No tracheal deviation.  Cardiovascular:     Rate and Rhythm: Normal rate.  Pulmonary:     Effort: Pulmonary effort is normal.     Breath sounds: No wheezing.  Abdominal:     General: Bowel sounds are normal.     Palpations: Abdomen is soft.  Skin:    General: Skin is warm and dry.     Capillary Refill: Capillary refill takes less than 2 seconds.  Neurological:     Mental Status: He is alert and oriented to person, place, and time.  Psychiatric:        Behavior: Behavior normal.        Thought Content: Thought content normal.        Judgment: Judgment normal.     Ortho Exam patient has audible crepitus with patellofemoral loading quadriceps contracture as well as stepping up and down on a step getting up and down out of a chair.  No knee effusion no lateral patellar tracking.  He is able ambulate without a limp.  He gets in and out of the chair comfortably.  Specialty Comments:  No specialty comments available.  Imaging:  No results found.   PMFS History: Patient Active Problem List   Diagnosis Date Noted  . Hyperlipidemia with target LDL less than 100 05/04/2018  . High risk medication use 11/11/2017  . Graves disease 10/26/2017  . 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 Palpitations 09/01/2017  . Hyperthyroidism 08/30/2017  . Bilateral edema of lower extremity 08/30/2017  . Essential hypertension 05/21/2010  . History of pulmonary embolism 05/21/2010  . Allergic rhinitis 05/21/2010  . Asthma in adult, mild intermittent, uncomplicated 00/51/1021   Past Medical History:  Diagnosis Date  . ALLERGIC RHINITIS 05/21/2010  . ASTHMA 05/21/2010  . Factor V Leiden ([place])   . HYPERTENSION 05/21/2010   Diffiult to control  . PULMONARY EMBOLISM 05/21/2010    Family History  Problem Relation Age of Onset  . Cancer Father   .  Kidney failure Father        single kidney; hx of recurrent infections  . Cancer Brother   . Hypertension Mother   . Hypothyroidism Mother   . Hypothyroidism Sister   . Heart disease Neg Hx   . Colon cancer Neg Hx   . Esophageal cancer Neg Hx   . Stomach cancer Neg Hx   . Rectal cancer Neg Hx     Past Surgical History:  Procedure Laterality Date  . ACHILLES TENDON REPAIR    . DOPPLER ECHOCARDIOGRAPHY  12/30/2011   EF >55%; MILD LVH,MILD LAE,NORMAL DIASTOLIC FUNCTION  . RENAL DOPPLER  12/30/2011   NORMAL RENAL   Social History   Occupational History  . Not on file  Tobacco Use  . Smoking status: Never Smoker  . Smokeless tobacco: Never Used  Substance and Sexual Activity  . Alcohol use: Yes    Comment: occasional   . Drug use: No  . Sexual activity: Not on file

## 2019-04-30 ENCOUNTER — Other Ambulatory Visit: Payer: Self-pay | Admitting: Endocrinology

## 2019-04-30 DIAGNOSIS — E0500 Thyrotoxicosis with diffuse goiter without thyrotoxic crisis or storm: Secondary | ICD-10-CM

## 2019-05-03 ENCOUNTER — Telehealth: Payer: Self-pay | Admitting: Cardiology

## 2019-05-06 NOTE — Progress Notes (Signed)
Virtual Visit via Video Note   This visit type was conducted due to national recommendations for restrictions regarding the COVID-19 Pandemic (e.g. social distancing) in an effort to limit this patient's exposure and mitigate transmission in our community.  Due to his co-morbid illnesses, this patient is at least at moderate risk for complications without adequate follow up.  This format is felt to be most appropriate for this patient at this time.  All issues noted in this document were discussed and addressed.  A limited physical exam was performed with this format.  Please refer to the patient's chart for his consent to telehealth for [facility].   Patient has given verbal permission to conduct this visit via virtual appointment and to bill insurance 05/07/2019 8:13 AM     Evaluation Performed:  Follow-up visit  Date:  05/07/2019   ID:  Matthew Trujillo, DOB 1951/08/03, MRN [id number]  Patient Location: Home Provider Location: Other:  [place] [facility]  PCP:  Caren Macadam, MD Dr. Burnice Logan retired. Cardiologist:  No primary care provider on file. Electrophysiologist:  None   Chief Complaint:  Annual f/u - HTN palpitations, Hyperthyroidism  History of Present Illness:    Matthew Trujillo is a 68 y.o. male with PMH notable for hypertension hyperlipidemia as well as venous stasis edema (with history of LLE DVT) with hyperthyroidism (treated with methimazole) who presents via audio/video conferencing for a telehealth visit today.  Matthew Trujillo was last seen in May 2019.  By this time his thyroid was better under control and we are able to back down his beta-blocker to 25 mg twice daily.  Noted mild left-sided edema that went down at night.  Well-controlled with chlorthalidone.   Interval History: Matthew Trujillo is doing well.  He retired end of 2019 -- now able to get more exercise - was down to 216 lb -- Knee injury in Feb -- unable to do MRI without emergency, concern for torn  Meniscus -- now back to normal activity.  Had gained some weight.  Off & on swelling in L leg - maybe related to diet or prolonged sitting --> usually better with support stockings & elevation.  Now on lowest possible dose of Methimazole - maintenance dose. Most recent Thyroid panel was "good"] No more palpitations.  BP in February was in 130s - now up a bit b/c less exercise.  Has been of Beta Blocker for > 1 year.  No real change in BP - only the resting HR.  Due to see PCP for routine f/u - discussing starting Statin. -> getting retested this summer.   Stable from CV standpoint.  Cardiovascular ROS: no chest pain or dyspnea on exertion positive for - edema and stable edema negative for - irregular heartbeat, orthopnea, palpitations, paroxysmal nocturnal dyspnea, rapid heart rate, shortness of breath or syncope/near syncope. TIA/ amaurosis fugax. claudication  The patient does not have symptoms concerning for COVID-19 infection (fever, chills, cough, or new shortness of breath).  The patient is practicing social distancing.  ROS:  Please see the history of present illness.    Review of Systems  Constitutional: Negative for chills, fever and malaise/fatigue.  HENT: Positive for congestion (pollen). Negative for nosebleeds.   Respiratory: Positive for cough (- related to pollen). Negative for shortness of breath.   Gastrointestinal: Negative for blood in stool, heartburn and melena.  Genitourinary: Negative for hematuria.  Musculoskeletal: Positive for falls (in Feb - slipped on wet BB floor) and joint pain (R knee).  Neurological: Negative for dizziness.  Endo/Heme/Allergies: Positive for environmental allergies.  Psychiatric/Behavioral: Negative.     Past Medical History:  Diagnosis Date  . ALLERGIC RHINITIS 05/21/2010  . ASTHMA 05/21/2010  . Factor V Leiden ([place])   . HYPERTENSION 05/21/2010   Diffiult to control  . PULMONARY EMBOLISM 05/21/2010   Past Surgical History:  Procedure  Laterality Date  . ACHILLES TENDON REPAIR    . DOPPLER ECHOCARDIOGRAPHY  12/30/2011   EF >55%; MILD LVH,MILD LAE,NORMAL DIASTOLIC FUNCTION  . RENAL DOPPLER  12/30/2011   NORMAL RENAL     Current Meds  Medication Sig  . albuterol (PROVENTIL HFA;VENTOLIN HFA) 108 (90 Base) MCG/ACT inhaler INHALE 2 PUFFS EVERY 4 HOURS AS NEEDED WHEEZING  . albuterol (PROVENTIL) (2.5 MG/3ML) 0.083% nebulizer solution Take 3 mLs (2.5 mg total) by nebulization every 6 (six) hours as needed for wheezing or shortness of breath.  Marland Kitchen amLODipine (NORVASC) 10 MG tablet TAKE 10 mg TABLET BY MOUTH EVERY DAY  . Calcium Carbonate-Vitamin D (CALTRATE 600+D) 600-400 MG-UNIT per tablet Take 1 tablet by mouth 2 (two) times daily.   . chlorthalidone (HYGROTON) 50 MG tablet TAKE 1 TABLET BY MOUTH EVERY DAY  . Cholecalciferol (VITAMIN D-3) 5000 units TABS Take 5,000 Units by mouth daily.  Marland Kitchen ELIQUIS 5 MG TABS tablet TAKE 1 TABLET BY MOUTH TWICE A DAY  . fluticasone (FLONASE) 50 MCG/ACT nasal spray Place 1 spray into both nostrils 2 (two) times daily.  . fluticasone (FLOVENT HFA) 110 MCG/ACT inhaler Inhale 2 puffs into the lungs 2 (two) times daily.  . methimazole (TAPAZOLE) 5 MG tablet TAKE 1 TABLET BY MOUTH EVERY DAY  . Multiple Vitamin (MULTIVITAMIN) tablet Take 1 tablet by mouth daily.    . Omega-3 Fatty Acids (FISH OIL) 1000 MG CAPS Take by mouth 2 (two) times daily.   . ramipril (ALTACE) 10 MG capsule TAKE 1 CAPSULE BY MOUTH EVERY DAY  . Spacer/Aero-Holding Chambers (E-Z SPACER) inhaler Use as instructed   Current Facility-Administered Medications for the 05/07/19 encounter (Telemedicine) with Leonie Man, MD  Medication  . 0.9 %  sodium chloride infusion  . ipratropium (ATROVENT) nebulizer solution 0.5 mg     Allergies:   Celebrex [celecoxib]   Social History   Tobacco Use  . Smoking status: Never Smoker  . Smokeless tobacco: Never Used  Substance Use Topics  . Alcohol use: Yes    Comment: occasional   .  Drug use: No     Family Hx: The patient's family history includes Cancer in his brother and father; Hypertension in his mother; Hypothyroidism in his mother and sister; Kidney failure in his father. There is no history of Heart disease, Colon cancer, Esophageal cancer, Stomach cancer, or Rectal cancer.   Prior CV studies:   The following studies were reviewed today: . none:  Labs/Other Tests and Data Reviewed:    EKG:  No ECG reviewed.  Recent Labs: 04/24/2019: TSH 2.48   Recent Lipid Panel Lab Results  Component Value Date/Time   CHOL 207 (H) 04/25/2018 09:18 AM   TRIG 150.0 (H) 04/25/2018 09:18 AM   HDL 41.30 04/25/2018 09:18 AM   CHOLHDL 5 04/25/2018 09:18 AM   LDLCALC 135 (H) 04/25/2018 09:18 AM    Wt Readings from Last 3 Encounters:  05/07/19 222 lb (100.7 kg)  04/24/19 225 lb (102.1 kg)  03/20/19 224 lb (101.6 kg)   -- had been down to 216 - gained some with knee injury  Objective:    Vital  Signs:  BP 138/66 (BP Location: Right Arm)   Pulse 72   Ht 6\' 2"  (1.88 m)   Wt 222 lb (100.7 kg)   BMI 28.50 kg/m   VITAL SIGNS:  reviewed GEN:  Well nourished, well developed male in no acute distress. RESPIRATORY:  normal respiratory effort, symmetric expansion CARDIOVASCULAR:  no peripheral edema NEURO:  alert and oriented x 3, no obvious focal deficit PSYCH:  normal affect   ASSESSMENT & PLAN:    Problem List Items Addressed This Visit    Essential hypertension - Primary (Chronic)   Hyperlipidemia with target LDL less than 100 (Chronic)   Palpitations (Chronic)     Doing very well.    Palpitations are pretty well controlled.  No longer on beta-blocker which is beneficial with him being asthmatic.  Blood pressure is starting to creep up, but this may be because he has been less active.  Would like to continue to monitor since he is already on max dose of all of his current medications.  PCP is managing his lipids.  Plan is to reassess in the summer with a  potential starting statin.  In order to potentially avoid side effects, would probably consider starting rosuvastatin at 10 or 20 mg.   COVID-19 Education: The signs and symptoms of COVID-19 were discussed with the patient and how to seek care for testing (follow up with PCP or arrange E-visit).   The importance of social distancing was discussed today.  Time:   Today, I have spent 16 minutes with the patient with telehealth technology discussing the above problems.     Medication Adjustments/Labs and Tests Ordered: Current medicines are reviewed at length with the patient today.  Concerns regarding medicines are outlined above.  Medication Instructions:    Tests Ordered: No orders of the defined types were placed in this encounter.   Medication Changes: No orders of the defined types were placed in this encounter.   Disposition:  Follow up in 1 year(s)    Signed, Glenetta Hew, MD  05/07/2019 8:13 AM    [place]

## 2019-05-07 ENCOUNTER — Telehealth (INDEPENDENT_AMBULATORY_CARE_PROVIDER_SITE_OTHER): Payer: Medicare HMO | Admitting: Cardiology

## 2019-05-07 ENCOUNTER — Telehealth: Payer: Self-pay | Admitting: *Deleted

## 2019-05-07 ENCOUNTER — Encounter: Payer: Self-pay | Admitting: Cardiology

## 2019-05-07 VITALS — BP 138/66 | HR 72 | Ht 74.0 in | Wt 222.0 lb

## 2019-05-07 DIAGNOSIS — R002 Palpitations: Secondary | ICD-10-CM

## 2019-05-07 DIAGNOSIS — I10 Essential (primary) hypertension: Secondary | ICD-10-CM

## 2019-05-07 DIAGNOSIS — E785 Hyperlipidemia, unspecified: Secondary | ICD-10-CM

## 2019-05-07 NOTE — Patient Instructions (Addendum)
Medication Instructions:   None for now, will await results of cholesterol rechecked with PCP.  If you need a refill on your cardiac medications before your next appointment, please call your pharmacy.   Lab work: None  If you have labs (blood work) drawn today and your tests are completely normal, you will receive your results only by: Marland Kitchen MyChart Message (if you have MyChart) OR . A paper copy in the mail If you have any lab test that is abnormal or we need to change your treatment, we will call you to review the results.  Testing/Procedures: None  Follow-Up: At [facility], you and your health needs are our priority.  As part of our continuing mission to provide you with exceptional heart care, we have created designated Provider Care Teams.  These Care Teams include your primary Cardiologist (physician) and Advanced Practice Providers (APPs -  Physician Assistants and Nurse Practitioners) who all work together to provide you with the care you need, when you need it. . You will need a follow up appointment in    12 months-MAY 2021.  Please call our office 2 months in advance to schedule this appointment.  You may see Glenetta Hew, MD or one of the following Advanced Practice Providers on your designated Care Team:   . Rosaria Ferries, PA-C . Jory Sims, DNP, ANP  Any Other Special Instructions Will Be Listed Below (If Applicable).

## 2019-05-07 NOTE — Telephone Encounter (Signed)
SPOKE to patient - instruction given from tele- visit 5/11. avs summary will be sent via mychart. Patient verbalized understanding

## 2019-05-22 ENCOUNTER — Other Ambulatory Visit: Payer: Self-pay | Admitting: Family Medicine

## 2019-05-22 DIAGNOSIS — J309 Allergic rhinitis, unspecified: Secondary | ICD-10-CM

## 2019-06-20 ENCOUNTER — Other Ambulatory Visit: Payer: Self-pay | Admitting: Cardiology

## 2019-06-28 NOTE — Telephone Encounter (Signed)
Open n error °

## 2019-08-08 ENCOUNTER — Inpatient Hospital Stay (HOSPITAL_COMMUNITY)
Admission: EM | Admit: 2019-08-08 | Discharge: 2019-08-10 | DRG: 394 | Disposition: A | Discharge status: Home / Self Care | Payer: Medicare HMO | Attending: Internal Medicine | Admitting: Internal Medicine

## 2019-08-08 ENCOUNTER — Encounter (HOSPITAL_COMMUNITY): Payer: Self-pay | Admitting: Emergency Medicine

## 2019-08-08 ENCOUNTER — Other Ambulatory Visit: Payer: Self-pay

## 2019-08-08 ENCOUNTER — Emergency Department (HOSPITAL_COMMUNITY): Payer: Medicare HMO

## 2019-08-08 DIAGNOSIS — K4090 Unilateral inguinal hernia, without obstruction or gangrene, not specified as recurrent: Secondary | ICD-10-CM | POA: Diagnosis not present

## 2019-08-08 DIAGNOSIS — K529 Noninfective gastroenteritis and colitis, unspecified: Secondary | ICD-10-CM

## 2019-08-08 DIAGNOSIS — D6851 Activated protein C resistance: Secondary | ICD-10-CM | POA: Diagnosis present

## 2019-08-08 DIAGNOSIS — Z7901 Long term (current) use of anticoagulants: Secondary | ICD-10-CM

## 2019-08-08 DIAGNOSIS — E785 Hyperlipidemia, unspecified: Secondary | ICD-10-CM | POA: Diagnosis present

## 2019-08-08 DIAGNOSIS — R110 Nausea: Secondary | ICD-10-CM | POA: Diagnosis not present

## 2019-08-08 DIAGNOSIS — I700 Atherosclerosis of aorta: Secondary | ICD-10-CM | POA: Diagnosis not present

## 2019-08-08 DIAGNOSIS — R109 Unspecified abdominal pain: Secondary | ICD-10-CM

## 2019-08-08 DIAGNOSIS — K55039 Acute (reversible) ischemia of large intestine, extent unspecified: Secondary | ICD-10-CM | POA: Diagnosis not present

## 2019-08-08 DIAGNOSIS — Z8249 Family history of ischemic heart disease and other diseases of the circulatory system: Secondary | ICD-10-CM

## 2019-08-08 DIAGNOSIS — J309 Allergic rhinitis, unspecified: Secondary | ICD-10-CM | POA: Diagnosis present

## 2019-08-08 DIAGNOSIS — Z7951 Long term (current) use of inhaled steroids: Secondary | ICD-10-CM

## 2019-08-08 DIAGNOSIS — Z79899 Other long term (current) drug therapy: Secondary | ICD-10-CM

## 2019-08-08 DIAGNOSIS — K429 Umbilical hernia without obstruction or gangrene: Secondary | ICD-10-CM | POA: Diagnosis not present

## 2019-08-08 DIAGNOSIS — R739 Hyperglycemia, unspecified: Secondary | ICD-10-CM | POA: Diagnosis present

## 2019-08-08 DIAGNOSIS — K922 Gastrointestinal hemorrhage, unspecified: Secondary | ICD-10-CM | POA: Diagnosis not present

## 2019-08-08 DIAGNOSIS — R42 Dizziness and giddiness: Secondary | ICD-10-CM | POA: Diagnosis not present

## 2019-08-08 DIAGNOSIS — Z86718 Personal history of other venous thrombosis and embolism: Secondary | ICD-10-CM

## 2019-08-08 DIAGNOSIS — R202 Paresthesia of skin: Secondary | ICD-10-CM | POA: Diagnosis not present

## 2019-08-08 DIAGNOSIS — K559 Vascular disorder of intestine, unspecified: Secondary | ICD-10-CM

## 2019-08-08 DIAGNOSIS — Z888 Allergy status to other drugs, medicaments and biological substances status: Secondary | ICD-10-CM

## 2019-08-08 DIAGNOSIS — E876 Hypokalemia: Secondary | ICD-10-CM | POA: Diagnosis present

## 2019-08-08 DIAGNOSIS — Z20828 Contact with and (suspected) exposure to other viral communicable diseases: Secondary | ICD-10-CM | POA: Diagnosis present

## 2019-08-08 DIAGNOSIS — R52 Pain, unspecified: Secondary | ICD-10-CM | POA: Diagnosis not present

## 2019-08-08 DIAGNOSIS — N281 Cyst of kidney, acquired: Secondary | ICD-10-CM | POA: Diagnosis not present

## 2019-08-08 DIAGNOSIS — I10 Essential (primary) hypertension: Secondary | ICD-10-CM | POA: Diagnosis present

## 2019-08-08 DIAGNOSIS — Z86711 Personal history of pulmonary embolism: Secondary | ICD-10-CM

## 2019-08-08 DIAGNOSIS — J4520 Mild intermittent asthma, uncomplicated: Secondary | ICD-10-CM | POA: Diagnosis present

## 2019-08-08 DIAGNOSIS — R1084 Generalized abdominal pain: Secondary | ICD-10-CM | POA: Diagnosis not present

## 2019-08-08 DIAGNOSIS — E0500 Thyrotoxicosis with diffuse goiter without thyrotoxic crisis or storm: Secondary | ICD-10-CM | POA: Diagnosis present

## 2019-08-08 DIAGNOSIS — Z03818 Encounter for observation for suspected exposure to other biological agents ruled out: Secondary | ICD-10-CM | POA: Diagnosis not present

## 2019-08-08 HISTORY — DX: Personal history of colonic polyps: Z86.010

## 2019-08-08 HISTORY — DX: Personal history of adenomatous and serrated colon polyps: Z86.0101

## 2019-08-08 LAB — URINALYSIS, ROUTINE W REFLEX MICROSCOPIC
Bilirubin Urine: NEGATIVE
Glucose, UA: NEGATIVE mg/dL
Hgb urine dipstick: NEGATIVE
Ketones, ur: NEGATIVE mg/dL
Leukocytes,Ua: NEGATIVE
Nitrite: NEGATIVE
Protein, ur: NEGATIVE mg/dL
Specific Gravity, Urine: 1.013 (ref 1.005–1.030)
pH: 7 (ref 5.0–8.0)

## 2019-08-08 LAB — HEMOGLOBIN A1C
Hgb A1c MFr Bld: 5.5 % (ref 4.8–5.6)
Mean Plasma Glucose: 111.15 mg/dL

## 2019-08-08 LAB — COMPREHENSIVE METABOLIC PANEL
ALT: 43 U/L (ref 0–44)
AST: 38 U/L (ref 15–41)
Albumin: 4.5 g/dL (ref 3.5–5.0)
Alkaline Phosphatase: 50 U/L (ref 38–126)
Anion gap: 14 (ref 5–15)
BUN: 25 mg/dL — ABNORMAL HIGH (ref 8–23)
CO2: 24 mmol/L (ref 22–32)
Calcium: 9.8 mg/dL (ref 8.9–10.3)
Chloride: 102 mmol/L (ref 98–111)
Creatinine, Ser: 1.03 mg/dL (ref 0.61–1.24)
GFR calc Af Amer: >60 mL/min (ref >60)
GFR calc non Af Amer: >60 mL/min (ref >60)
Glucose, Bld: 130 mg/dL — ABNORMAL HIGH (ref 70–99)
Potassium: 2.8 mmol/L — ABNORMAL LOW (ref 3.5–5.1)
Sodium: 140 mmol/L (ref 135–145)
Total Bilirubin: 0.9 mg/dL (ref 0.3–1.2)
Total Protein: 7.5 g/dL (ref 6.5–8.1)

## 2019-08-08 LAB — CBC
HCT: 48.3 % (ref 39.0–52.0)
Hemoglobin: 16.6 g/dL (ref 13.0–17.0)
MCH: 30.5 pg (ref 26.0–34.0)
MCHC: 34.4 g/dL (ref 30.0–36.0)
MCV: 88.8 fL (ref 80.0–100.0)
Platelets: 358 10*3/uL (ref 150–400)
RBC: 5.44 MIL/uL (ref 4.22–5.81)
RDW: 13.6 % (ref 11.5–15.5)
WBC: 15.9 10*3/uL — ABNORMAL HIGH (ref 4.0–10.5)
nRBC: 0 % (ref 0.0–0.2)

## 2019-08-08 LAB — MAGNESIUM: Magnesium: 2.2 mg/dL (ref 1.7–2.4)

## 2019-08-08 LAB — HEMATOCRIT: HCT: 46.2 % (ref 39.0–52.0)

## 2019-08-08 LAB — HEMOGLOBIN: Hemoglobin: 15.2 g/dL (ref 13.0–17.0)

## 2019-08-08 LAB — LIPASE, BLOOD: Lipase: 29 U/L (ref 11–51)

## 2019-08-08 LAB — HEMOGLOBIN AND HEMATOCRIT, BLOOD
HCT: 46.2 % (ref 39.0–52.0)
HCT: 48.3 % (ref 39.0–52.0)
Hemoglobin: 15.5 g/dL (ref 13.0–17.0)
Hemoglobin: 16.8 g/dL (ref 13.0–17.0)

## 2019-08-08 LAB — ABO/RH: ABO/RH(D): A POS

## 2019-08-08 LAB — TYPE AND SCREEN
ABO/RH(D): A POS
Antibody Screen: NEGATIVE

## 2019-08-08 LAB — SARS CORONAVIRUS 2 BY RT PCR (HOSPITAL ORDER, PERFORMED IN ~~LOC~~ HOSPITAL LAB): SARS Coronavirus 2: NEGATIVE

## 2019-08-08 MED ORDER — METRONIDAZOLE IN NACL 5-0.79 MG/ML-% IV SOLN
500.0000 mg | Freq: Once | INTRAVENOUS | Status: AC
  Administered 2019-08-08: 500 mg via INTRAVENOUS
  Filled 2019-08-08: qty 100

## 2019-08-08 MED ORDER — CIPROFLOXACIN IN D5W 400 MG/200ML IV SOLN
400.0000 mg | Freq: Once | INTRAVENOUS | Status: AC
  Administered 2019-08-08: 400 mg via INTRAVENOUS
  Filled 2019-08-08: qty 200

## 2019-08-08 MED ORDER — MORPHINE SULFATE (PF) 2 MG/ML IV SOLN
2.0000 mg | INTRAVENOUS | Status: DC | PRN
  Administered 2019-08-08 – 2019-08-09 (×3): 2 mg via INTRAVENOUS
  Filled 2019-08-08 (×3): qty 1

## 2019-08-08 MED ORDER — PANTOPRAZOLE SODIUM 40 MG IV SOLR
40.0000 mg | Freq: Two times a day (BID) | INTRAVENOUS | Status: DC
  Administered 2019-08-08 – 2019-08-10 (×5): 40 mg via INTRAVENOUS
  Filled 2019-08-08 (×5): qty 40

## 2019-08-08 MED ORDER — ONDANSETRON HCL 4 MG/2ML IJ SOLN
4.0000 mg | Freq: Once | INTRAMUSCULAR | Status: DC | PRN
  Filled 2019-08-08 (×2): qty 2

## 2019-08-08 MED ORDER — ONDANSETRON HCL 4 MG PO TABS
4.0000 mg | ORAL_TABLET | Freq: Four times a day (QID) | ORAL | Status: DC | PRN
  Administered 2019-08-08: 4 mg via ORAL

## 2019-08-08 MED ORDER — OXYCODONE HCL 5 MG PO TABS: 5.0000 mg | ORAL_TABLET | ORAL | Status: DC | PRN

## 2019-08-08 MED ORDER — ONDANSETRON HCL 4 MG/2ML IJ SOLN
4.0000 mg | Freq: Once | INTRAMUSCULAR | Status: AC
  Administered 2019-08-08: 4 mg via INTRAVENOUS

## 2019-08-08 MED ORDER — AMLODIPINE BESYLATE 10 MG PO TABS
10.0000 mg | ORAL_TABLET | Freq: Every day | ORAL | Status: DC
  Administered 2019-08-08 – 2019-08-10 (×3): 10 mg via ORAL
  Filled 2019-08-08: qty 1
  Filled 2019-08-08: qty 2
  Filled 2019-08-08: qty 1

## 2019-08-08 MED ORDER — ONDANSETRON HCL 4 MG/2ML IJ SOLN
4.0000 mg | Freq: Four times a day (QID) | INTRAMUSCULAR | Status: DC | PRN
  Administered 2019-08-08 – 2019-08-09 (×3): 4 mg via INTRAVENOUS
  Filled 2019-08-08 (×2): qty 2

## 2019-08-08 MED ORDER — LACTATED RINGERS IV SOLN
INTRAVENOUS | Status: AC
  Administered 2019-08-08 – 2019-08-09 (×3): via INTRAVENOUS

## 2019-08-08 MED ORDER — SODIUM CHLORIDE (PF) 0.9 % IJ SOLN
INTRAMUSCULAR | Status: AC
  Filled 2019-08-08: qty 50

## 2019-08-08 MED ORDER — POLYETHYLENE GLYCOL 3350 17 G PO PACK
17.0000 g | PACK | Freq: Two times a day (BID) | ORAL | Status: DC
  Administered 2019-08-08 – 2019-08-09 (×4): 17 g via ORAL
  Filled 2019-08-08 (×5): qty 1

## 2019-08-08 MED ORDER — METHIMAZOLE 5 MG PO TABS
5.0000 mg | ORAL_TABLET | Freq: Every day | ORAL | Status: DC
  Administered 2019-08-08 – 2019-08-10 (×3): 5 mg via ORAL
  Filled 2019-08-08 (×3): qty 1

## 2019-08-08 MED ORDER — BUDESONIDE 0.25 MG/2ML IN SUSP
0.2500 mg | Freq: Two times a day (BID) | RESPIRATORY_TRACT | Status: DC
  Administered 2019-08-08 – 2019-08-10 (×4): 0.25 mg via RESPIRATORY_TRACT
  Filled 2019-08-08 (×4): qty 2

## 2019-08-08 MED ORDER — ACETAMINOPHEN 650 MG RE SUPP: 650.0000 mg | Freq: Four times a day (QID) | RECTAL | Status: DC | PRN

## 2019-08-08 MED ORDER — ALBUTEROL SULFATE HFA 108 (90 BASE) MCG/ACT IN AERS
2.0000 | INHALATION_SPRAY | RESPIRATORY_TRACT | Status: DC | PRN
  Filled 2019-08-08: qty 6.7

## 2019-08-08 MED ORDER — POTASSIUM CHLORIDE 10 MEQ/100ML IV SOLN
10.0000 meq | Freq: Once | INTRAVENOUS | Status: AC
  Administered 2019-08-08: 10 meq via INTRAVENOUS
  Filled 2019-08-08: qty 100

## 2019-08-08 MED ORDER — SODIUM CHLORIDE 0.9% FLUSH
3.0000 mL | Freq: Once | INTRAVENOUS | Status: AC
  Administered 2019-08-08: 3 mL via INTRAVENOUS

## 2019-08-08 MED ORDER — IOHEXOL 300 MG/ML  SOLN
100.0000 mL | Freq: Once | INTRAMUSCULAR | Status: AC | PRN
  Administered 2019-08-08: 100 mL via INTRAVENOUS

## 2019-08-08 MED ORDER — SODIUM CHLORIDE 0.9 % IV SOLN
2.0000 g | INTRAVENOUS | Status: DC
  Administered 2019-08-08 – 2019-08-09 (×2): 2 g via INTRAVENOUS
  Filled 2019-08-08 (×2): qty 2
  Filled 2019-08-08: qty 20

## 2019-08-08 MED ORDER — SODIUM CHLORIDE 0.9 % IV BOLUS
1000.0000 mL | Freq: Once | INTRAVENOUS | Status: AC
  Administered 2019-08-08: 1000 mL via INTRAVENOUS

## 2019-08-08 MED ORDER — METRONIDAZOLE IN NACL 5-0.79 MG/ML-% IV SOLN
500.0000 mg | Freq: Three times a day (TID) | INTRAVENOUS | Status: DC
  Administered 2019-08-08 – 2019-08-10 (×6): 500 mg via INTRAVENOUS
  Filled 2019-08-08 (×6): qty 100

## 2019-08-08 MED ORDER — ALBUTEROL SULFATE (2.5 MG/3ML) 0.083% IN NEBU: 2.5000 mg | INHALATION_SOLUTION | RESPIRATORY_TRACT | Status: DC | PRN

## 2019-08-08 MED ORDER — FLUTICASONE PROPIONATE HFA 110 MCG/ACT IN AERO: 2.0000 | INHALATION_SPRAY | Freq: Two times a day (BID) | RESPIRATORY_TRACT | Status: DC

## 2019-08-08 MED ORDER — POTASSIUM CHLORIDE CRYS ER 20 MEQ PO TBCR
40.0000 meq | EXTENDED_RELEASE_TABLET | ORAL | Status: AC
  Administered 2019-08-08: 40 meq via ORAL
  Filled 2019-08-08: qty 2

## 2019-08-08 MED ORDER — MORPHINE SULFATE (PF) 4 MG/ML IV SOLN
4.0000 mg | Freq: Once | INTRAVENOUS | Status: AC
  Administered 2019-08-08: 4 mg via INTRAVENOUS
  Filled 2019-08-08: qty 1

## 2019-08-08 MED ORDER — ACETAMINOPHEN 325 MG PO TABS: 650.0000 mg | ORAL_TABLET | Freq: Four times a day (QID) | ORAL | Status: DC | PRN

## 2019-08-08 MED ORDER — RAMIPRIL 10 MG PO CAPS
10.0000 mg | ORAL_CAPSULE | Freq: Every day | ORAL | Status: DC
  Filled 2019-08-08: qty 1

## 2019-08-08 NOTE — ED Notes (Signed)
Urine and culture sent to lab  

## 2019-08-08 NOTE — ED Notes (Signed)
Patient transported to CT 

## 2019-08-08 NOTE — ED Notes (Signed)
Pt back from radiology 

## 2019-08-08 NOTE — Consult Note (Addendum)
Referring Provider:  Dr. Dina Rich, EDP Primary Care Physician:  Caren Macadam, MD Primary Gastroenterologist:  Dr. Henrene Pastor  Reason for Consultation:  Abdominal pain, GI bleed  HPI: Matthew Trujillo is a 68 y.o. male with PMH of HTN, asthma, and Factor 5 Leiden with history of PE on Eliquis who presented to [facility] ED with complaints of sudden onset abdominal pain/cramping.  He says that it woke him from sleep around 1 AM.  Felt like he had to have a BM but did not pass anything.  Pain intensified so he called EMS.  Once he got here to the ED he started passing moderate to large amounts of blood per rectum.  Passing clots.  Complaining of nausea as well.   No history of similar episodes.  WBC count 15.9 and K+ 2.8.  BUN slightly elevated at 25.  Hgb normal for now but I expect that will drop.  CT scan of the abdomen and pelvis with contrast showed the following:  IMPRESSION: 1. No acute finding. 2. Stool is present throughout most of the colon. 3. Umbilical and right inguinal fatty hernias.  Is on abx.   Colonoscopy by Dr. Henrene Pastor 10/2018 showed a 4 mm polyp that was performed, but was otherwise unremarkable.     Past Medical History:  Diagnosis Date   ALLERGIC RHINITIS 05/21/2010   ASTHMA 05/21/2010   Asthma    Factor V Leiden ([place])    HYPERTENSION 05/21/2010   Diffiult to control   PULMONARY EMBOLISM 05/21/2010    Past Surgical History:  Procedure Laterality Date   ACHILLES TENDON REPAIR     DOPPLER ECHOCARDIOGRAPHY  12/30/2011   EF >55%; MILD LVH,MILD LAE,NORMAL DIASTOLIC FUNCTION   RENAL DOPPLER  12/30/2011   NORMAL RENAL    Prior to Admission medications   Medication Sig Start Date End Date Taking? Authorizing Provider  albuterol (PROVENTIL HFA;VENTOLIN HFA) 108 (90 Base) MCG/ACT inhaler INHALE 2 PUFFS EVERY 4 HOURS AS NEEDED WHEEZING Patient taking differently: Inhale 2 puffs into the lungs every 4 (four) hours as needed for wheezing.  09/22/18  Yes Marletta Lor, MD  albuterol (PROVENTIL) (2.5 MG/3ML) 0.083% nebulizer solution Take 3 mLs (2.5 mg total) by nebulization every 6 (six) hours as needed for wheezing or shortness of breath. 12/08/18  Yes Koberlein, Junell C, MD  amLODipine (NORVASC) 10 MG tablet TAKE 10 mg TABLET BY MOUTH EVERY DAY Patient taking differently: Take 10 mg by mouth daily.  09/21/18  Yes Leonie Man, MD  Calcium Carbonate-Vitamin D (CALTRATE 600+D) 600-400 MG-UNIT per tablet Take 1 tablet by mouth 2 (two) times daily.    Yes [provider]  chlorthalidone (HYGROTON) 50 MG tablet TAKE 1 TABLET BY MOUTH EVERY DAY Patient taking differently: Take 50 mg by mouth daily.  09/21/18  Yes Leonie Man, MD  Cholecalciferol (VITAMIN D-3) 5000 units TABS Take 5,000 Units by mouth daily.   Yes [provider]  ELIQUIS 5 MG TABS tablet TAKE 1 TABLET BY MOUTH TWICE A DAY Patient taking differently: Take 5 mg by mouth 2 (two) times daily.  10/12/18  Yes Dorena Cookey, MD  fluticasone (FLONASE) 50 MCG/ACT nasal spray Place 1 spray into both nostrils daily. Patient taking differently: Place 1 spray into both nostrils daily as needed for allergies.  05/25/19  Yes Koberlein, Junell C, MD  fluticasone (FLOVENT HFA) 110 MCG/ACT inhaler Inhale 2 puffs into the lungs 2 (two) times daily. 12/08/18  Yes Caren Macadam, MD  methimazole (TAPAZOLE) 5 MG tablet TAKE 1 TABLET BY MOUTH EVERY DAY Patient taking differently: Take 5 mg by mouth daily.  04/30/19  Yes Renato Shin, MD  Multiple Vitamin (MULTIVITAMIN) tablet Take 1 tablet by mouth daily.     Yes [provider]  Omega-3 Fatty Acids (FISH OIL) 1000 MG CAPS Take by mouth 2 (two) times daily.    Yes [provider]  ramipril (ALTACE) 10 MG capsule TAKE 1 CAPSULE BY MOUTH EVERY DAY Patient taking differently: Take 10 mg by mouth daily.  06/20/19  Yes Leonie Man, MD  Spacer/Aero-Holding Chambers (E-Z SPACER) inhaler Use as instructed 08/16/18    Caren Macadam, MD    Current Facility-Administered Medications  Medication Dose Route Frequency Provider Last Rate Last Dose   0.9 %  sodium chloride infusion  500 mL Intravenous Once Irene Shipper, MD       acetaminophen (TYLENOL) tablet 650 mg  650 mg Oral Q6H PRN Elodia Florence., MD       Or   acetaminophen (TYLENOL) suppository 650 mg  650 mg Rectal Q6H PRN Elodia Florence., MD       albuterol (VENTOLIN HFA) 108 (90 Base) MCG/ACT inhaler 2 puff  2 puff Inhalation Q4H PRN Elodia Florence., MD       amLODipine (NORVASC) tablet 10 mg  10 mg Oral Daily Elodia Florence., MD       budesonide (PULMICORT) nebulizer solution 0.25 mg  0.25 mg Nebulization BID Elodia Florence., MD       cefTRIAXone (ROCEPHIN) 2 g in sodium chloride 0.9 % 100 mL IVPB  2 g Intravenous Q24H Elodia Florence., MD       ipratropium (ATROVENT) nebulizer solution 0.5 mg  0.5 mg Nebulization Once Roselee Culver, MD       lactated ringers infusion   Intravenous Continuous Elodia Florence., MD 100 mL/hr at 08/08/19 0813     methimazole (TAPAZOLE) tablet 5 mg  5 mg Oral Daily Elodia Florence., MD       metroNIDAZOLE (FLAGYL) IVPB 500 mg  500 mg Intravenous Q8H Elodia Florence., MD       morphine 2 MG/ML injection 2 mg  2 mg Intravenous Q4H PRN Elodia Florence., MD       ondansetron [facility]) injection 4 mg  4 mg Intravenous Once PRN Elodia Florence., MD       ondansetron [facility]) tablet 4 mg  4 mg Oral Q6H PRN Elodia Florence., MD       Or   ondansetron [facility]) injection 4 mg  4 mg Intravenous Q6H PRN Elodia Florence., MD       oxyCODONE (Oxy IR/ROXICODONE) immediate release tablet 5 mg  5 mg Oral Q4H PRN Elodia Florence., MD       polyethylene glycol (MIRALAX / GLYCOLAX) packet 17 g  17 g Oral BID Elodia Florence., MD       potassium chloride SA (K-DUR) CR tablet 40 mEq  40 mEq Oral Q4H Elodia Florence., MD       Current Outpatient Medications  Medication Sig Dispense Refill   albuterol (PROVENTIL HFA;VENTOLIN HFA) 108 (90 Base) MCG/ACT inhaler INHALE 2 PUFFS EVERY 4 HOURS AS NEEDED WHEEZING (Patient taking differently: Inhale 2 puffs into the lungs every 4 (four) hours as needed for wheezing. ) 8.5 Inhaler 2  albuterol (PROVENTIL) (2.5 MG/3ML) 0.083% nebulizer solution Take 3 mLs (2.5 mg total) by nebulization every 6 (six) hours as needed for wheezing or shortness of breath. 75 mL 12   amLODipine (NORVASC) 10 MG tablet TAKE 10 mg TABLET BY MOUTH EVERY DAY (Patient taking differently: Take 10 mg by mouth daily. ) 90 tablet 3   Calcium Carbonate-Vitamin D (CALTRATE 600+D) 600-400 MG-UNIT per tablet Take 1 tablet by mouth 2 (two) times daily.      chlorthalidone (HYGROTON) 50 MG tablet TAKE 1 TABLET BY MOUTH EVERY DAY (Patient taking differently: Take 50 mg by mouth daily. ) 90 tablet 3   Cholecalciferol (VITAMIN D-3) 5000 units TABS Take 5,000 Units by mouth daily.     ELIQUIS 5 MG TABS tablet TAKE 1 TABLET BY MOUTH TWICE A DAY (Patient taking differently: Take 5 mg by mouth 2 (two) times daily. ) 180 tablet 3   fluticasone (FLONASE) 50 MCG/ACT nasal spray Place 1 spray into both nostrils daily. (Patient taking differently: Place 1 spray into both nostrils daily as needed for allergies. ) 16 g 5   fluticasone (FLOVENT HFA) 110 MCG/ACT inhaler Inhale 2 puffs into the lungs 2 (two) times daily. 1 Inhaler 12   methimazole (TAPAZOLE) 5 MG tablet TAKE 1 TABLET BY MOUTH EVERY DAY (Patient taking differently: Take 5 mg by mouth daily. ) 90 tablet 1   Multiple Vitamin (MULTIVITAMIN) tablet Take 1 tablet by mouth daily.       Omega-3 Fatty Acids (FISH OIL) 1000 MG CAPS Take by mouth 2 (two) times daily.      ramipril (ALTACE) 10 MG capsule TAKE 1 CAPSULE BY MOUTH EVERY DAY (Patient taking differently: Take 10 mg by mouth daily. ) 90 capsule 3   Spacer/Aero-Holding Chambers (E-Z SPACER)  inhaler Use as instructed 1 each 2    Allergies as of 08/08/2019 - Review Complete 08/08/2019  Allergen Reaction Noted   Celebrex [celecoxib]  02/26/2019    Family History  Problem Relation Age of Onset   Cancer Father    Kidney failure Father        single kidney; hx of recurrent infections   Cancer Brother    Hypertension Mother    Hypothyroidism Mother    Hypothyroidism Sister    Heart disease Neg Hx    Colon cancer Neg Hx    Esophageal cancer Neg Hx    Stomach cancer Neg Hx    Rectal cancer Neg Hx     Social History   Social History Narrative   He is a married father of 2. [employer] employee   He does exercise about 3-4 days a week with no true symptoms.    Does not smoke and occasional social alcohol.      Review of Systems: ROS is O/W negative except as mentioned in HPI.  Physical Exam: Vital signs in last 24 hours: Temp:  [97.6 F (36.4 C)] 97.6 F (36.4 C) (08/12 0253) Pulse Rate:  [67-72] 67 (08/12 0755) Resp:  [12-20] 14 (08/12 0755) BP: (143-164)/(61-83) 143/71 (08/12 0643) SpO2:  [97 %-100 %] 99 % (08/12 0755)   General:  Alert, Well-developed, well-nourished, pleasant and cooperative in NAD Head:  Normocephalic and atraumatic. Eyes:  Sclera clear, no icterus.  Conjunctiva pink. Ears:  Normal auditory acuity. Mouth:  No deformity or lesions.   Lungs:  Clear throughout to auscultation.  No wheezes, crackles, or rhonchi.  Heart:  Regular rate and rhythm; no murmurs, clicks, rubs, or gallops. Abdomen:  Soft,  non-distended.  BS present.  Mild TTP diffuse, mostly over umbilical hernia although it is reducible. Msk:  Symmetrical without gross deformities. Pulses:  Normal pulses noted. Extremities:  Without clubbing or edema. Neurologic:  Alert and oriented x 4;  grossly normal neurologically. Skin:  Intact without significant lesions or rashes. Psych:  Alert and cooperative. Normal mood and affect.  Intake/Output from previous day: 08/11  0701 - 08/12 0700 In: 98.1 [IV Piggyback:98.1] Out: -   Lab Results: Recent Labs    08/08/19 0334 08/08/19 0554 08/08/19 0800  WBC 15.9*  --   --   HGB 16.6 15.5 16.8  HCT 48.3 46.2 48.3  PLT 358  --   --    BMET Recent Labs    08/08/19 0334  NA 140  K 2.8*  CL 102  CO2 24  GLUCOSE 130*  BUN 25*  CREATININE 1.03  CALCIUM 9.8   LFT Recent Labs    08/08/19 0334  PROT 7.5  ALBUMIN 4.5  AST 38  ALT 43  ALKPHOS 50  BILITOT 0.9   Studies/Results: Ct Abdomen Pelvis W Contrast  Result Date: 08/08/2019 CLINICAL DATA:  Severe abdominal cramping. EXAM: CT ABDOMEN AND PELVIS WITH CONTRAST TECHNIQUE: Multidetector CT imaging of the abdomen and pelvis was performed using the standard protocol following bolus administration of intravenous contrast. CONTRAST:  175mL OMNIPAQUE IOHEXOL 300 MG/ML  SOLN COMPARISON:  None. 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 Pancreas: Unremarkable. Spleen: Unremarkable. Adrenals/Urinary Tract: Negative adrenals. No hydronephrosis or stone. Right pelviectasis on portal venous phase becomes more symmetric on delayed phase. No evidence of underlying obstructive process. Small right renal cystic density. Unremarkable bladder. Stomach/Bowel: No obstruction. Stool seen throughout most of the colon without wall thickening or rectal over distension. Minimal small bowel mesenteric fat haziness, likely incidental in isolation. The appendix is not seen. No pericecal inflammation. Vascular/Lymphatic: No acute vascular abnormality. Atherosclerotic plaque. No mass or adenopathy. Reproductive:Negative Other: No ascites or pneumoperitoneum. Fatty periumbilical and right inguinal hernias. Musculoskeletal: No acute abnormalities. IMPRESSION: 1. No acute finding. 2. Stool is present throughout most of the colon. 3. Umbilical and right inguinal fatty hernias.  Electronically Signed   By: Monte Fantasia M.D.   On: 08/08/2019 05:22   IMPRESSION:  *68 year old male with sudden onset severe crampy abdominal pain followed by bloody stool.  CT scan negative but suspect this is ischemic colitis.  Quite possible that it was too early or mild for CT scan to detect.  Has leukocytosis.  Hgb normal for now but I expect that to drop. *Hypokalemia:  K+ 2.8.  Replacement per primary service. *Leiden Factor 5 with history of PE on Eliquis:  Now on hold.  PLAN: *Would treat conservatively/with supportive care.  IVF's, pain control, antiemetics.  Jeddito for clears if desires.  Antibiotic coverage not unreasonable for now. *Monitor Hgb and transfuse prn. *Would not plan to scope or provide further imaging unless he fails to improve or worsens.  Would then start with repeat CT scan at that point.  Laban Emperor. Zehr  08/08/2019, 8:58 AM     [facility] Attending   I have taken an interval history, reviewed the chart and examined the patient. I agree with the Advanced Practitioner's note, impression and recommendations.     Clinical scenario very consistent with non-occlusive ischemic colitis - I think so early ion course that CT might not show anything due  to that. He may bleed more than typical since on eliquis Agree with supportive measures and would reserve sigmoidoscopy or possible colonoscopy for deterioration or clinical uncertainty. We will follow.   Gatha Mayer, MD, [facility] Gastroenterology 08/08/2019 12:56 PM Pager [phone]

## 2019-08-08 NOTE — ED Provider Notes (Signed)
[facility] DEPT Provider Note   CSN: [id number] Arrival date & time: 08/08/19  0240    History   Chief Complaint Chief Complaint  Patient presents with   Abdominal Pain    HPI NASIER THUMM is a 68 y.o. male.     HPI  This is a 68 year old male with a history of hypertension and pulmonary embolism who presents with abdominal pain, nausea, vomiting.  Patient reports that he woke up from sleep with lower crampy abdominal pain.  He states that he felt like he had to go to the bathroom.  He went to the bathroom and began to feel lightheaded and diaphoretic.  He did not pass out.  He reports nausea.  He rates his pain a 10 out of 10.  He is not taking anything at home for his pain.  States that he felt well prior to going to bed.  Denies any recent fevers, cough, congestion.  Denies chest pain or shortness of breath.  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[place]) 10/26/2017   Palpitations 09/01/2017   Hyperthyroidism 08/30/2017   Bilateral edema of lower extremity 08/30/2017   Essential hypertension 05/21/2010   History of pulmonary embolism 05/21/2010   Allergic rhinitis 05/21/2010   Asthma in adult, mild intermittent, uncomplicated 12/75/1700    Past Surgical History:  Procedure Laterality Date   ACHILLES TENDON REPAIR     DOPPLER ECHOCARDIOGRAPHY  12/30/2011   EF >55%; MILD LVH,MILD LAE,NORMAL DIASTOLIC FUNCTION   RENAL DOPPLER  12/30/2011   NORMAL RENAL         Home Medications    Prior to Admission medications   Medication Sig Start Date End Date Taking? Authorizing Provider  albuterol (PROVENTIL HFA;VENTOLIN HFA) 108 (90 Base) MCG/ACT inhaler INHALE 2 PUFFS EVERY 4 HOURS AS NEEDED WHEEZING Patient taking differently: Inhale 2 puffs into the lungs every 4 (four) hours as needed for wheezing.  09/22/18  Yes Marletta Lor, MD  albuterol (PROVENTIL) (2.5 MG/3ML) 0.083% nebulizer solution Take 3 mLs (2.5 mg total) by nebulization every 6 (six) hours as needed for wheezing or shortness of breath. 12/08/18  Yes Koberlein, Junell C, MD  amLODipine (NORVASC) 10 MG tablet TAKE 10 mg TABLET BY MOUTH EVERY DAY Patient taking differently: Take 10 mg by mouth daily.  09/21/18  Yes Leonie Man, MD  Calcium Carbonate-Vitamin D (CALTRATE 600+D) 600-400 MG-UNIT per tablet Take 1 tablet by mouth 2 (two) times daily.    Yes [provider]  chlorthalidone (HYGROTON) 50 MG tablet TAKE 1 TABLET BY MOUTH EVERY DAY Patient taking differently: Take 50 mg by mouth daily.  09/21/18  Yes Leonie Man, MD  Cholecalciferol (VITAMIN D-3) 5000 units TABS Take 5,000 Units by mouth daily.   Yes [provider]  ELIQUIS 5 MG TABS tablet TAKE 1 TABLET BY MOUTH TWICE A DAY Patient taking differently: Take 5 mg by mouth 2 (two) times daily.  10/12/18  Yes Dorena Cookey, MD  fluticasone (  FLONASE) 50 MCG/ACT nasal spray Place 1 spray into both nostrils daily. Patient taking differently: Place 1 spray into both nostrils daily as needed for allergies.  05/25/19  Yes Koberlein, Junell C, MD  fluticasone (FLOVENT HFA) 110 MCG/ACT inhaler Inhale 2 puffs into the lungs 2 (two) times daily. 12/08/18  Yes Koberlein, Junell C, MD  methimazole (TAPAZOLE) 5 MG tablet TAKE 1 TABLET BY MOUTH EVERY DAY Patient taking differently: Take 5 mg by mouth daily.  04/30/19  Yes Renato Shin, MD  Multiple Vitamin (MULTIVITAMIN) tablet Take 1 tablet by mouth daily.      Yes [provider]  Omega-3 Fatty Acids (FISH OIL) 1000 MG CAPS Take by mouth 2 (two) times daily.    Yes [provider]  ramipril (ALTACE) 10 MG capsule TAKE 1 CAPSULE BY MOUTH EVERY DAY Patient taking differently: Take 10 mg by mouth daily.  06/20/19  Yes Leonie Man, MD  Spacer/Aero-Holding Chambers (E-Z SPACER) inhaler Use as instructed 08/16/18   Caren Macadam, MD    Family History Family History  Problem Relation Age of Onset   Cancer Father    Kidney failure Father        single kidney; hx of recurrent infections   Cancer Brother    Hypertension Mother    Hypothyroidism Mother    Hypothyroidism Sister    Heart disease Neg Hx    Colon cancer Neg Hx    Esophageal cancer Neg Hx    Stomach cancer Neg Hx    Rectal cancer Neg Hx     Social History Social History   Tobacco Use   Smoking status: Never Smoker   Smokeless tobacco: Never Used  Substance Use Topics   Alcohol use: Yes    Comment: occasional    Drug use: No     Allergies   Celebrex [celecoxib]   Review of Systems Review of Systems  Constitutional: Positive for diaphoresis. Negative for fever.  Respiratory: Negative for shortness of breath.   Cardiovascular: Negative for chest pain.  Gastrointestinal: Positive for abdominal pain, nausea and vomiting. Negative for constipation and diarrhea.  Genitourinary: Negative for dysuria.  Neurological: Positive for dizziness and light-headedness.  All other systems reviewed and are negative.    Physical Exam Updated Vital Signs BP (!) 143/71    Pulse 72    Temp 97.6 F (36.4 C) (Oral)    Resp 14    SpO2 97%   Physical Exam Vitals signs and nursing note reviewed.  Constitutional:      Appearance: He is well-developed.     Comments: Uncomfortable appearing but nontoxic  HENT:     Head: Normocephalic and atraumatic.  Eyes:     Pupils: Pupils are equal, round, and reactive to light.  Neck:     Musculoskeletal:  Neck supple.  Cardiovascular:     Rate and Rhythm: Normal rate and regular rhythm.     Heart sounds: Normal heart sounds. No murmur.  Pulmonary:     Effort: Pulmonary effort is normal. No respiratory distress.     Breath sounds: Normal breath sounds. No wheezing.  Abdominal:     General: Bowel sounds are normal.     Palpations: Abdomen is soft.     Tenderness: There is abdominal tenderness in the periumbilical area and suprapubic area. There is no guarding or rebound.     Hernia: A hernia is present.     Comments: Umbilical hernia, easily reducible  Lymphadenopathy:     Cervical: No  cervical adenopathy.  Skin:    General: Skin is warm and dry.  Neurological:     Mental Status: He is alert and oriented to person, place, and time.  Psychiatric:        Mood and Affect: Mood normal.      ED Treatments / Results  Labs (all labs ordered are listed, but only abnormal results are displayed) Labs Reviewed  COMPREHENSIVE METABOLIC PANEL - Abnormal; Notable for the following components:      Result Value   Potassium 2.8 (*)    Glucose, Bld 130 (*)    BUN 25 (*)    All other components within normal limits  CBC - Abnormal; Notable for the following components:   WBC 15.9 (*)    All other components within normal limits  SARS CORONAVIRUS 2 (HOSPITAL ORDER, [facility])  LIPASE, BLOOD  URINALYSIS, ROUTINE W REFLEX MICROSCOPIC  HEMOGLOBIN AND HEMATOCRIT, BLOOD  HEMOGLOBIN AND HEMATOCRIT, BLOOD  TYPE AND SCREEN    EKG EKG Interpretation  Date/Time:  Wednesday August 08 2019 05:54:06 EDT Ventricular Rate:  66 PR Interval:    QRS Duration: 106 QT Interval:  458 QTC Calculation: 480 R Axis:   75 Text Interpretation:  Sinus rhythm Borderline prolonged QT interval Confirmed by Thayer Jew [phone]) on 08/08/2019 6:21:08 AM   Radiology Ct Abdomen Pelvis W Contrast  Result Date: 08/08/2019 CLINICAL DATA:  Severe abdominal cramping. EXAM: CT ABDOMEN  AND PELVIS WITH CONTRAST TECHNIQUE: Multidetector CT imaging of the abdomen and pelvis was performed using the standard protocol following bolus administration of intravenous contrast. CONTRAST:  171mL OMNIPAQUE IOHEXOL 300 MG/ML  SOLN COMPARISON:  None. 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 Pancreas: Unremarkable. Spleen: Unremarkable. Adrenals/Urinary Tract: Negative adrenals. No hydronephrosis or stone. Right pelviectasis on portal venous phase becomes more symmetric on delayed phase. No evidence of underlying obstructive process. Small right renal cystic density. Unremarkable bladder. Stomach/Bowel: No obstruction. Stool seen throughout most of the colon without wall thickening or rectal over distension. Minimal small bowel mesenteric fat haziness, likely incidental in isolation. The appendix is not seen. No pericecal inflammation. Vascular/Lymphatic: No acute vascular abnormality. Atherosclerotic plaque. No mass or adenopathy. Reproductive:Negative Other: No ascites or pneumoperitoneum. Fatty periumbilical and right inguinal hernias. Musculoskeletal: No acute abnormalities. IMPRESSION: 1. No acute finding. 2. Stool is present throughout most of the colon. 3. Umbilical and right inguinal fatty hernias. Electronically Signed   By: Monte Fantasia M.D.   On: 08/08/2019 05:22    Procedures Procedures (including critical care time)  CRITICAL CARE Performed by: Merryl Hacker   Total critical care time: 25 minutes  Critical care time was exclusive of separately billable procedures and treating other patients.  Critical care was necessary to treat or prevent imminent or life-threatening deterioration.  Critical care was time spent personally by me on the following activities: development of treatment plan with patient and/or surrogate as well as nursing, discussions with consultants,  evaluation of patient's response to treatment, examination of patient, obtaining history from patient or surrogate, ordering and performing treatments and interventions, ordering and review of laboratory studies, ordering and review of radiographic studies, pulse oximetry and re-evaluation of patient's condition.   Medications Ordered in ED Medications  ondansetron (ZOFRAN) injection 4 mg (has no administration in time range)  sodium chloride (PF) 0.9 % injection (has no administration in time range)  ciprofloxacin (CIPRO) IVPB  400 mg (400 mg Intravenous New Bag/Given 08/08/19 0641)  metroNIDAZOLE (FLAGYL) IVPB 500 mg (500 mg Intravenous New Bag/Given 08/08/19 6283)  sodium chloride flush (NS) 0.9 % injection 3 mL (3 mLs Intravenous Given 08/08/19 0521)  sodium chloride 0.9 % bolus 1,000 mL (0 mLs Intravenous Stopped 08/08/19 0427)  morphine 4 MG/ML injection 4 mg (4 mg Intravenous Given 08/08/19 0333)  ondansetron (ZOFRAN) injection 4 mg (4 mg Intravenous Given 08/08/19 0332)  potassium chloride 10 mEq in 100 mL IVPB (0 mEq Intravenous Stopped 08/08/19 0626)  iohexol (OMNIPAQUE) 300 MG/ML solution 100 mL (100 mLs Intravenous Contrast Given 08/08/19 0447)     Initial Impression / Assessment and Plan / ED Course  I have reviewed the triage vital signs and the nursing notes.  Pertinent labs & imaging results that were available during my care of the patient were reviewed by me and considered in my medical decision making (see chart for details).  Clinical Course as of Aug 07 721  Wed Aug 08, 2019  1517 CT scan reviewed.  Largely reassuring.  Patient had a large bloody bowel movement while in the ED.  He denies any recent history of that.  He states approximately 10 years ago he had a GI bleed secondary to Celebrex.  Reports colonoscopy was 5 months ago by Dr. Henrene Pastor.  He reports that he still feels generally weak.   [CH]  6160 Repeat hemoglobin was ordered at 730 with plans to observe for any  further bloody stools.  However, nursing obtained hemoglobin.  Repeat hemoglobin is down to 15.5 from 16.6.  However, I do not feel this likely represents bleeding noted.  There is only 2 hours between the 2 samples.  Again ordered repeat hemoglobin at 4 hours from original sample at 8:00.  We will continue to monitor.   [CH]  0636 SPoke with Dr. Loletha Carrow, [facility].  History provided.  Agrees that patient may be having early colitis.  Ischemic versus infectious.  Given this, recommends admission for continued monitoring.  Agrees with initial dose of Cipro and Flagyl.   [CH]    Clinical Course User Index [CH] Jamarquis Crull, Barbette Hair, MD       Patient presents with fairly acute onset of abdominal pain.  He is uncomfortable appearing but nontoxic.  Vital signs are reassuring.  He does have some tenderness on exam.  Patient was given pain and nausea medication.  Initial lab work obtained.  He has a leukocytosis to 15.9.  Potassium slightly low at 2.8.  This was replaced.  Given his leukocytosis, CT scan was obtained to rule out appendicitis or intra-abdominal infection.  CT scan is largely reassuring.  However, see clinical course above.  Patient began to have multiple bloody bowel movements while in the emergency department.  This is concerning for possible colitis.  Ischemic versus infectious.  He was covered with Cipro and Flagyl.  He remained hemodynamically stable.  Gastroenterology consulted.  Patient will be admitted to the hospitalist.  Final Clinical Impressions(s) / ED Diagnoses   Final diagnoses:  Colitis  Lower GI bleed    ED Discharge Orders    None       Merryl Hacker, MD 08/08/19 [phone]

## 2019-08-08 NOTE — ED Notes (Signed)
Pt had another episode of bloody stool.

## 2019-08-08 NOTE — ED Notes (Addendum)
Pt had one episode of bloody stool while in the ED. No reports of this in the past except 10 years ago. Pt c/o feeling weak. MD aware and at bedside speaking with patient

## 2019-08-08 NOTE — ED Triage Notes (Signed)
Per EMS:  Pt is coming from home with a c/o of severe abdominal cramping that started at 0200. Patient was unable to use the bathroom with this abdominal pain/cramping. Pt was nauseous with no episodes of emesis and reports having numbness and tingling in his hands. LBM was 08/07/2019. Pt reports he has been eating and drinking regularly.  EMS Vitals: HR 75 BP 120/70 SPO2 98% cbg 95 97.4 temp

## 2019-08-08 NOTE — ED Notes (Signed)
Report given to [place], [place]

## 2019-08-08 NOTE — ED Notes (Signed)
Pt ambulated to the bathroom without assistance. Gait steady  

## 2019-08-08 NOTE — ED Notes (Signed)
Pt given 5 oz of water without any report of emesis.

## 2019-08-08 NOTE — H&P (Addendum)
History and Physical    Matthew Trujillo SEG:[id number] DOB: [birth year] DOA: 08/08/2019  PCP: Caren Macadam, MD  Patient coming from: home  I have personally briefly reviewed patient's old medical records in [place]  Chief Complaint: abdominal pain  HPI: Matthew Trujillo is [name] 68 y.o. 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  Pt notes his abdominal pain started last night.  Described as cramping lower abdominal pain.  He thought he was going to have diarrhea, so he when to the bathroom, but was not able to pass any stool.  He noticed sweating, tingling in hands, and lightheadedness, so he called 911.  He had 2 episodes of nonbloody emesis that looked like undigested food.  He's developed bloody stool since being in the ED.  He's had 2 episodes of grossly bloody stool while here.  No fevers, chest pain, shortness of breath, dysuria, LE edema.  No sick contacts.  No new food.  No recent travel.  Denies smoking or drug use.  Has about 7 alcoholic beverages [name] week.     ED Course: Labs, EKG, imaging.  GI c/s given GI bleeding.  Hospitalist to admit.  Review of Systems: As per HPI otherwise 10 point review of systems negative.   Past Medical History:  Diagnosis Date  . ALLERGIC RHINITIS 05/21/2010  . ASTHMA 05/21/2010  . Asthma   . Factor V Leiden ([place])   . HYPERTENSION 05/21/2010   Diffiult to control  . 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 ACHILLES TENDON REPAIR    . DOPPLER ECHOCARDIOGRAPHY  12/30/2011   EF >55%; MILD LVH,MILD LAE,NORMAL DIASTOLIC FUNCTION  . RENAL DOPPLER  12/30/2011   NORMAL RENAL     reports that he has never smoked. He has never used smokeless tobacco. He reports current alcohol use. He reports that he does not use drugs.  Allergies  Allergen Reactions  . Celebrex [Celecoxib]     Blood in stool    Family  History  Problem Relation Age of Onset  . Cancer Father   . Kidney failure Father        single kidney; hx of recurrent infections  . Cancer Brother   . Hypertension Mother   . Hypothyroidism Mother   . Hypothyroidism Sister   . Heart disease Neg Hx   . Colon cancer Neg Hx   . Esophageal cancer Neg Hx   . Stomach cancer Neg Hx   . Rectal cancer Neg Hx    Prior to Admission medications   Medication Sig Start Date End Date Taking? Authorizing Provider  albuterol (PROVENTIL HFA;VENTOLIN HFA) 108 (90 Base) MCG/ACT inhaler INHALE 2 PUFFS EVERY 4 HOURS AS NEEDED WHEEZING Patient taking differently: Inhale 2 puffs into the lungs every 4 (four) hours as needed for wheezing.  09/22/18  Yes Marletta Lor, MD  albuterol (PROVENTIL) (2.5 MG/3ML) 0.083% nebulizer solution Take 3 mLs (2.5 mg total) by nebulization every 6 (six) hours as needed for wheezing or shortness of breath. 12/08/18  Yes Koberlein, Junell C, MD  amLODipine (NORVASC) 10 MG tablet TAKE 10 mg TABLET BY MOUTH EVERY DAY Patient taking differently: Take 10 mg by mouth daily.  09/21/18  Yes Leonie Man, MD  Calcium Carbonate-Vitamin D (CALTRATE 600+D) 600-400 MG-UNIT per tablet Take 1 tablet by mouth 2 (two) times daily.    Yes [provider]  chlorthalidone (HYGROTON) 50 MG tablet TAKE 1 TABLET BY MOUTH EVERY DAY Patient taking differently: Take 50 mg by mouth daily.  09/21/18  Yes Leonie Man, MD  Cholecalciferol (VITAMIN D-3) 5000 units TABS Take 5,000 Units by mouth daily.   Yes [provider]  ELIQUIS 5 MG TABS tablet TAKE 1 TABLET BY MOUTH TWICE Ji [place] DAY Patient taking differently: Take 5 mg by mouth 2 (two) times daily.  10/12/18  Yes Dorena Cookey, MD  fluticasone (FLONASE) 50 MCG/ACT nasal spray Place 1 spray into both nostrils daily. Patient taking differently: Place 1 spray into both nostrils daily as needed for allergies.  05/25/19  Yes Koberlein, Junell C, MD  fluticasone (FLOVENT HFA) 110  MCG/ACT inhaler Inhale 2 puffs into the lungs 2 (two) times daily. 12/08/18  Yes Koberlein, Junell C, MD  methimazole (TAPAZOLE) 5 MG tablet TAKE 1 TABLET BY MOUTH EVERY DAY Patient taking differently: Take 5 mg by mouth daily.  04/30/19  Yes Renato Shin, MD  Multiple Vitamin (MULTIVITAMIN) tablet Take 1 tablet by mouth daily.     Yes [provider]  Omega-3 Fatty Acids (FISH OIL) 1000 MG CAPS Take by mouth 2 (two) times daily.    Yes [provider]  ramipril (ALTACE) 10 MG capsule TAKE 1 CAPSULE BY MOUTH EVERY DAY Patient taking differently: Take 10 mg by mouth daily.  06/20/19  Yes Leonie Man, MD  Spacer/Aero-Holding Chambers (E-Z SPACER) inhaler Use as instructed 08/16/18   Caren Macadam, MD    Physical Exam: Vitals:   08/08/19 0524 08/08/19 0643 08/08/19 0745 08/08/19 0755  BP: (!) 164/83 (!) 143/71    Pulse: 67 72 67 67  Resp: 20 14 12 14   Temp:      TempSrc:      SpO2: 98% 97% 99% 99%    Constitutional: NAD, calm, comfortable Vitals:   08/08/19 0524 08/08/19 0643 08/08/19 0745 08/08/19 0755  BP: (!) 164/83 (!) 143/71    Pulse: 67 72 67 67  Resp: 20 14 12 14   Temp:      TempSrc:      SpO2: 98% 97% 99% 99%   Eyes: PERRL, lids and conjunctivae normal ENMT: Mucous membranes are moist. Posterior pharynx clear of any exudate or lesions.Normal dentition.  Neck: normal, supple, no masses, no thyromegaly Respiratory: clear to auscultation bilaterally, no wheezing, no crackles. Normal respiratory effort. No accessory muscle use.  Cardiovascular: Regular rate and rhythm, no murmurs / rubs / gallops. No extremity edema. 2+ pedal pulses.  Abdomen: mild lower abdominal tenderness, no masses palpated. No hepatosplenomegaly.  Umbilical hernia. Musculoskeletal: no clubbing / cyanosis. No joint deformity upper and lower extremities. Good ROM, no contractures. Normal muscle tone.  Skin: no rashes, lesions, ulcers. No induration Neurologic: CN 2-12 grossly  intact. Sensation intact. Moving all extremities. Psychiatric: Normal judgment and insight. Alert and oriented x 3. Normal mood.   Labs on Admission: I have personally reviewed following labs and imaging studies  CBC: Recent Labs  Lab 08/08/19 0334 08/08/19 0554 08/08/19 0800  WBC 15.9*  --   --   HGB 16.6 15.5 16.8  HCT 48.3 46.2 48.3  MCV 88.8  --   --   PLT 358  --   --    Basic Metabolic Panel: Recent Labs  Lab 08/08/19 0334  NA 140  K 2.8*  CL 102  CO2 24  GLUCOSE 130*  BUN 25*  CREATININE 1.03  CALCIUM 9.8  MG 2.2  GFR: CrCl cannot be calculated (Unknown ideal weight.). Liver Function Tests: Recent Labs  Lab 08/08/19 0334  AST 38  ALT 43  ALKPHOS 50  BILITOT 0.9  PROT 7.5  ALBUMIN 4.5   Recent Labs  Lab 08/08/19 0334  LIPASE 29   No results for input(s): AMMONIA in the last 168 hours. Coagulation Profile: No results for input(s): INR, PROTIME in the last 168 hours. Cardiac Enzymes: No results for input(s): CKTOTAL, CKMB, CKMBINDEX, TROPONINI in the last 168 hours. BNP (last 3 results) No results for input(s): PROBNP in the last 8760 hours. HbA1C: No results for input(s): HGBA1C in the last 72 hours. CBG: No results for input(s): GLUCAP in the last 168 hours. Lipid Profile: No results for input(s): CHOL, HDL, LDLCALC, TRIG, CHOLHDL, LDLDIRECT in the last 72 hours. Thyroid Function Tests: No results for input(s): TSH, T4TOTAL, FREET4, T3FREE, THYROIDAB in the last 72 hours. Anemia Panel: No results for input(s): VITAMINB12, FOLATE, FERRITIN, TIBC, IRON, RETICCTPCT in the last 72 hours. Urine analysis:    Component Value Date/Time   COLORURINE YELLOW 08/08/2019 0334   APPEARANCEUR CLEAR 08/08/2019 0334   LABSPEC 1.013 08/08/2019 0334   PHURINE 7.0 08/08/2019 0334   GLUCOSEU NEGATIVE 08/08/2019 0334   HGBUR NEGATIVE 08/08/2019 0334   BILIRUBINUR NEGATIVE 08/08/2019 0334   KETONESUR NEGATIVE 08/08/2019 0334   PROTEINUR NEGATIVE 08/08/2019  0334   NITRITE NEGATIVE 08/08/2019 0334   LEUKOCYTESUR NEGATIVE 08/08/2019 0334    Radiological Exams on Admission: Ct Abdomen Pelvis W Contrast  Result Date: 08/08/2019 CLINICAL DATA:  Severe abdominal cramping. EXAM: CT ABDOMEN AND PELVIS WITH CONTRAST TECHNIQUE: Multidetector CT imaging of the abdomen and pelvis was performed using the standard protocol following bolus administration of intravenous contrast. CONTRAST:  178mL OMNIPAQUE IOHEXOL 300 MG/ML  SOLN COMPARISON:  None. 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 Pancreas: Unremarkable. Spleen: Unremarkable. Adrenals/Urinary Tract: Negative adrenals. No hydronephrosis or stone. Right pelviectasis on portal venous phase becomes more symmetric on delayed phase. No evidence of underlying obstructive process. Small right renal cystic density. Unremarkable bladder. Stomach/Bowel: No obstruction. Stool seen throughout most of the colon without wall thickening or rectal over distension. Minimal small bowel mesenteric fat haziness, likely incidental in isolation. The appendix is not seen. No pericecal inflammation. Vascular/Lymphatic: No acute vascular abnormality. Atherosclerotic plaque. No mass or adenopathy. Reproductive:Negative Other: No ascites or pneumoperitoneum. Fatty periumbilical and right inguinal hernias. Musculoskeletal: No acute abnormalities. IMPRESSION: 1. No acute finding. 2. Stool is present throughout most of the colon. 3. Umbilical and right inguinal fatty hernias. Electronically Signed   By: Monte Fantasia M.D.   On: 08/08/2019 05:22    EKG: Independently reviewed. Sinus rhythm, prolonged QTc.    Assessment/Plan Principal Problem:   GI bleed Active Problems:   Essential hypertension   History of pulmonary embolism   Asthma in adult, mild intermittent, uncomplicated   Graves disease   Abdominal pain   Factor V Leiden  (H[facility])    Abdominal Pain  GI Bleed: concern for colitis (concern for ischemic based on hx) given sudden abdominal pain with bright red blood per rectum.  CT was without acute findings.  Leukocytosis to 15.9.  He had colonoscopy in 10/2018 notable for polyp which was removed (tubular adenoma).  GI has been c/s by ED.  Hemodynamically stable, no anemia on labs. S/p cipro/flagyl in ED Continue ceftriaxone/flagyl Hold eliquis  PPI BID Clear liquid diet for  now GI consult, appreciate recommendations Antiemetics and analgesia prn       Factor V Leiden  Hx VTE (x2): pt with hx PE/DVT.  Chart hx of factor V leiden.  Has indication for lifelong anticoagulation, but currently eliquis on hold with GI bleeding above.  Hold anticoagulation for now.  Last dose of eliquis was 5/11 PM.    Umbilical and right inguinal fatty hernias: outpatient follow up with surgery, continue to monitor closely  Hypokalemia: replace and follow.  Follow magnesium.   Hypertension: continue amlodipine.  Hold chlorthalidone given hypokalemia.  Hold ramipril with possible procedure.  Resume as able.  Graves Disease: continue methimazole  Asthma: prn albuterol, flovent   Prolonged QTc: caution with QT prolonging meds, telemetry.  Follow repeat EKG in AM.  Mild Hyperglycemia: follow A1c  DVT prophylaxis: SCDs Code Status: full Family Communication: none at bedside, pt decline, said he had already updated  Disposition Plan: pending  Consults called: GI Admission status: observation    Fayrene Helper MD [facility] Pager [email or web address]  If 7PM-7AM, please contact night-coverage [email or web address] Password [facility]  08/08/2019, 9:07 AM

## 2019-08-09 DIAGNOSIS — K529 Noninfective gastroenteritis and colitis, unspecified: Secondary | ICD-10-CM | POA: Diagnosis present

## 2019-08-09 DIAGNOSIS — Z86711 Personal history of pulmonary embolism: Secondary | ICD-10-CM | POA: Diagnosis not present

## 2019-08-09 DIAGNOSIS — Z7951 Long term (current) use of inhaled steroids: Secondary | ICD-10-CM | POA: Diagnosis not present

## 2019-08-09 DIAGNOSIS — R1032 Left lower quadrant pain: Secondary | ICD-10-CM

## 2019-08-09 DIAGNOSIS — J4520 Mild intermittent asthma, uncomplicated: Secondary | ICD-10-CM | POA: Diagnosis not present

## 2019-08-09 DIAGNOSIS — J309 Allergic rhinitis, unspecified: Secondary | ICD-10-CM | POA: Diagnosis not present

## 2019-08-09 DIAGNOSIS — K429 Umbilical hernia without obstruction or gangrene: Secondary | ICD-10-CM | POA: Diagnosis not present

## 2019-08-09 DIAGNOSIS — Z79899 Other long term (current) drug therapy: Secondary | ICD-10-CM | POA: Diagnosis not present

## 2019-08-09 DIAGNOSIS — I10 Essential (primary) hypertension: Secondary | ICD-10-CM | POA: Diagnosis not present

## 2019-08-09 DIAGNOSIS — K922 Gastrointestinal hemorrhage, unspecified: Secondary | ICD-10-CM | POA: Diagnosis not present

## 2019-08-09 DIAGNOSIS — Z8249 Family history of ischemic heart disease and other diseases of the circulatory system: Secondary | ICD-10-CM | POA: Diagnosis not present

## 2019-08-09 DIAGNOSIS — Z86718 Personal history of other venous thrombosis and embolism: Secondary | ICD-10-CM | POA: Diagnosis not present

## 2019-08-09 DIAGNOSIS — K55039 Acute (reversible) ischemia of large intestine, extent unspecified: Secondary | ICD-10-CM | POA: Diagnosis not present

## 2019-08-09 DIAGNOSIS — Z7901 Long term (current) use of anticoagulants: Secondary | ICD-10-CM | POA: Diagnosis not present

## 2019-08-09 DIAGNOSIS — D6851 Activated protein C resistance: Secondary | ICD-10-CM | POA: Diagnosis not present

## 2019-08-09 DIAGNOSIS — E0500 Thyrotoxicosis with diffuse goiter without thyrotoxic crisis or storm: Secondary | ICD-10-CM | POA: Diagnosis present

## 2019-08-09 DIAGNOSIS — E785 Hyperlipidemia, unspecified: Secondary | ICD-10-CM | POA: Diagnosis not present

## 2019-08-09 DIAGNOSIS — E876 Hypokalemia: Secondary | ICD-10-CM | POA: Diagnosis not present

## 2019-08-09 DIAGNOSIS — R739 Hyperglycemia, unspecified: Secondary | ICD-10-CM | POA: Diagnosis not present

## 2019-08-09 DIAGNOSIS — Z20828 Contact with and (suspected) exposure to other viral communicable diseases: Secondary | ICD-10-CM | POA: Diagnosis not present

## 2019-08-09 DIAGNOSIS — Z888 Allergy status to other drugs, medicaments and biological substances status: Secondary | ICD-10-CM | POA: Diagnosis not present

## 2019-08-09 LAB — CBC
HCT: 44.7 % (ref 39.0–52.0)
Hemoglobin: 15.2 g/dL (ref 13.0–17.0)
MCH: 30.6 pg (ref 26.0–34.0)
MCHC: 34 g/dL (ref 30.0–36.0)
MCV: 90.1 fL (ref 80.0–100.0)
Platelets: 301 10*3/uL (ref 150–400)
RBC: 4.96 MIL/uL (ref 4.22–5.81)
RDW: 14.3 % (ref 11.5–15.5)
WBC: 17.7 10*3/uL — ABNORMAL HIGH (ref 4.0–10.5)
nRBC: 0 % (ref 0.0–0.2)

## 2019-08-09 LAB — COMPREHENSIVE METABOLIC PANEL
ALT: 31 U/L (ref 0–44)
AST: 22 U/L (ref 15–41)
Albumin: 4 g/dL (ref 3.5–5.0)
Alkaline Phosphatase: 45 U/L (ref 38–126)
Anion gap: 9 (ref 5–15)
BUN: 12 mg/dL (ref 8–23)
CO2: 26 mmol/L (ref 22–32)
Calcium: 8.9 mg/dL (ref 8.9–10.3)
Chloride: 102 mmol/L (ref 98–111)
Creatinine, Ser: 0.94 mg/dL (ref 0.61–1.24)
GFR calc Af Amer: >60 mL/min (ref >60)
GFR calc non Af Amer: >60 mL/min (ref >60)
Glucose, Bld: 122 mg/dL — ABNORMAL HIGH (ref 70–99)
Potassium: 3.4 mmol/L — ABNORMAL LOW (ref 3.5–5.1)
Sodium: 137 mmol/L (ref 135–145)
Total Bilirubin: 1.6 mg/dL — ABNORMAL HIGH (ref 0.3–1.2)
Total Protein: 6.7 g/dL (ref 6.5–8.1)

## 2019-08-09 LAB — HIV ANTIBODY (ROUTINE TESTING W REFLEX): HIV Screen 4th Generation wRfx: NONREACTIVE

## 2019-08-09 MED ORDER — KCL IN DEXTROSE-NACL 20-5-0.9 MEQ/L-%-% IV SOLN
INTRAVENOUS | Status: DC
  Administered 2019-08-09 – 2019-08-10 (×2): via INTRAVENOUS
  Filled 2019-08-09 (×3): qty 1000

## 2019-08-09 MED ORDER — POTASSIUM CHLORIDE 2 MEQ/ML IV SOLN: INTRAVENOUS | Status: DC

## 2019-08-09 MED ORDER — POTASSIUM CHLORIDE CRYS ER 20 MEQ PO TBCR
40.0000 meq | EXTENDED_RELEASE_TABLET | Freq: Once | ORAL | Status: AC
  Administered 2019-08-09: 40 meq via ORAL
  Filled 2019-08-09: qty 2

## 2019-08-09 MED ORDER — PROMETHAZINE HCL 25 MG/ML IJ SOLN
6.2500 mg | Freq: Three times a day (TID) | INTRAMUSCULAR | Status: DC | PRN
  Administered 2019-08-09: 6.25 mg via INTRAVENOUS
  Filled 2019-08-09: qty 1

## 2019-08-09 NOTE — Progress Notes (Addendum)
[facility] Progress Note  CC:  GI bleed, ischemic colitis  Subjective:  Doing much better today.  Has not passed any blood in about 24 hours.  Hgb remains normal.  WBC count up slightly higher today.  Pain has become less frequent but when it does come it can still be severe at times.  Was having a lot of nausea but seems to have gotten it under control somewhat now.  Tolerating some clear liquids.  Concerned with no BM in 1-2 days, but hospitalist has started Miralax now so that should help.  Objective:  Vital signs in last 24 hours: Temp:  [98.6 F (37 C)-98.9 F (37.2 C)] 98.8 F (37.1 C) (08/13 0514) Pulse Rate:  [60-77] 77 (08/13 0514) Resp:  [11-20] 16 (08/13 0514) BP: (111-141)/(52-73) 141/73 (08/13 0514) SpO2:  [95 %-100 %] 99 % (08/13 0849) Weight:  [99.9 kg] 99.9 kg (08/13 0514) Last BM Date: 08/08/19 General:  Alert, Well-developed, in NAD Heart:  Regular rate and rhythm; no murmurs Pulm:  CTAB.  No increased WOB. Abdomen:  Soft, non-distended.  BS present.  Minimal TTP.  Umbilical hernia noted. Extremities:  Without edema. Neurologic:  Alert and oriented x 4;  grossly normal neurologically. Psych:  Alert and cooperative. Normal mood and affect.  Intake/Output from previous day: 08/12 0701 - 08/13 0700 In: 1041.9 [I.V.:717.2; IV Piggyback:324.7] Out: 1500 [Urine:1500]  Lab Results: Recent Labs    08/08/19 0334  08/08/19 0800 08/08/19 1554 08/09/19 0538  WBC 15.9*  --   --   --  17.7*  HGB 16.6   < > 16.8 15.2 15.2  HCT 48.3   < > 48.3 46.2 44.7  PLT 358  --   --   --  301   < > = values in this interval not displayed.   BMET Recent Labs    08/08/19 0334 08/09/19 0538  NA 140 137  K 2.8* 3.4*  CL 102 102  CO2 24 26  GLUCOSE 130* 122*  BUN 25* 12  CREATININE 1.03 0.94  CALCIUM 9.8 8.9   LFT Recent Labs    08/09/19 0538  PROT 6.7  ALBUMIN 4.0  AST 22  ALT 31  ALKPHOS 45  BILITOT 1.6*   Ct Abdomen Pelvis W Contrast   Result Date: 08/08/2019 CLINICAL DATA:  Severe abdominal cramping. EXAM: CT ABDOMEN AND PELVIS WITH CONTRAST TECHNIQUE: Multidetector CT imaging of the abdomen and pelvis was performed using the standard protocol following bolus administration of intravenous contrast. CONTRAST:  115mL OMNIPAQUE IOHEXOL 300 MG/ML  SOLN COMPARISON:  None. 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 Pancreas: Unremarkable. Spleen: Unremarkable. Adrenals/Urinary Tract: Negative adrenals. No hydronephrosis or stone. Right pelviectasis on portal venous phase becomes more symmetric on delayed phase. No evidence of underlying obstructive process. Small right renal cystic density. Unremarkable bladder. Stomach/Bowel: No obstruction. Stool seen throughout most of the colon without wall thickening or rectal over distension. Minimal small bowel mesenteric fat haziness, likely incidental in isolation. The appendix is not seen. No pericecal inflammation. Vascular/Lymphatic: No acute vascular abnormality. Atherosclerotic plaque. No mass or adenopathy. Reproductive:Negative Other: No ascites or pneumoperitoneum. Fatty periumbilical and right inguinal hernias. Musculoskeletal: No acute abnormalities. IMPRESSION: 1. No acute finding. 2. Stool is present throughout most of the colon. 3. Umbilical and right inguinal fatty hernias. Electronically Signed   By: Monte Fantasia M.D.   On: 08/08/2019 05:22  Assessment / Plan: *68 year old male with sudden onset severe crampy abdominal pain followed by bloody stool.  CT scan negative but suspect this is ischemic colitis.  Quite possible that it was too early or mild for CT scan to detect.  Has leukocytosis, which is up slightly higher today; likely reactive.  Hgb remains normal.  Clinically improved. *Hypokalemia:  K+ 3.4 today. *Leiden Factor 5 with history of PE on Eliquis:  On hold for now.   **Continue to treat conservatively/with supportive care.  IVF's, pain control, antiemetics.  Antibiotic coverage not unreasonable for now, but likely will not need upon discharge.   **Continue clears for now.  ? Advance to full liquids later today. **Would not plan to scope or provide further imaging unless he fails to improve or worsens.  Would then start with repeat CT scan at that point.   LOS: 0 days   Laban Emperor.   08/09/2019, 9:10 AM

## 2019-08-09 NOTE — Progress Notes (Signed)
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  Patient reported multiple episode of abdominal pain that started the night prior to admission, cramping in nature.  Accompanied by bloody bowel movement.  Evaluation in the ED: CT pelvis:No acute finding. Stool is present throughout most of the colon. Umbilical and right inguinal fatty hernias.  Patient was found to have also hypokalemia with potassium level at 2.8.   Assessment & Plan:   Principal Problem:   GI bleed Active Problems:   Essential hypertension   History of pulmonary embolism   Asthma in adult, mild intermittent, uncomplicated   Graves disease   Abdominal pain   Factor V Leiden ([place])   Acute ischemic colitis ([place])    1-Lower GI bleed, likely related to ischemic colitis Hemoglobin has remained stable around 15. He had another episode of cramping abdominal pain this afternoon and bloody bowel movement. We will continue with IV fluids, IV antibiotics and supportive care. PRN oxycodone. Appreciate GI evaluation. Continue with IV ceftriaxone and Flagyl.  2-Hypokalemia: Supplemented orally with 40 mEq x 1. We will add KCl to IV fluids.  3-Factor V Leiden/history of VTE x2: He needs lifelong anticoagulation, currently holding Eliquis due to active GI bleed.  4-Hypertension: Continue with amlodipine.  Hold chlorthalidone and ramipril. Graves' disease: Continue with methimazole  5-Asthma; PRN albuterol.  6-prolong QT; QT improved this morning replete potassium.  7-Hyperglycemia: Hemoglobin A1c normal 5.5  Estimated body mass index is 28.28 kg/m as calculated from the following:   Height as of 05/07/19: 6\' 2"  (1.88 m).   Weight as of this encounter: 99.9 kg.   DVT prophylaxis:  SCDs Code Status: Full code Family Communication: Care discussed with patient Disposition Plan: Remain in the hospital for further management of ischemic colitis, supportive care with IV fluids, IV antibiotics.  Patient is still with actively GI bleed. Consultants:   GI  Procedures:   None  Antimicrobials: Ceftriaxone 8/12 Flagyl 8-12   Subjective: Patient reports cramping abdominal pain today 2 episodes.  One bloody bowel movement at 11 AM  Objective: Vitals:   08/08/19 1938 08/08/19 2116 08/09/19 0514 08/09/19 0849  BP:  (!) 111/52 (!) 141/73   Pulse:  73 77   Resp:  14 16   Temp:  98.6 F (37 C) 98.8 F (37.1 C)   TempSrc:  Oral Oral   SpO2: 100% 100% 98% 99%  Weight:   99.9 kg     Intake/Output Summary (Last 24 hours) at 08/09/2019 1241 Last data filed at 08/09/2019 0900 Gross per 24 hour  Intake 1281.86 ml  Output 1500 ml  Net -218.14 ml   Filed Weights   08/09/19 0514  Weight: 99.9 kg    Examination:  General exam: Appears calm and comfortable  Respiratory system: Clear to auscultation. Respiratory effort normal. Cardiovascular system: S1 & S2 heard, RRR. No JVD, murmurs, rubs, gallops or clicks. No pedal edema. Gastrointestinal system: Abdomen is nondistended, soft and nontender. No organomegaly or masses felt. Normal bowel sounds heard. Central nervous system: Alert and oriented. No focal neurological deficits. Extremities: Symmetric 5 x 5 power. Skin: No rashes, lesions or ulcers Psychiatry: Judgement and insight appear normal. Mood & affect appropriate.     Data Reviewed: I have personally reviewed following labs and imaging studies  CBC:  Recent Labs  Lab 08/08/19 0334 08/08/19 0554 08/08/19 0800 08/08/19 1554 08/09/19 0538  WBC 15.9*  --   --   --  17.7*  HGB 16.6 15.5 16.8 15.2 15.2  HCT 48.3 46.2 48.3 46.2 44.7  MCV 88.8  --   --   --  90.1  PLT 358  --   --   --  573   Basic Metabolic Panel: Recent Labs  Lab 08/08/19 0334  08/09/19 0538  NA 140 137  K 2.8* 3.4*  CL 102 102  CO2 24 26  GLUCOSE 130* 122*  BUN 25* 12  CREATININE 1.03 0.94  CALCIUM 9.8 8.9  MG 2.2  --    GFR: Estimated Creatinine Clearance: 97.6 mL/min (by C-G formula based on SCr of 0.94 mg/dL). Liver Function Tests: Recent Labs  Lab 08/08/19 0334 08/09/19 0538  AST 38 22  ALT 43 31  ALKPHOS 50 45  BILITOT 0.9 1.6*  PROT 7.5 6.7  ALBUMIN 4.5 4.0   Recent Labs  Lab 08/08/19 0334  LIPASE 29   No results for input(s): AMMONIA in the last 168 hours. Coagulation Profile: No results for input(s): INR, PROTIME in the last 168 hours. Cardiac Enzymes: No results for input(s): CKTOTAL, CKMB, CKMBINDEX, TROPONINI in the last 168 hours. BNP (last 3 results) No results for input(s): PROBNP in the last 8760 hours. HbA1C: Recent Labs    08/08/19 0800  HGBA1C 5.5   CBG: No results for input(s): GLUCAP in the last 168 hours. Lipid Profile: No results for input(s): CHOL, HDL, LDLCALC, TRIG, CHOLHDL, LDLDIRECT in the last 72 hours. Thyroid Function Tests: No results for input(s): TSH, T4TOTAL, FREET4, T3FREE, THYROIDAB in the last 72 hours. Anemia Panel: No results for input(s): VITAMINB12, FOLATE, FERRITIN, TIBC, IRON, RETICCTPCT in the last 72 hours. Sepsis Labs: No results for input(s): PROCALCITON, LATICACIDVEN in the last 168 hours.  Recent Results (from the past 240 hour(s))  SARS Coronavirus 2 [facility] order, Performed in [facility] hospital lab) Nasopharyngeal Nasopharyngeal Swab     Status: None   Collection Time: 08/08/19  8:02 AM   Specimen: Nasopharyngeal Swab  Result Value Ref Range Status   SARS Coronavirus 2 NEGATIVE NEGATIVE Final    Comment: (NOTE) If result is NEGATIVE SARS-CoV-2 target nucleic acids are NOT DETECTED. The SARS-CoV-2 RNA is generally detectable in upper and lower  respiratory specimens during the acute phase of infection. The lowest  concentration of SARS-CoV-2 viral copies this assay can  detect is 250  copies / mL. A negative result does not preclude SARS-CoV-2 infection  and should not be used as the sole basis for treatment or other  patient management decisions.  A negative result may occur with  improper specimen collection / handling, submission of specimen other  than nasopharyngeal swab, presence of viral mutation(s) within the  areas targeted by this assay, and inadequate number of viral copies  (<250 copies / mL). A negative result must be combined with clinical  observations, patient history, and epidemiological information. If result is POSITIVE SARS-CoV-2 target nucleic acids are DETECTED. The SARS-CoV-2 RNA is generally detectable in upper and lower  respiratory specimens dur ing the acute phase of infection.  Positive  results are indicative of active infection with SARS-CoV-2.  Clinical  correlation with patient history and other diagnostic information is  necessary to determine patient infection status.  Positive results do  not rule out bacterial infection or co-infection with other viruses. If result is PRESUMPTIVE POSTIVE SARS-CoV-2  nucleic acids MAY BE PRESENT.   A presumptive positive result was obtained on the submitted specimen  and confirmed on repeat testing.  While 2019 novel coronavirus  (SARS-CoV-2) nucleic acids may be present in the submitted sample  additional confirmatory testing may be necessary for epidemiological  and / or clinical management purposes  to differentiate between  SARS-CoV-2 and other Sarbecovirus currently known to infect humans.  If clinically indicated additional testing with an alternate test  methodology [phone]) is advised. The SARS-CoV-2 RNA is generally  detectable in upper and lower respiratory sp ecimens during the acute  phase of infection. The expected result is Negative. Fact Sheet for Patients:  StrictlyIdeas.no Fact Sheet for Healthcare  Providers: BankingDealers.co.za This test is not yet approved or cleared by the [place] FDA and has been authorized for detection and/or diagnosis of SARS-CoV-2 by FDA under an Emergency Use Authorization (EUA).  This EUA will remain in effect (meaning this test can be used) for the duration of the COVID-19 declaration under Section 564(b)(1) of the Act, 21 U.S.C. section 360bbb-3(b)(1), unless the authorization is terminated or revoked sooner. Performed at [facility], [place] [address], [place], [place] [address]          Radiology Studies: Ct Abdomen Pelvis W Contrast  Result Date: 08/08/2019 CLINICAL DATA:  Severe abdominal cramping. EXAM: CT ABDOMEN AND PELVIS WITH CONTRAST TECHNIQUE: Multidetector CT imaging of the abdomen and pelvis was performed using the standard protocol following bolus administration of intravenous contrast. CONTRAST:  153mL OMNIPAQUE IOHEXOL 300 MG/ML  SOLN COMPARISON:  None. 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 Pancreas: Unremarkable. Spleen: Unremarkable. Adrenals/Urinary Tract: Negative adrenals. No hydronephrosis or stone. Right pelviectasis on portal venous phase becomes more symmetric on delayed phase. No evidence of underlying obstructive process. Small right renal cystic density. Unremarkable bladder. Stomach/Bowel: No obstruction. Stool seen throughout most of the colon without wall thickening or rectal over distension. Minimal small bowel mesenteric fat haziness, likely incidental in isolation. The appendix is not seen. No pericecal inflammation. Vascular/Lymphatic: No acute vascular abnormality. Atherosclerotic plaque. No mass or adenopathy. Reproductive:Negative Other: No ascites or pneumoperitoneum. Fatty periumbilical and right inguinal hernias. Musculoskeletal: No acute abnormalities. IMPRESSION: 1.  No acute finding. 2. Stool is present throughout most of the colon. 3. Umbilical and right inguinal fatty hernias. Electronically Signed   By: Monte Fantasia M.D.   On: 08/08/2019 05:22        Scheduled Meds:  amLODipine  10 mg Oral Daily   budesonide (PULMICORT) nebulizer solution  0.25 mg Nebulization BID   methimazole  5 mg Oral Daily   pantoprazole (PROTONIX) IV  40 mg Intravenous Q12H   polyethylene glycol  17 g Oral BID   Continuous Infusions:  cefTRIAXone (ROCEPHIN)  IV 200 mL/hr at 08/08/19 1806   dextrose 5 %-0.9% NaCl with KCl/Additives Pediatric custom IV fluid     metronidazole 500 mg (08/09/19 0545)     LOS: 0 days    Time spent: 35 minutes    Elmarie Shiley, MD [facility] Pager [phone]  If 7PM-7AM, please contact night-coverage [email or web address] Password [facility] 08/09/2019, 12:41 PM

## 2019-08-10 ENCOUNTER — Other Ambulatory Visit: Payer: Self-pay

## 2019-08-10 DIAGNOSIS — Z7901 Long term (current) use of anticoagulants: Secondary | ICD-10-CM

## 2019-08-10 DIAGNOSIS — D72829 Elevated white blood cell count, unspecified: Secondary | ICD-10-CM

## 2019-08-10 LAB — CBC
HCT: 44.9 % (ref 39.0–52.0)
Hemoglobin: 14.9 g/dL (ref 13.0–17.0)
MCH: 30.2 pg (ref 26.0–34.0)
MCHC: 33.2 g/dL (ref 30.0–36.0)
MCV: 91.1 fL (ref 80.0–100.0)
Platelets: 296 10*3/uL (ref 150–400)
RBC: 4.93 MIL/uL (ref 4.22–5.81)
RDW: 14 % (ref 11.5–15.5)
WBC: 19.5 10*3/uL — ABNORMAL HIGH (ref 4.0–10.5)
nRBC: 0 % (ref 0.0–0.2)

## 2019-08-10 LAB — BASIC METABOLIC PANEL
Anion gap: 11 (ref 5–15)
BUN: 9 mg/dL (ref 8–23)
CO2: 23 mmol/L (ref 22–32)
Calcium: 8.5 mg/dL — ABNORMAL LOW (ref 8.9–10.3)
Chloride: 102 mmol/L (ref 98–111)
Creatinine, Ser: 0.84 mg/dL (ref 0.61–1.24)
GFR calc Af Amer: >60 mL/min (ref >60)
GFR calc non Af Amer: >60 mL/min (ref >60)
Glucose, Bld: 115 mg/dL — ABNORMAL HIGH (ref 70–99)
Potassium: 3.6 mmol/L (ref 3.5–5.1)
Sodium: 136 mmol/L (ref 135–145)

## 2019-08-10 LAB — HEPATIC FUNCTION PANEL
ALT: 25 U/L (ref 0–44)
AST: 23 U/L (ref 15–41)
Albumin: 3.5 g/dL (ref 3.5–5.0)
Alkaline Phosphatase: 46 U/L (ref 38–126)
Bilirubin, Direct: 0.3 mg/dL — ABNORMAL HIGH (ref 0.0–0.2)
Indirect Bilirubin: 0.9 mg/dL (ref 0.3–0.9)
Total Bilirubin: 1.2 mg/dL (ref 0.3–1.2)
Total Protein: 6.6 g/dL (ref 6.5–8.1)

## 2019-08-10 MED ORDER — CIPROFLOXACIN HCL 500 MG PO TABS: 500.0000 mg | ORAL_TABLET | Freq: Two times a day (BID) | ORAL | 0 refills | Status: DC

## 2019-08-10 MED ORDER — CIPROFLOXACIN HCL 500 MG PO TABS: 500.0000 mg | ORAL_TABLET | Freq: Two times a day (BID) | ORAL | 0 refills | Status: AC

## 2019-08-10 MED ORDER — METRONIDAZOLE 500 MG PO TABS: 500.0000 mg | ORAL_TABLET | Freq: Three times a day (TID) | ORAL | 0 refills | Status: DC

## 2019-08-10 NOTE — Discharge Summary (Signed)
Physician Discharge Summary  Matthew Trujillo WIO:[id number] DOB: 04-11-1951 DOA: 08/08/2019  PCP: Caren Macadam, MD  Admit date: 08/08/2019 Discharge date: 08/10/2019  Admitted From: Home  Disposition: Home   Recommendations for Outpatient Follow-up:  1. Follow up with PCP in 1-2 weeks 2. Please obtain BMP/CBC in one week 3. Needs repeat cbc 4. Follow up with GI for further care of ischemic colitis.   Home Health: none  Discharge Condition: stable.  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  Patient reported multiple episode of abdominal pain that started the night prior to admission, cramping in nature.  Accompanied by bloody bowel movement.  Evaluation in the ED: CT pelvis:No acute finding. Stool is present throughout most of the colon. Umbilical and right inguinal fatty hernias.  Patient was found to have also hypokalemia with potassium level at 2.8.   1-Lower GI bleed, likely related to ischemic colitis Treated with  IV fluids, IV antibiotics and supportive care. PRN oxycodone. Appreciate GI evaluation. Treated  with IV ceftriaxone and Flagyl for 2 days.  abdominal pain has resolved, hb stable. Tolerating diet. Having BM with no blood.  Discussed with Dr.  Henrene Pastor, ok to discharge home on cipro for 3 days. Patient will follow up with them in the office for repeat CBC>  Ok to resume eliquis.  In regards to leokocytosis, patient has been afebrile, no cough, no dysuria. His GI symptoms has improved. Plan to discharge today on antibiotics and close follow up.   2-Hypokalemia: Supplemented orally with 40 mEq x 1. Resolved.   3-Factor V Leiden/history of VTE x2: He needs lifelong anticoagulation, currently holding Eliquis due to active GI bleed.  4-Hypertension: Continue with  amlodipine.  Hold chlorthalidone and ramipril. Graves' disease: Continue with methimazole  5-Asthma; PRN albuterol.  6-prolong QT; QT improved this morning replete potassium.  7-Hyperglycemia: Hemoglobin A1c normal 5.5   Discharge Diagnoses:  Principal Problem:   GI bleed Active Problems:   Essential hypertension   History of pulmonary embolism   Asthma in adult, mild intermittent, uncomplicated   Graves disease   Abdominal pain   Factor V Leiden ([place])   Acute ischemic colitis [facility])    Discharge Instructions  Discharge Instructions    Diet - low sodium heart healthy   Complete by: As directed    Increase activity slowly   Complete by: As directed      Allergies as of 08/10/2019      Reactions   Celebrex [celecoxib]    Blood in stool      Medication List    STOP taking these medications   chlorthalidone 50 MG tablet Commonly known as: HYGROTON   ramipril 10 MG capsule Commonly known as: ALTACE     TAKE these medications   albuterol 108 (90 Base) MCG/ACT inhaler Commonly known as: VENTOLIN HFA INHALE 2 PUFFS EVERY 4 HOURS AS NEEDED WHEEZING What changed:   how much to take  how to take this  when to take this  reasons to take this  additional instructions   albuterol (2.5 MG/3ML) 0.083% nebulizer solution Commonly known as: PROVENTIL Take 3 mLs (2.5 mg total) by nebulization every 6 (six) hours as needed for wheezing or shortness of breath. What changed: Another medication with the same name was changed. Make sure you understand how and when to take each.  amLODipine 10 MG tablet Commonly known as: NORVASC TAKE 10 mg TABLET BY MOUTH EVERY DAY What changed:   how much to take  how to take this  when to take this  additional instructions   Caltrate 600+D 600-400 MG-UNIT tablet Generic drug: Calcium Carbonate-Vitamin D Take 1 tablet by mouth 2 (two) times daily.   ciprofloxacin 500 MG tablet Commonly known as: Cipro Take 1 tablet  (500 mg total) by mouth 2 (two) times daily for 3 days.   E-Z Spacer inhaler Use as instructed   Eliquis 5 MG Tabs tablet Generic drug: apixaban TAKE 1 TABLET BY MOUTH TWICE A DAY What changed: how much to take   Fish Oil 1000 MG Caps Take by mouth 2 (two) times daily.   fluticasone 110 MCG/ACT inhaler Commonly known as: Flovent HFA Inhale 2 puffs into the lungs 2 (two) times daily.   fluticasone 50 MCG/ACT nasal spray Commonly known as: FLONASE Place 1 spray into both nostrils daily. What changed:   when to take this  reasons to take this   methimazole 5 MG tablet Commonly known as: TAPAZOLE TAKE 1 TABLET BY MOUTH EVERY DAY   multivitamin tablet Take 1 tablet by mouth daily.   Vitamin D-3 125 MCG (5000 UT) Tabs Take 5,000 Units by mouth daily.      Follow-up Information    Zehr, Laban Emperor, PA-C Follow up on 09/06/2019.   Specialty: Gastroenterology Why: 8:30 am  Contact information: [place] [id number] [phone]          Allergies  Allergen Reactions  . Celebrex [Celecoxib]     Blood in stool    Consultations:  GI   Procedures/Studies: Ct Abdomen Pelvis W Contrast  Result Date: 08/08/2019 CLINICAL DATA:  Severe abdominal cramping. EXAM: CT ABDOMEN AND PELVIS WITH CONTRAST TECHNIQUE: Multidetector CT imaging of the abdomen and pelvis was performed using the standard protocol following bolus administration of intravenous contrast. CONTRAST:  19mL OMNIPAQUE IOHEXOL 300 MG/ML  SOLN COMPARISON:  None. 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 Pancreas: Unremarkable. Spleen: Unremarkable. Adrenals/Urinary Tract: Negative adrenals. No hydronephrosis or stone. Right pelviectasis on portal venous phase becomes more symmetric on delayed phase. No evidence of underlying obstructive process. Small right renal cystic density. Unremarkable  bladder. Stomach/Bowel: No obstruction. Stool seen throughout most of the colon without wall thickening or rectal over distension. Minimal small bowel mesenteric fat haziness, likely incidental in isolation. The appendix is not seen. No pericecal inflammation. Vascular/Lymphatic: No acute vascular abnormality. Atherosclerotic plaque. No mass or adenopathy. Reproductive:Negative Other: No ascites or pneumoperitoneum. Fatty periumbilical and right inguinal hernias. Musculoskeletal: No acute abnormalities. IMPRESSION: 1. No acute finding. 2. Stool is present throughout most of the colon. 3. Umbilical and right inguinal fatty hernias. Electronically Signed   By: Monte Fantasia M.D.   On: 08/08/2019 05:22     Subjective: Feeling very well, denies abdominal pain   Discharge Exam: Vitals:   08/10/19 0442 08/10/19 0741  BP: 139/70   Pulse: 70   Resp: 16   Temp: 98.2 F (36.8 C)   SpO2: 98% 97%     General: Pt is alert, awake, not in acute distress Cardiovascular: RRR, S1/S2 +, no rubs, no gallops Respiratory: CTA bilaterally, no wheezing, no rhonchi Abdominal: Soft, NT, ND, bowel sounds + Extremities: no edema, no cyanosis    The results of significant diagnostics from this hospitalization (  including imaging, microbiology, ancillary and laboratory) are listed below for reference.     Microbiology: Recent Results (from the past 240 hour(s))  SARS Coronavirus 2 [facility] order, Performed in [facility] hospital lab) Nasopharyngeal Nasopharyngeal Swab     Status: None   Collection Time: 08/08/19  8:02 AM   Specimen: Nasopharyngeal Swab  Result Value Ref Range Status   SARS Coronavirus 2 NEGATIVE NEGATIVE Final    Comment: (NOTE) If result is NEGATIVE SARS-CoV-2 target nucleic acids are NOT DETECTED. The SARS-CoV-2 RNA is generally detectable in upper and lower  respiratory specimens during the acute phase of infection. The lowest  concentration of SARS-CoV-2 viral copies this assay  can detect is 250  copies / mL. A negative result does not preclude SARS-CoV-2 infection  and should not be used as the sole basis for treatment or other  patient management decisions.  A negative result may occur with  improper specimen collection / handling, submission of specimen other  than nasopharyngeal swab, presence of viral mutation(s) within the  areas targeted by this assay, and inadequate number of viral copies  (<250 copies / mL). A negative result must be combined with clinical  observations, patient history, and epidemiological information. If result is POSITIVE SARS-CoV-2 target nucleic acids are DETECTED. The SARS-CoV-2 RNA is generally detectable in upper and lower  respiratory specimens dur ing the acute phase of infection.  Positive  results are indicative of active infection with SARS-CoV-2.  Clinical  correlation with patient history and other diagnostic information is  necessary to determine patient infection status.  Positive results do  not rule out bacterial infection or co-infection with other viruses. If result is PRESUMPTIVE POSTIVE SARS-CoV-2 nucleic acids MAY BE PRESENT.   A presumptive positive result was obtained on the submitted specimen  and confirmed on repeat testing.  While 2019 novel coronavirus  (SARS-CoV-2) nucleic acids may be present in the submitted sample  additional confirmatory testing may be necessary for epidemiological  and / or clinical management purposes  to differentiate between  SARS-CoV-2 and other Sarbecovirus currently known to infect humans.  If clinically indicated additional testing with an alternate test  methodology [phone]) is advised. The SARS-CoV-2 RNA is generally  detectable in upper and lower respiratory sp ecimens during the acute  phase of infection. The expected result is Negative. Fact Sheet for Patients:  StrictlyIdeas.no Fact Sheet for Healthcare  Providers: BankingDealers.co.za This test is not yet approved or cleared by the [place] FDA and has been authorized for detection and/or diagnosis of SARS-CoV-2 by FDA under an Emergency Use Authorization (EUA).  This EUA will remain in effect (meaning this test can be used) for the duration of the COVID-19 declaration under Section 564(b)(1) of the Act, 21 U.S.C. section 360bbb-3(b)(1), unless the authorization is terminated or revoked sooner. Performed at [facility], [place] [address], [place], [place] [address]      Labs: BNP (last 3 results) No results for input(s): BNP in the last 8760 hours. Basic Metabolic Panel: Recent Labs  Lab 08/08/19 0334 08/09/19 0538 08/10/19 0531  NA 140 137 136  K 2.8* 3.4* 3.6  CL 102 102 102  CO2 24 26 23   GLUCOSE 130* 122* 115*  BUN 25* 12 9  CREATININE 1.03 0.94 0.84  CALCIUM 9.8 8.9 8.5*  MG 2.2  --   --    Liver Function Tests: Recent Labs  Lab 08/08/19 0334 08/09/19 0538 08/10/19 0531  AST 38 22 23  ALT  43 31 25  ALKPHOS 50 45 46  BILITOT 0.9 1.6* 1.2  PROT 7.5 6.7 6.6  ALBUMIN 4.5 4.0 3.5   Recent Labs  Lab 08/08/19 0334  LIPASE 29   No results for input(s): AMMONIA in the last 168 hours. CBC: Recent Labs  Lab 08/08/19 0334 08/08/19 0554 08/08/19 0800 08/08/19 1554 08/09/19 0538 08/10/19 0531  WBC 15.9*  --   --   --  17.7* 19.5*  HGB 16.6 15.5 16.8 15.2 15.2 14.9  HCT 48.3 46.2 48.3 46.2 44.7 44.9  MCV 88.8  --   --   --  90.1 91.1  PLT 358  --   --   --  301 296   Cardiac Enzymes: No results for input(s): CKTOTAL, CKMB, CKMBINDEX, TROPONINI in the last 168 hours. BNP: Invalid input(s): POCBNP CBG: No results for input(s): GLUCAP in the last 168 hours. D-Dimer No results for input(s): DDIMER in the last 72 hours. Hgb A1c Recent Labs    08/08/19 0800  HGBA1C 5.5   Lipid Profile No results for input(s): CHOL, HDL, LDLCALC, TRIG, CHOLHDL, LDLDIRECT in  the last 72 hours. Thyroid function studies No results for input(s): TSH, T4TOTAL, T3FREE, THYROIDAB in the last 72 hours.  Invalid input(s): FREET3 Anemia work up No results for input(s): VITAMINB12, FOLATE, FERRITIN, TIBC, IRON, RETICCTPCT in the last 72 hours. Urinalysis    Component Value Date/Time   COLORURINE YELLOW 08/08/2019 0334   APPEARANCEUR CLEAR 08/08/2019 0334   LABSPEC 1.013 08/08/2019 0334   PHURINE 7.0 08/08/2019 0334   GLUCOSEU NEGATIVE 08/08/2019 0334   HGBUR NEGATIVE 08/08/2019 0334   BILIRUBINUR NEGATIVE 08/08/2019 0334   KETONESUR NEGATIVE 08/08/2019 0334   PROTEINUR NEGATIVE 08/08/2019 0334   NITRITE NEGATIVE 08/08/2019 0334   LEUKOCYTESUR NEGATIVE 08/08/2019 0334   Sepsis Labs Invalid input(s): PROCALCITONIN,  WBC,  LACTICIDVEN Microbiology Recent Results (from the past 240 hour(s))  SARS Coronavirus 2 [facility] order, Performed in [facility] hospital lab) Nasopharyngeal Nasopharyngeal Swab     Status: None   Collection Time: 08/08/19  8:02 AM   Specimen: Nasopharyngeal Swab  Result Value Ref Range Status   SARS Coronavirus 2 NEGATIVE NEGATIVE Final    Comment: (NOTE) If result is NEGATIVE SARS-CoV-2 target nucleic acids are NOT DETECTED. The SARS-CoV-2 RNA is generally detectable in upper and lower  respiratory specimens during the acute phase of infection. The lowest  concentration of SARS-CoV-2 viral copies this assay can detect is 250  copies / mL. A negative result does not preclude SARS-CoV-2 infection  and should not be used as the sole basis for treatment or other  patient management decisions.  A negative result may occur with  improper specimen collection / handling, submission of specimen other  than nasopharyngeal swab, presence of viral mutation(s) within the  areas targeted by this assay, and inadequate number of viral copies  (<250 copies / mL). A negative result must be combined with clinical  observations, patient history, and  epidemiological information. If result is POSITIVE SARS-CoV-2 target nucleic acids are DETECTED. The SARS-CoV-2 RNA is generally detectable in upper and lower  respiratory specimens dur ing the acute phase of infection.  Positive  results are indicative of active infection with SARS-CoV-2.  Clinical  correlation with patient history and other diagnostic information is  necessary to determine patient infection status.  Positive results do  not rule out bacterial infection or co-infection with other viruses. If result is PRESUMPTIVE POSTIVE SARS-CoV-2 nucleic acids MAY BE PRESENT.  A presumptive positive result was obtained on the submitted specimen  and confirmed on repeat testing.  While 2019 novel coronavirus  (SARS-CoV-2) nucleic acids may be present in the submitted sample  additional confirmatory testing may be necessary for epidemiological  and / or clinical management purposes  to differentiate between  SARS-CoV-2 and other Sarbecovirus currently known to infect humans.  If clinically indicated additional testing with an alternate test  methodology [phone]) is advised. The SARS-CoV-2 RNA is generally  detectable in upper and lower respiratory sp ecimens during the acute  phase of infection. The expected result is Negative. Fact Sheet for Patients:  StrictlyIdeas.no Fact Sheet for Healthcare Providers: BankingDealers.co.za This test is not yet approved or cleared by the [place] FDA and has been authorized for detection and/or diagnosis of SARS-CoV-2 by FDA under an Emergency Use Authorization (EUA).  This EUA will remain in effect (meaning this test can be used) for the duration of the COVID-19 declaration under Section 564(b)(1) of the Act, 21 U.S.C. section 360bbb-3(b)(1), unless the authorization is terminated or revoked sooner. Performed at [facility], [place] [address], [place],  [address]       Time coordinating discharge: 40 minutes  SIGNED:   Elmarie Shiley, MD  [facility]

## 2019-08-10 NOTE — Progress Notes (Addendum)
[facility] Progress Note  CC:  GI bleed, ischemic colitis  Subjective:  Feels great.  Had 4 BMs, the last two with no blood.  No abdominal pain.  WBC count 19.5 this AM, however.  Objective:  Vital signs in last 24 hours: Temp:  [98 F (36.7 C)-98.9 F (37.2 C)] 98.2 F (36.8 C) (08/14 0442) Pulse Rate:  [70-78] 70 (08/14 0442) Resp:  [15-16] 16 (08/14 0442) BP: (139-158)/(70-82) 139/70 (08/14 0442) SpO2:  [97 %-100 %] 97 % (08/14 0741) Weight:  [98.3 kg] 98.3 kg (08/14 0442) Last BM Date: 08/09/19 General:  Alert, Well-developed, in NAD Heart:  Regular rate and rhythm; no murmurs Pulm:  CTAB.  No increased WOB. Abdomen:  Soft, non-distended.  BS present.  Non-tender.  Umbilical hernia noted. Extremities:  Without edema. Neurologic:  Alert and oriented x 4;  grossly normal neurologically. Psych:  Alert and cooperative. Normal mood and affect.  Intake/Output from previous day: 08/13 0701 - 08/14 0700 In: 2380.6 [P.O.:720; I.V.:1234.1; IV Piggyback:426.5] Out: 1950 [Urine:1950]  Lab Results: Recent Labs    08/08/19 0334  08/08/19 1554 08/09/19 0538 08/10/19 0531  WBC 15.9*  --   --  17.7* 19.5*  HGB 16.6   < > 15.2 15.2 14.9  HCT 48.3   < > 46.2 44.7 44.9  PLT 358  --   --  301 296   < > = values in this interval not displayed.   BMET Recent Labs    08/08/19 0334 08/09/19 0538 08/10/19 0531  NA 140 137 136  K 2.8* 3.4* 3.6  CL 102 102 102  CO2 24 26 23   GLUCOSE 130* 122* 115*  BUN 25* 12 9  CREATININE 1.03 0.94 0.84  CALCIUM 9.8 8.9 8.5*   LFT Recent Labs    08/09/19 0538  PROT 6.7  ALBUMIN 4.0  AST 22  ALT 31  ALKPHOS 45  BILITOT 1.6*   Assessment / Plan: *68year old male with sudden onset severe crampy abdominal pain followed by bloody stool. CT scan negative but suspect this is ischemic colitis. Quite possible that it was too early or mild for CT scan to detect. Has leukocytosis, which is up even slightly higher today;  likely reactive. Hgb remains normal.  Clinically improved. *Hypokalemia: K+ 3.6 today. *Leiden Factor 5 with history of PE on Eliquis:On hold for now.  **Will advance diet to soft, which should be continued for the next couple of weeks. **Can still be discharged today despite increase in WBC count.  ? Reactive.  Will recheck CBC as outpatient next week.   **Please discharge him with 3 more days of Cipro. **Ok to resume Eliquis now. **Has F/U OV with me listed in discharge instructions.   LOS: 1 day   Laban Emperor. Zehr  08/10/2019, 9:11 AM  GI ATTENDING  Interval history data reviewed.  Patient personally seen and examined.  Patient known to me from prior colonoscopies, most recently November 2019.  Presents with classic acute ischemic colitis.  This problem has resolved clinically with resolution of pain and bleeding.  Still with moderate elevation of white blood cell count is noted.  At this point advance diet as tolerated.  Gradual but not vigorous activity.  Remain well-hydrated.  Okay to resume Eliquis.  Would out on ciprofloxacin for 3 days.  Follow-up in my office to be arranged.  I told him to contact the office in the interim if he has any questions or problems.  Docia Chuck.  Geri Seminole., M.D. [facility]

## 2019-08-22 ENCOUNTER — Other Ambulatory Visit: Payer: Self-pay

## 2019-08-22 ENCOUNTER — Ambulatory Visit (INDEPENDENT_AMBULATORY_CARE_PROVIDER_SITE_OTHER): Payer: Medicare HMO | Admitting: Family Medicine

## 2019-08-22 ENCOUNTER — Encounter: Payer: Self-pay | Admitting: Family Medicine

## 2019-08-22 VITALS — BP 100/78 | HR 75 | Temp 98.4°F | Ht 74.0 in | Wt 214.8 lb

## 2019-08-22 DIAGNOSIS — E785 Hyperlipidemia, unspecified: Secondary | ICD-10-CM | POA: Diagnosis not present

## 2019-08-22 DIAGNOSIS — I10 Essential (primary) hypertension: Secondary | ICD-10-CM

## 2019-08-22 DIAGNOSIS — D72829 Elevated white blood cell count, unspecified: Secondary | ICD-10-CM | POA: Diagnosis not present

## 2019-08-22 DIAGNOSIS — E876 Hypokalemia: Secondary | ICD-10-CM

## 2019-08-22 DIAGNOSIS — D6851 Activated protein C resistance: Secondary | ICD-10-CM | POA: Diagnosis not present

## 2019-08-22 DIAGNOSIS — K55039 Acute (reversible) ischemia of large intestine, extent unspecified: Secondary | ICD-10-CM | POA: Diagnosis not present

## 2019-08-22 DIAGNOSIS — Z23 Encounter for immunization: Secondary | ICD-10-CM | POA: Diagnosis not present

## 2019-08-22 DIAGNOSIS — K429 Umbilical hernia without obstruction or gangrene: Secondary | ICD-10-CM | POA: Diagnosis not present

## 2019-08-22 LAB — BASIC METABOLIC PANEL
BUN: 16 mg/dL (ref 6–23)
CO2: 28 mEq/L (ref 19–32)
Calcium: 9.9 mg/dL (ref 8.4–10.5)
Chloride: 98 mEq/L (ref 96–112)
Creatinine, Ser: 0.97 mg/dL (ref 0.40–1.50)
GFR: 77.4 mL/min (ref >60.00)
Glucose, Bld: 81 mg/dL (ref 70–99)
Potassium: 3.6 mEq/L (ref 3.5–5.1)
Sodium: 137 mEq/L (ref 135–145)

## 2019-08-22 LAB — CBC WITH DIFFERENTIAL/PLATELET
Basophils Absolute: 0.1 10*3/uL (ref 0.0–0.1)
Basophils Relative: 0.9 % (ref 0.0–3.0)
Eosinophils Absolute: 0.1 10*3/uL (ref 0.0–0.7)
Eosinophils Relative: 1.1 % (ref 0.0–5.0)
HCT: 46.1 % (ref 39.0–52.0)
Hemoglobin: 15.9 g/dL (ref 13.0–17.0)
Lymphocytes Relative: 40.3 % (ref 12.0–46.0)
Lymphs Abs: 4.7 10*3/uL — ABNORMAL HIGH (ref 0.7–4.0)
MCHC: 34.6 g/dL (ref 30.0–36.0)
MCV: 89.2 fl (ref 78.0–100.0)
Monocytes Absolute: 0.9 10*3/uL (ref 0.1–1.0)
Monocytes Relative: 7.6 % (ref 3.0–12.0)
Neutro Abs: 5.9 10*3/uL (ref 1.4–7.7)
Neutrophils Relative %: 50.1 % (ref 43.0–77.0)
Platelets: 454 10*3/uL — ABNORMAL HIGH (ref 150.0–400.0)
RBC: 5.16 Mil/uL (ref 4.22–5.81)
RDW: 13.7 % (ref 11.5–15.5)
WBC: 11.7 10*3/uL — ABNORMAL HIGH (ref 4.0–10.5)

## 2019-08-22 MED ORDER — APIXABAN 5 MG PO TABS: 5.0000 mg | ORAL_TABLET | Freq: Two times a day (BID) | ORAL | 3 refills | Status: DC

## 2019-08-22 NOTE — Progress Notes (Signed)
Matthew Trujillo DOB: [birth year] Encounter date: 08/22/2019  This is a 68 y.o. male who presents with Chief Complaint  Patient presents with  . Hospitalization Follow-up    History of present illness: Feeling pretty well.   Admitted 8/12-8/14 for ischemic colitis. Took wife to hospital on Tuesday for back surgery and then weds morning he ended up in ER with GI bleed and abdominal pain.   GI tract is doing as expected. When he first got home had watery coffee ground stools and has gradually become more firm, less watery. First couple of days eating rice, mashed potatoes, bread. Adding in more veggies and tolerating these along with pbj, eggs, deli meat. Having baked salmon tonight. Avoiding nuts, popcorn, peppers. NO red meat. Little dairy. Bowel movements are still soft, but are getting more normal looking at this point.   Strength is coming back. Was walking 35 miles/week pre-hospital. Only 7-9 now; just working on getting this back.   No fevers at home. No nausea since being home. No bright red blood in stool.    Allergies  Allergen Reactions  . Celebrex [Celecoxib]     Blood in stool   Current Meds  Medication Sig  . albuterol (PROVENTIL HFA;VENTOLIN HFA) 108 (90 Base) MCG/ACT inhaler INHALE 2 PUFFS EVERY 4 HOURS AS NEEDED WHEEZING (Patient taking differently: Inhale 2 puffs into the lungs every 4 (four) hours as needed for wheezing. )  . albuterol (PROVENTIL) (2.5 MG/3ML) 0.083% nebulizer solution Take 3 mLs (2.5 mg total) by nebulization every 6 (six) hours as needed for wheezing or shortness of breath.  Marland Kitchen amLODipine (NORVASC) 10 MG tablet TAKE 10 mg TABLET BY MOUTH EVERY DAY (Patient taking differently: Take 10 mg by mouth daily. )  . apixaban (ELIQUIS) 5 MG TABS tablet Take 1 tablet (5 mg total) by mouth 2 (two) times daily.  . Calcium Carbonate-Vitamin D (CALTRATE 600+D) 600-400 MG-UNIT per tablet Take 1 tablet by mouth 2 (two) times daily.   . Cholecalciferol (VITAMIN D-3)  5000 units TABS Take 5,000 Units by mouth daily.  . fluticasone (FLONASE) 50 MCG/ACT nasal spray Place 1 spray into both nostrils daily. (Patient taking differently: Place 1 spray into both nostrils daily as needed for allergies. )  . fluticasone (FLOVENT HFA) 110 MCG/ACT inhaler Inhale 2 puffs into the lungs 2 (two) times daily.  . methimazole (TAPAZOLE) 5 MG tablet TAKE 1 TABLET BY MOUTH EVERY DAY (Patient taking differently: Take 5 mg by mouth daily. )  . Multiple Vitamin (MULTIVITAMIN) tablet Take 1 tablet by mouth daily.    . Omega-3 Fatty Acids (FISH OIL) 1000 MG CAPS Take by mouth 2 (two) times daily.   Marland Kitchen Spacer/Aero-Holding Chambers (E-Z SPACER) inhaler Use as instructed  . [DISCONTINUED] ELIQUIS 5 MG TABS tablet TAKE 1 TABLET BY MOUTH TWICE A DAY (Patient taking differently: Take 5 mg by mouth 2 (two) times daily. )    Review of Systems  Constitutional: Positive for fatigue (improving). Negative for chills and fever.  Respiratory: Negative for cough, chest tightness, shortness of breath and wheezing.   Cardiovascular: Negative for chest pain, palpitations and leg swelling.    Objective:  BP 100/78 (BP Location: Left Arm, Patient Position: Sitting, Cuff Size: Normal)   Pulse 75   Temp 98.4 F (36.9 C) (Temporal)   Ht 6\' 2"  (1.88 m)   Wt 214 lb 12.8 oz (97.4 kg)   SpO2 97%   BMI 27.58 kg/m   Weight: 214 lb 12.8 oz (97.4 kg)  BP Readings from Last 3 Encounters:  08/22/19 100/78  08/10/19 139/70  05/07/19 138/66   Wt Readings from Last 3 Encounters:  08/22/19 214 lb 12.8 oz (97.4 kg)  08/10/19 216 lb 11.4 oz (98.3 kg)  05/07/19 222 lb (100.7 kg)    Physical Exam Constitutional:      General: He is not in acute distress.    Appearance: He is well-developed.  Cardiovascular:     Rate and Rhythm: Normal rate and regular rhythm.     Heart sounds: Normal heart sounds. No murmur. No friction rub.  Pulmonary:     Effort: Pulmonary effort is normal. No respiratory  distress.     Breath sounds: Normal breath sounds. No wheezing or rales.  Abdominal:     General: Abdomen is flat. Bowel sounds are normal.     Palpations: Abdomen is soft.     Tenderness: There is no abdominal tenderness. There is no guarding or rebound. Negative signs include Murphy's sign, Rovsing's sign, McBurney's sign, psoas sign and obturator sign.     Hernia: A hernia is present. Hernia is present in the umbilical area (3cm, reducible).  Musculoskeletal:     Right lower leg: No edema.     Left lower leg: No edema.  Neurological:     Mental Status: He is alert and oriented to person, place, and time.  Psychiatric:        Behavior: Behavior normal.     Assessment/Plan  1. Hypokalemia Will recheck bloodwork - Basic metabolic panel; Future - Basic metabolic panel  2. Leukocytosis, unspecified type Rechecking and will follow until normal. - CBC with Differential/Platelet; Future - CBC with Differential/Platelet  3. Umbilical hernia without obstruction and without gangrene Starting to have some intermittent tenderness.  - Ambulatory referral to General Surgery  4. Factor V Leiden ([place]) Restarted after discharge from hospital. Cost is issue for him. rx printed for price checking/shopping. Income is too high to qualify for assistance.  - apixaban (ELIQUIS) 5 MG TABS tablet; Take 1 tablet (5 mg total) by mouth 2 (two) times daily.  Dispense: 180 tablet; Refill: 3  5. Acute ischemic colitis ([place]) Has done well since episode - no longer having pain.   6. Need for pneumococcal vaccination - Pneumococcal polysaccharide vaccine 23-valent greater than or equal to 2yo subcutaneous/IM  7. Need for immunization against influenza - Flu Vaccine QUAD High Dose(Fluad)  Return in about 4 months (around 12/22/2019) for Chronic condition visit.      Micheline Rough, MD

## 2019-08-24 NOTE — Addendum Note (Signed)
Addended by: Agnes Lawrence on: 08/24/2019 11:49 AM   Modules accepted: Orders

## 2019-09-05 ENCOUNTER — Encounter: Payer: Self-pay | Admitting: *Deleted

## 2019-09-06 ENCOUNTER — Encounter: Payer: Self-pay | Admitting: Gastroenterology

## 2019-09-06 ENCOUNTER — Other Ambulatory Visit: Payer: Self-pay

## 2019-09-06 ENCOUNTER — Ambulatory Visit (INDEPENDENT_AMBULATORY_CARE_PROVIDER_SITE_OTHER): Payer: Medicare HMO | Admitting: Gastroenterology

## 2019-09-06 VITALS — BP 118/64 | HR 75 | Temp 97.4°F | Ht 74.0 in | Wt 217.0 lb

## 2019-09-06 DIAGNOSIS — K559 Vascular disorder of intestine, unspecified: Secondary | ICD-10-CM

## 2019-09-06 NOTE — Patient Instructions (Addendum)
If you are age 66 or older, your body mass index should be between 23-30. Your Body mass index is 27.86 kg/m. If this is out of the aforementioned range listed, please consider follow up with your Primary Care Provider.  If you are age 19 or younger, your body mass index should be between 19-25. Your Body mass index is 27.86 kg/m. If this is out of the aformentioned range listed, please consider follow up with your Primary Care Provider.   Follow up as needed.  Thank you for choosing me and [facility]  Alonza Bogus, PA-C

## 2019-09-06 NOTE — Progress Notes (Addendum)
09/06/2019 Matthew Trujillo [id number] 05/04/1951   HISTORY OF PRESENT ILLNESS: This is a pleasant 68 year old male who is a patient of Dr. Blanch Media.  He was recently hospitalized from August 12 to August 14 for an episode of suspected ischemic colitis.  Had sudden onset severe abdominal cramping and bloody diarrhea.  CT scan did not confirm this, but this was still our suspicion due to timing with onset of symptoms, etc.  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  That was held during his hospitalization.  He improved rapidly and was discharged from the hospital on August 14.  He still had a leukocytosis at that time.  He has already followed up with his PCP and repeat labs on August 26 revealed white blood cell count down to 11.7 at that time.  He tells me that he is feeling great.  Having normal bowel movements.  Has not seen any blood in his stools since his discharge from the hospital.  No abdominal pain.  He is about 95% back to his regular diet eating salads, etc.  He is back on his Eliquis.  Colonoscopy by Dr. Henrene Pastor 10/2018 showed a 4 mm polyp that was performed, but was otherwise unremarkable.     Past Medical History:  Diagnosis Date  . ALLERGIC RHINITIS 05/21/2010  . ASTHMA 05/21/2010  . Factor V Leiden ([place])   . Hx of adenomatous colonic polyps   . HYPERTENSION 05/21/2010   Diffiult to control  . Inguinal hernia   . Ischemic colitis ([place])   . Lower GI bleed   . Prolonged QT interval   . PULMONARY EMBOLISM 05/21/2010  . Umbilical hernia    Past Surgical History:  Procedure Laterality Date  . ACHILLES TENDON REPAIR    . COLONOSCOPY     last 2019, multiple  . DOPPLER ECHOCARDIOGRAPHY  12/30/2011   EF >55%; MILD LVH,MILD LAE,NORMAL DIASTOLIC FUNCTION  . RENAL DOPPLER  12/30/2011   NORMAL RENAL    reports that he has never smoked. He has never used smokeless tobacco. He reports current alcohol use. He reports that he does not use drugs. family history  includes Hypertension in his mother; Hypothyroidism in his mother and sister; Kidney failure in his father; Pancreatic cancer in his brother; Renal cancer in his father. Allergies  Allergen Reactions  . Celebrex [Celecoxib]     Blood in stool      Outpatient Encounter Medications as of 09/06/2019  Medication Sig  . albuterol (PROVENTIL HFA;VENTOLIN HFA) 108 (90 Base) MCG/ACT inhaler INHALE 2 PUFFS EVERY 4 HOURS AS NEEDED WHEEZING (Patient taking differently: Inhale 2 puffs into the lungs every 4 (four) hours as needed for wheezing. )  . albuterol (PROVENTIL) (2.5 MG/3ML) 0.083% nebulizer solution Take 3 mLs (2.5 mg total) by nebulization every 6 (six) hours as needed for wheezing or shortness of breath.  Marland Kitchen amLODipine (NORVASC) 10 MG tablet TAKE 10 mg TABLET BY MOUTH EVERY DAY (Patient taking differently: Take 10 mg by mouth daily. )  . apixaban (ELIQUIS) 5 MG TABS tablet Take 1 tablet (5 mg total) by mouth 2 (two) times daily.  . Calcium Carbonate-Vitamin D (CALTRATE 600+D) 600-400 MG-UNIT per tablet Take 1 tablet by mouth 2 (two) times daily.   . Cholecalciferol (VITAMIN D-3) 5000 units TABS Take 5,000 Units by mouth daily.  . fluticasone (FLONASE) 50 MCG/ACT nasal spray Place 1 spray into both nostrils daily. (Patient taking differently: Place 1 spray into both  nostrils as needed for allergies. )  . fluticasone (FLOVENT HFA) 110 MCG/ACT inhaler Inhale 2 puffs into the lungs 2 (two) times daily.  . methimazole (TAPAZOLE) 5 MG tablet TAKE 1 TABLET BY MOUTH EVERY DAY (Patient taking differently: Take 5 mg by mouth daily. )  . Multiple Vitamin (MULTIVITAMIN) tablet Take 1 tablet by mouth daily.    . Omega-3 Fatty Acids (FISH OIL) 1000 MG CAPS Take by mouth 2 (two) times daily.   Marland Kitchen Spacer/Aero-Holding Chambers (E-Z SPACER) inhaler Use as instructed   No facility-administered encounter medications on file as of 09/06/2019.      REVIEW OF SYSTEMS  : All other systems reviewed and negative except  where noted in the History of Present Illness.   PHYSICAL EXAM: BP 118/64   Pulse 75   Temp (!) 97.4 F (36.3 C)   Ht 6\' 2"  (1.88 m)   Wt 217 lb (98.4 kg)   BMI 27.86 kg/m  General: Well developed white male in no acute distress Head: Normocephalic and atraumatic Eyes:  Sclerae anicteric, conjunctiva pink. Ears: Normal auditory acuity Lungs: Clear throughout to auscultation; no increased WOB Heart: Regular rate and rhythm; no M/R/G. Abdomen: Soft, non-distended.  BS present.  Non-tender.  Umbilical hernia noted with some TTP but reducible. Musculoskeletal: Symmetrical with no gross deformities  Skin: No lesions on visible extremities Extremities: No edema  Neurological: Alert oriented x 4, grossly non-focal Psychological:  Alert and cooperative. Normal mood and affect  ASSESSMENT AND PLAN: *68year old male here for hospital follow-up of ischemic colitis.  Doing well.   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  -Advised to remain well hydrated.   -Follow-up prn.  CC:  Caren Macadam, MD

## 2019-09-06 NOTE — Progress Notes (Signed)
Agree with assessment and plans 

## 2019-09-12 ENCOUNTER — Other Ambulatory Visit: Payer: Self-pay

## 2019-09-12 MED ORDER — AMLODIPINE BESYLATE 10 MG PO TABS: ORAL_TABLET | ORAL | 2 refills | Status: DC

## 2019-09-13 ENCOUNTER — Telehealth: Payer: Self-pay

## 2019-09-13 NOTE — Telephone Encounter (Signed)
Refill

## 2019-09-14 ENCOUNTER — Other Ambulatory Visit: Payer: Self-pay | Admitting: Cardiology

## 2019-09-15 DIAGNOSIS — Z23 Encounter for immunization: Secondary | ICD-10-CM | POA: Diagnosis not present

## 2019-09-21 ENCOUNTER — Encounter: Payer: Self-pay | Admitting: Cardiology

## 2019-09-21 NOTE — Telephone Encounter (Signed)
Response to my chart question    ----- Message -----    From: [name]    Sent: 09/19/2019  1:53 PM EDT      To: Glenetta Hew, MD Subject: Non-Urgent Medical Question  Am I to stop taking Chlorthalidone?     Requested that [facility] ([address]) call for refills of Ramipril and Chlorthalidone.  [facility] said only Ramipril was approved.  Stop taking Chlorthalidone or a communication  error?  Response:  I did not have any to do with stopping the chlorthalidone.  The only reason I can imagine that it was held was because your potassium level was low.  This was something found by her primary physician.  I will check with their office to see if they stopped it.  The note did not mention it, but it would make sense.  Looks like you had some loose stool issues and that maybe my potassium went down.  Chlorthalidone can make your potassium get lower and they may want to stop it for that reason  For now we will just hold off on taking it until you check with your PCP.  Glenetta Hew, MD

## 2019-09-25 ENCOUNTER — Encounter: Payer: Self-pay | Admitting: Family Medicine

## 2019-09-27 ENCOUNTER — Other Ambulatory Visit: Payer: Self-pay | Admitting: Family Medicine

## 2019-09-27 MED ORDER — RAMIPRIL 10 MG PO CAPS: 10.0000 mg | ORAL_CAPSULE | Freq: Every day | ORAL | Status: DC

## 2019-09-27 MED ORDER — CHLORTHALIDONE 50 MG PO TABS: 50.0000 mg | ORAL_TABLET | Freq: Every day | ORAL | 1 refills | Status: DC

## 2019-10-01 ENCOUNTER — Other Ambulatory Visit: Payer: Self-pay | Admitting: Endocrinology

## 2019-10-01 DIAGNOSIS — E0500 Thyrotoxicosis with diffuse goiter without thyrotoxic crisis or storm: Secondary | ICD-10-CM

## 2019-10-16 DIAGNOSIS — J45909 Unspecified asthma, uncomplicated: Secondary | ICD-10-CM | POA: Diagnosis not present

## 2019-10-16 DIAGNOSIS — Z86718 Personal history of other venous thrombosis and embolism: Secondary | ICD-10-CM | POA: Diagnosis not present

## 2019-10-16 DIAGNOSIS — Z8249 Family history of ischemic heart disease and other diseases of the circulatory system: Secondary | ICD-10-CM | POA: Diagnosis not present

## 2019-10-16 DIAGNOSIS — E0500 Thyrotoxicosis with diffuse goiter without thyrotoxic crisis or storm: Secondary | ICD-10-CM | POA: Diagnosis not present

## 2019-10-16 DIAGNOSIS — Z823 Family history of stroke: Secondary | ICD-10-CM | POA: Diagnosis not present

## 2019-10-16 DIAGNOSIS — I10 Essential (primary) hypertension: Secondary | ICD-10-CM | POA: Diagnosis not present

## 2019-10-16 DIAGNOSIS — Z7901 Long term (current) use of anticoagulants: Secondary | ICD-10-CM | POA: Diagnosis not present

## 2019-10-16 DIAGNOSIS — Z79899 Other long term (current) drug therapy: Secondary | ICD-10-CM | POA: Diagnosis not present

## 2019-10-16 DIAGNOSIS — Z809 Family history of malignant neoplasm, unspecified: Secondary | ICD-10-CM | POA: Diagnosis not present

## 2019-10-18 ENCOUNTER — Encounter: Payer: Self-pay | Admitting: Endocrinology

## 2019-10-19 ENCOUNTER — Ambulatory Visit (INDEPENDENT_AMBULATORY_CARE_PROVIDER_SITE_OTHER): Payer: Medicare HMO | Admitting: Endocrinology

## 2019-10-19 ENCOUNTER — Other Ambulatory Visit: Payer: Self-pay

## 2019-10-19 DIAGNOSIS — E0590 Thyrotoxicosis, unspecified without thyrotoxic crisis or storm: Secondary | ICD-10-CM | POA: Diagnosis not present

## 2019-10-19 DIAGNOSIS — E0500 Thyrotoxicosis with diffuse goiter without thyrotoxic crisis or storm: Secondary | ICD-10-CM

## 2019-10-19 NOTE — Progress Notes (Signed)
Subjective:    Patient ID: Matthew Trujillo, male    DOB: October 27, 1951, 68 y.o.   MRN: [id number]  HPI telehealth visit today via doxy video visit.  Alternatives to telehealth are presented to this patient, and the patient agrees to the telehealth visit. Pt is advised of the cost of the visit, and agrees to this, also.   Patient is at home, and I am at the office.   Pt returns for f/u of hyperthyroidism (dx'ed with hyperthyroidism in 2017; US showed autoimmune thyroidtis; he declines RAI).  He takes tapazole 5 mg qd, as rx'ed.  pt states he feels well in general, except for slight tremor of the hands, but no assoc palpitations.  Past Medical History:  Diagnosis Date  . ALLERGIC RHINITIS 05/21/2010  . ASTHMA 05/21/2010  . Factor V Leiden ([place])   . Hx of adenomatous colonic polyps   . HYPERTENSION 05/21/2010   Diffiult to control  . Inguinal hernia   . Ischemic colitis ([place])   . Lower GI bleed   . Prolonged QT interval   . PULMONARY EMBOLISM 05/21/2010  . Umbilical hernia     Past Surgical History:  Procedure Laterality Date  . ACHILLES TENDON REPAIR    . COLONOSCOPY     last 2019, multiple  . DOPPLER ECHOCARDIOGRAPHY  12/30/2011   EF >55%; MILD LVH,MILD LAE,NORMAL DIASTOLIC FUNCTION  . RENAL DOPPLER  12/30/2011   NORMAL RENAL    Social History   Socioeconomic History  . Marital status: Married    Spouse name: Not on file  . Number of children: Not on file  . Years of education: Not on file  . Highest education level: Not on file  Occupational History  . Not on file  Social Needs  . Financial resource strain: Not on file  . Food insecurity    Worry: Not on file    Inability: Not on file  . Transportation needs    Medical: Not on file    Non-medical: Not on file  Tobacco Use  . Smoking status: Never Smoker  . Smokeless tobacco: Never Used  Substance and Sexual Activity  . Alcohol use: Yes    Comment: occasional   . Drug use: No  . Sexual activity: Not on file   Lifestyle  . Physical activity    Days per week: Not on file    Minutes per session: Not on file  . Stress: Not on file  Relationships  . Social Herbalist on phone: Not on file    Gets together: Not on file    Attends religious service: Not on file    Active member of club or organization: Not on file    Attends meetings of clubs or organizations: Not on file    Relationship status: Not on file  . Intimate partner violence    Fear of current or ex partner: Not on file    Emotionally abused: Not on file    Physically abused: Not on file    Forced sexual activity: Not on file  Other Topics Concern  . Not on file  Social History Narrative   He is a married father of 2. [employer] employee   He does exercise about 3-4 days a week with no true symptoms.    Does not smoke and occasional social alcohol.     Current Outpatient Medications on File Prior to Visit  Medication Sig Dispense Refill  . albuterol (PROVENTIL HFA;VENTOLIN HFA) 108 (  90 Base) MCG/ACT inhaler INHALE 2 PUFFS EVERY 4 HOURS AS NEEDED WHEEZING (Patient taking differently: Inhale 2 puffs into the lungs every 4 (four) hours as needed for wheezing. ) 8.5 Inhaler 2  . albuterol (PROVENTIL) (2.5 MG/3ML) 0.083% nebulizer solution Take 3 mLs (2.5 mg total) by nebulization every 6 (six) hours as needed for wheezing or shortness of breath. 75 mL 12  . amLODipine (NORVASC) 10 MG tablet TAKE 10 mg TABLET BY MOUTH EVERY DAY 90 tablet 2  . apixaban (ELIQUIS) 5 MG TABS tablet Take 1 tablet (5 mg total) by mouth 2 (two) times daily. 180 tablet 3  . Calcium Carbonate-Vitamin D (CALTRATE 600+D) 600-400 MG-UNIT per tablet Take 1 tablet by mouth 2 (two) times daily.     . chlorthalidone (HYGROTON) 50 MG tablet Take 1 tablet (50 mg total) by mouth daily. 90 tablet 1  . Cholecalciferol (VITAMIN D-3) 5000 units TABS Take 5,000 Units by mouth daily.    . fluticasone (FLONASE) 50 MCG/ACT nasal spray Place 1 spray into both nostrils  daily. (Patient taking differently: Place 1 spray into both nostrils as needed for allergies. ) 16 g 5  . fluticasone (FLOVENT HFA) 110 MCG/ACT inhaler Inhale 2 puffs into the lungs 2 (two) times daily. 1 Inhaler 12  . Multiple Vitamin (MULTIVITAMIN) tablet Take 1 tablet by mouth daily.      . Omega-3 Fatty Acids (FISH OIL) 1000 MG CAPS Take by mouth 2 (two) times daily.     . ramipril (ALTACE) 10 MG capsule Take 1 capsule (10 mg total) by mouth daily.    Marland Kitchen Spacer/Aero-Holding Chambers (E-Z SPACER) inhaler Use as instructed 1 each 2   No current facility-administered medications on file prior to visit.     Allergies  Allergen Reactions  . Celebrex [Celecoxib]     Blood in stool    Family History  Problem Relation Age of Onset  . Kidney failure Father        single kidney; hx of recurrent infections  . Renal cancer Father   . Pancreatic cancer Brother   . Hypertension Mother   . Hypothyroidism Mother   . Hypothyroidism Sister   . Heart disease Neg Hx   . Colon cancer Neg Hx   . Esophageal cancer Neg Hx   . Stomach cancer Neg Hx   . Rectal cancer Neg Hx      Review of Systems Denies fever.  He has lost a few lbs, due to his efforts.      Objective:   Physical Exam      Assessment & Plan:  Hyperthyroidism: due for recheck Tremor: uncertain if this is thyroid-related  Patient Instructions  blood tests are requested for you today.  We'll let you know about the results.   If ever you have fever while taking methimazole, stop it and call us, even if the reason is obvious, because of the risk of a rare side-effect.   Please come back for a follow-up appointment in 6 months.

## 2019-10-19 NOTE — Patient Instructions (Signed)
blood tests are requested for you today.  We'll let you know about the results. If ever you have fever while taking methimazole, stop it and call us, even if the reason is obvious, because of the risk of a rare side-effect. Please come back for a follow-up appointment in 6 months.   

## 2019-10-20 MED ORDER — METHIMAZOLE 5 MG PO TABS: 5.0000 mg | ORAL_TABLET | Freq: Every day | ORAL | 1 refills | Status: DC

## 2019-10-23 ENCOUNTER — Telehealth: Payer: Self-pay | Admitting: Family Medicine

## 2019-10-23 NOTE — Telephone Encounter (Signed)
Copied from [place] [phone]. Topic: General - Other >> Oct 23, 2019 11:57 AM Keene Breath wrote: Reason for CRM: Patient called to ask the doctor to put in an order to have his thyroid checked per Dr. Loanne Drilling.  Please call patient to discuss at [phone]

## 2019-10-24 ENCOUNTER — Other Ambulatory Visit: Payer: Self-pay | Admitting: Family Medicine

## 2019-10-24 DIAGNOSIS — E785 Hyperlipidemia, unspecified: Secondary | ICD-10-CM

## 2019-10-24 DIAGNOSIS — E0590 Thyrotoxicosis, unspecified without thyrotoxic crisis or storm: Secondary | ICD-10-CM

## 2019-10-24 DIAGNOSIS — I10 Essential (primary) hypertension: Secondary | ICD-10-CM

## 2019-10-24 DIAGNOSIS — E0500 Thyrotoxicosis with diffuse goiter without thyrotoxic crisis or storm: Secondary | ICD-10-CM

## 2019-10-24 NOTE — Telephone Encounter (Signed)
[place]; I have put in these orders along with mine. Please set up lab visit to be done at his convenience. I did add lipid to this. Doesn't have to do extended fast but at least a few hours. Water is ok.

## 2019-10-24 NOTE — Telephone Encounter (Signed)
I called the pt and informed him of the message below.  Lab appt was scheduled previously for 11/2.

## 2019-10-29 ENCOUNTER — Other Ambulatory Visit (INDEPENDENT_AMBULATORY_CARE_PROVIDER_SITE_OTHER): Payer: Medicare HMO

## 2019-10-29 ENCOUNTER — Other Ambulatory Visit: Payer: Self-pay

## 2019-10-29 DIAGNOSIS — E785 Hyperlipidemia, unspecified: Secondary | ICD-10-CM

## 2019-10-29 DIAGNOSIS — I10 Essential (primary) hypertension: Secondary | ICD-10-CM | POA: Diagnosis not present

## 2019-10-29 DIAGNOSIS — E0590 Thyrotoxicosis, unspecified without thyrotoxic crisis or storm: Secondary | ICD-10-CM | POA: Diagnosis not present

## 2019-10-29 LAB — COMPREHENSIVE METABOLIC PANEL
ALT: 35 U/L (ref 0–53)
AST: 27 U/L (ref 0–37)
Albumin: 4.7 g/dL (ref 3.5–5.2)
Alkaline Phosphatase: 58 U/L (ref 39–117)
BUN: 18 mg/dL (ref 6–23)
CO2: 27 mEq/L (ref 19–32)
Calcium: 9.7 mg/dL (ref 8.4–10.5)
Chloride: 97 mEq/L (ref 96–112)
Creatinine, Ser: 0.88 mg/dL (ref 0.40–1.50)
GFR: 86.55 mL/min (ref >60.00)
Glucose, Bld: 99 mg/dL (ref 70–99)
Potassium: 3.5 mEq/L (ref 3.5–5.1)
Sodium: 136 mEq/L (ref 135–145)
Total Bilirubin: 1 mg/dL (ref 0.2–1.2)
Total Protein: 7.2 g/dL (ref 6.0–8.3)

## 2019-10-29 LAB — CBC WITH DIFFERENTIAL/PLATELET
Basophils Absolute: 0.1 10*3/uL (ref 0.0–0.1)
Basophils Relative: 1.1 % (ref 0.0–3.0)
Eosinophils Absolute: 0.1 10*3/uL (ref 0.0–0.7)
Eosinophils Relative: 1.3 % (ref 0.0–5.0)
HCT: 45.3 % (ref 39.0–52.0)
Hemoglobin: 15.8 g/dL (ref 13.0–17.0)
Lymphocytes Relative: 41.1 % (ref 12.0–46.0)
Lymphs Abs: 4 10*3/uL (ref 0.7–4.0)
MCHC: 34.9 g/dL (ref 30.0–36.0)
MCV: 89.3 fl (ref 78.0–100.0)
Monocytes Absolute: 0.7 10*3/uL (ref 0.1–1.0)
Monocytes Relative: 7.2 % (ref 3.0–12.0)
Neutro Abs: 4.8 10*3/uL (ref 1.4–7.7)
Neutrophils Relative %: 49.3 % (ref 43.0–77.0)
Platelets: 361 10*3/uL (ref 150.0–400.0)
RBC: 5.07 Mil/uL (ref 4.22–5.81)
RDW: 14.3 % (ref 11.5–15.5)
WBC: 9.8 10*3/uL (ref 4.0–10.5)

## 2019-10-29 LAB — LIPID PANEL
Cholesterol: 205 mg/dL — ABNORMAL HIGH (ref 0–200)
HDL: 38.1 mg/dL — ABNORMAL LOW (ref >39.00)
LDL Cholesterol: 133 mg/dL — ABNORMAL HIGH (ref 0–99)
NonHDL: 166.83
Total CHOL/HDL Ratio: 5
Triglycerides: 168 mg/dL — ABNORMAL HIGH (ref 0.0–149.0)
VLDL: 33.6 mg/dL (ref 0.0–40.0)

## 2019-10-29 LAB — T4, FREE: Free T4: 0.87 ng/dL (ref 0.60–1.60)

## 2019-10-29 LAB — T3, FREE: T3, Free: 3.8 pg/mL (ref 2.3–4.2)

## 2019-10-29 LAB — TSH: TSH: 2.16 u[IU]/mL (ref 0.35–4.50)

## 2019-11-01 DIAGNOSIS — K429 Umbilical hernia without obstruction or gangrene: Secondary | ICD-10-CM | POA: Diagnosis not present

## 2019-11-08 DIAGNOSIS — H43393 Other vitreous opacities, bilateral: Secondary | ICD-10-CM | POA: Diagnosis not present

## 2019-11-08 DIAGNOSIS — H43813 Vitreous degeneration, bilateral: Secondary | ICD-10-CM | POA: Diagnosis not present

## 2019-11-13 ENCOUNTER — Other Ambulatory Visit: Payer: Self-pay | Admitting: Family Medicine

## 2019-11-13 DIAGNOSIS — J309 Allergic rhinitis, unspecified: Secondary | ICD-10-CM

## 2019-11-19 ENCOUNTER — Encounter: Payer: Self-pay | Admitting: Family Medicine

## 2019-11-19 ENCOUNTER — Other Ambulatory Visit: Payer: Self-pay

## 2019-11-19 ENCOUNTER — Ambulatory Visit (INDEPENDENT_AMBULATORY_CARE_PROVIDER_SITE_OTHER): Payer: Medicare HMO | Admitting: Family Medicine

## 2019-11-19 VITALS — BP 130/78 | HR 76 | Temp 98.0°F | Ht 74.0 in | Wt 219.3 lb

## 2019-11-19 DIAGNOSIS — D6851 Activated protein C resistance: Secondary | ICD-10-CM

## 2019-11-19 DIAGNOSIS — Z86711 Personal history of pulmonary embolism: Secondary | ICD-10-CM

## 2019-11-19 DIAGNOSIS — L989 Disorder of the skin and subcutaneous tissue, unspecified: Secondary | ICD-10-CM

## 2019-11-19 DIAGNOSIS — I10 Essential (primary) hypertension: Secondary | ICD-10-CM

## 2019-11-19 DIAGNOSIS — I82402 Acute embolism and thrombosis of unspecified deep veins of left lower extremity: Secondary | ICD-10-CM

## 2019-11-19 DIAGNOSIS — J4520 Mild intermittent asthma, uncomplicated: Secondary | ICD-10-CM | POA: Diagnosis not present

## 2019-11-19 DIAGNOSIS — E785 Hyperlipidemia, unspecified: Secondary | ICD-10-CM | POA: Diagnosis not present

## 2019-11-19 DIAGNOSIS — E0590 Thyrotoxicosis, unspecified without thyrotoxic crisis or storm: Secondary | ICD-10-CM

## 2019-11-19 NOTE — Progress Notes (Signed)
Matthew Trujillo Date of Birth:  [birth year]  This patient presents to the office today for a preoperativeconsultation at the request of surgeon, Dr. Marlou Starks, who plans on performing umbilical hernia repair on TBD .    Planned anesthesia: general   Known anesthesia problems: Negative   Bleeding risk: currently on eliquis; had GI bleed related to ischemic colitis but was on eliquis at that time. Did have GI bleeding on celebrex in the past.   Personal or FH of DVT/PE: yes; one DVT in 2011 after international flight; another in 2018 (fell asleep with leg in cramped position). Also had extremely low heart rate during the second DVT. Also has Factor V Leiden.   Occasionally checks bp at home. Last check was 132/79 2 weeks ago. Lowest he has seen was about 125; highest he has seen was in 140's. (at one point in past was on metoprolol but HR went down into the 40's with this)  Umbilical hernia is tender. Sometimes if taking long walk gets sore or if bumps against something. Sometimes pinching some.   3 week episode of depressed mood.   Patient Active Problem List   Diagnosis Date Noted  . GI bleed 08/08/2019  . Abdominal pain 08/08/2019  . Factor V Leiden ([place]) 08/08/2019  . Ischemic colitis ([place])   . 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 Hyperlipidemia with target LDL less than 100 05/04/2018  . High risk medication use 11/11/2017  . Graves disease 10/26/2017  . 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 Palpitations 09/01/2017  . Hyperthyroidism 08/30/2017  . Bilateral edema of lower extremity 08/30/2017  . Essential hypertension 05/21/2010  . History of pulmonary embolism 05/21/2010  . Allergic rhinitis 05/21/2010  . Asthma in adult, mild intermittent, uncomplicated 0000000   Past Surgical History:  Procedure Laterality Date  . ACHILLES TENDON REPAIR    . COLONOSCOPY     last 2019, multiple  . DOPPLER ECHOCARDIOGRAPHY  12/30/2011   EF >55%; MILD LVH,MILD LAE,NORMAL DIASTOLIC FUNCTION  . RENAL DOPPLER  12/30/2011   NORMAL RENAL    Allergies  Allergen Reactions  . Celebrex [Celecoxib]     Blood in stool   Current Meds  Medication Sig  . albuterol (PROVENTIL HFA;VENTOLIN HFA) 108 (90 Base) MCG/ACT inhaler INHALE 2 PUFFS EVERY 4 HOURS AS NEEDED WHEEZING (Patient taking differently: Inhale 2 puffs into the lungs every 4 (four) hours as needed for wheezing. )  . albuterol (PROVENTIL) (2.5 MG/3ML) 0.083% nebulizer solution Take 3 mLs (2.5 mg total) by nebulization every 6 (six) hours as needed for wheezing or shortness of breath.  Marland Kitchen amLODipine (NORVASC) 10 MG tablet TAKE 10 mg TABLET BY MOUTH EVERY DAY  . apixaban (ELIQUIS) 5 MG TABS tablet Take 1 tablet (5 mg total) by mouth 2 (two) times daily.  . Calcium Carbonate-Vitamin D (CALTRATE 600+D) 600-400 MG-UNIT per tablet Take 1 tablet by mouth 2 (two) times daily.   . chlorthalidone (HYGROTON) 50 MG tablet Take 1 tablet (50 mg total) by mouth daily.  . Cholecalciferol (VITAMIN D-3) 5000 units TABS Take 5,000 Units by mouth daily.  . fluticasone (FLONASE) 50 MCG/ACT nasal spray PLACE 1 SPRAY INTO BOTH NOSTRILS DAILY.  . fluticasone (FLOVENT HFA) 110 MCG/ACT inhaler Inhale 2 puffs into the lungs 2 (two) times daily.  . methimazole (TAPAZOLE) 5 MG tablet Take 1 tablet (5 mg total) by mouth daily.  . Multiple Vitamin (MULTIVITAMIN) tablet Take 1 tablet by mouth daily.    Marland Kitchen  Omega-3 Fatty Acids (FISH OIL) 1000 MG CAPS Take by mouth 2 (two) times daily.   . ramipril (ALTACE) 10 MG capsule Take 1 capsule (10 mg total) by mouth daily.  Marland Kitchen Spacer/Aero-Holding Chambers (E-Z SPACER) inhaler Use as instructed    Social History   Tobacco Use  . Smoking status: Never Smoker  . Smokeless tobacco: Never Used  Substance Use Topics  . Alcohol use: Yes    Comment: occasional    Family History  Problem Relation Age of Onset  . Kidney failure Father        single kidney; hx of  recurrent infections  . Renal cancer Father   . Pancreatic cancer Brother   . Hypertension Mother   . Hypothyroidism Mother   . Hypothyroidism Sister   . Heart disease Neg Hx   . Colon cancer Neg Hx   . Esophageal cancer Neg Hx   . Stomach cancer Neg Hx   . Rectal cancer Neg Hx     Review of Systems Review of Systems  Constitutional: Negative for fever and malaise/fatigue.  Respiratory: Negative for cough.   Cardiovascular: Negative for chest pain, palpitations, orthopnea, claudication and leg swelling.  Gastrointestinal: Negative for blood in stool, constipation and diarrhea. Abdominal pain: see hpi.  Genitourinary: Negative for dysuria.  Skin:       Has a sore on left elbow - just popped up in last couple of weeks. Feels that it is enlarging.  Psychiatric/Behavioral: Positive for depression (had a few weeks with very sad, lonely mood. Feels much better now. hasn't had this in past. ).    Recent Labs: CBC:  Lab Results  Component Value Date   WBC 9.8 10/29/2019   HGB 15.8 10/29/2019   HCT 45.3 10/29/2019   MCH 30.2 08/10/2019   MCHC 34.9 10/29/2019   RDW 14.3 10/29/2019   PLT 361.0 10/29/2019   CMP:  Lab Results  Component Value Date   NA 136 10/29/2019   K 3.5 10/29/2019   CL 97 10/29/2019   CO2 27 10/29/2019   ANIONGAP 11 08/10/2019   GLUCOSE 99 10/29/2019   BUN 18 10/29/2019   CREATININE 0.88 10/29/2019   GFRAA >60 08/10/2019   CALCIUM 9.7 10/29/2019   PROT 7.2 10/29/2019   BILITOT 1.0 10/29/2019   ALKPHOS 58 10/29/2019   ALT 35 10/29/2019   AST 27 10/29/2019    HBA1C: No results found for: LABA1C, EAG  Objective:   BP 130/78 (BP Location: Left Arm, Patient Position: Sitting, Cuff Size: Large)   Pulse 76   Temp 98 F (36.7 C) (Temporal)   Ht 6\' 2"  (1.88 m)   Wt 219 lb 4.8 oz (99.5 kg)   SpO2 98%   BMI 28.16 kg/m  Weight: 219 lb 4.8 oz (99.5 kg)  Physical Exam Constitutional:      General: He is not in acute distress.    Appearance: He is  well-developed.  Cardiovascular:     Rate and Rhythm: Normal rate and regular rhythm.     Heart sounds: Normal heart sounds. No murmur. No friction rub.  Pulmonary:     Effort: Pulmonary effort is normal. No respiratory distress.     Breath sounds: Normal breath sounds. No wheezing or rales.  Musculoskeletal:     Right lower leg: No edema.     Left lower leg: No edema.  Skin:    Comments: Superior to left elbow - 1cm papule with central dark keratin.   Neurological:  Mental Status: He is alert and oriented to person, place, and time.  Psychiatric:        Behavior: Behavior normal.      EKG Interpretation: stable EKG from 07/2019  Lab Review:yes    Assessment:   Mic[place] was seen today for medical clearance.  Diagnoses and all orders for this visit:  Essential hypertension: he does monitor at home. Encouraged him to continue to monitor. We discussed post op working on core strength; losing abdominal weight. Few pounds weight loss may help some. Didn't tolerate beta blockers well in past. Maxed out on current bp medications. Since pressure today is ok, encouraged to monitor but discuss with cardiology (follow up) in April for planned visit.  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  68 y.o.patient  approved for Surgery     Plan:   1. Preoperative workup as follows:none 2. Change in medication regimen before surgery: hold Eliquis for 2 days prior to surgery 3. No contraindications to planned surgery  Note electronically signed by provider.  Micheline Rough, MD

## 2019-11-21 ENCOUNTER — Other Ambulatory Visit: Payer: Self-pay

## 2019-11-21 ENCOUNTER — Ambulatory Visit (INDEPENDENT_AMBULATORY_CARE_PROVIDER_SITE_OTHER): Payer: Medicare HMO | Admitting: Family Medicine

## 2019-11-21 ENCOUNTER — Encounter: Payer: Self-pay | Admitting: Family Medicine

## 2019-11-21 VITALS — BP 118/70 | HR 79 | Temp 97.9°F | Ht 74.0 in | Wt 219.3 lb

## 2019-11-21 DIAGNOSIS — C44629 Squamous cell carcinoma of skin of left upper limb, including shoulder: Secondary | ICD-10-CM | POA: Diagnosis not present

## 2019-11-21 DIAGNOSIS — L858 Other specified epidermal thickening: Secondary | ICD-10-CM | POA: Diagnosis not present

## 2019-11-21 NOTE — Progress Notes (Signed)
Matthew Trujillo DOB: Jan 07, 1951 Encounter date: 11/21/2019  This is a 68 y.o. male who presents with Chief Complaint  Patient presents with  . Follow-up    patient presents for  excision of skin lesion left elbow    History of present illness: Patient returns today for removal of skin lesion on his left upper arm.  Lesion came up approximately 3 weeks ago.  It does seem to be enlarging in size.  It is tender when he bumps it.  Allergies  Allergen Reactions  . Celebrex [Celecoxib]     Blood in stool   Current Meds  Medication Sig  . albuterol (PROVENTIL HFA;VENTOLIN HFA) 108 (90 Base) MCG/ACT inhaler INHALE 2 PUFFS EVERY 4 HOURS AS NEEDED WHEEZING (Patient taking differently: Inhale 2 puffs into the lungs every 4 (four) hours as needed for wheezing. )  . albuterol (PROVENTIL) (2.5 MG/3ML) 0.083% nebulizer solution Take 3 mLs (2.5 mg total) by nebulization every 6 (six) hours as needed for wheezing or shortness of breath.  Marland Kitchen amLODipine (NORVASC) 10 MG tablet TAKE 10 mg TABLET BY MOUTH EVERY DAY  . apixaban (ELIQUIS) 5 MG TABS tablet Take 1 tablet (5 mg total) by mouth 2 (two) times daily.  . Calcium Carbonate-Vitamin D (CALTRATE 600+D) 600-400 MG-UNIT per tablet Take 1 tablet by mouth 2 (two) times daily.   . chlorthalidone (HYGROTON) 50 MG tablet Take 1 tablet (50 mg total) by mouth daily.  . Cholecalciferol (VITAMIN D-3) 5000 units TABS Take 5,000 Units by mouth daily.  . fluticasone (FLONASE) 50 MCG/ACT nasal spray PLACE 1 SPRAY INTO BOTH NOSTRILS DAILY.  . fluticasone (FLOVENT HFA) 110 MCG/ACT inhaler Inhale 2 puffs into the lungs 2 (two) times daily.  . methimazole (TAPAZOLE) 5 MG tablet Take 1 tablet (5 mg total) by mouth daily.  . Multiple Vitamin (MULTIVITAMIN) tablet Take 1 tablet by mouth daily.    . Omega-3 Fatty Acids (FISH OIL) 1000 MG CAPS Take by mouth 2 (two) times daily.   . ramipril (ALTACE) 10 MG capsule Take 1 capsule (10 mg total) by mouth daily.  Marland Kitchen  Spacer/Aero-Holding Chambers (E-Z SPACER) inhaler Use as instructed    Review of Systems  Constitutional: Negative for chills, fatigue and fever.  Respiratory: Negative for cough, chest tightness, shortness of breath and wheezing.   Cardiovascular: Negative for chest pain, palpitations and leg swelling.  Skin:       See HPI.    Objective:  BP 118/70 (BP Location: Left Arm, Patient Position: Sitting, Cuff Size: Large)   Pulse 79   Temp 97.9 F (36.6 C) (Temporal)   Ht 6\' 2"  (1.88 m)   Wt 219 lb 4.8 oz (99.5 kg)   SpO2 98%   BMI 28.16 kg/m   Weight: 219 lb 4.8 oz (99.5 kg)   BP Readings from Last 3 Encounters:  11/21/19 118/70  11/19/19 130/78  09/06/19 118/64   Wt Readings from Last 3 Encounters:  11/21/19 219 lb 4.8 oz (99.5 kg)  11/19/19 219 lb 4.8 oz (99.5 kg)  09/06/19 217 lb (98.4 kg)    Physical Exam Constitutional:      Appearance: Normal appearance.  Pulmonary:     Effort: Pulmonary effort is normal.  Skin:    Comments: Left upper arm to superior job of approximately 1 cm lightly erythematous papule with rounded edges and central keratin.  Neurological:     Mental Status: He is alert.     Excisional Biopsy Procedure Note  Pre-operative Diagnosis: Keratoacanthoma  Post-operative Diagnosis: Pathology pending  Locations: left upper extremity  1 cm X 1 cm  Anesthesia: Lidocaine 1% with epinephrine   Procedure Details  Patient informed of the risks, including bleeding and infection, and benefits of the  procedure and Verbal informed consent obtained. The lesion and surrounding area was given a sterile prep using chloraprep and draped in the usual sterile fashion.  Area injected with lidocaine plus epinephrine.  Elliptical incision made and the lesion removed in full.  Biopsy site closed with 5 4-0 sutures.  Sterile dressing applied.  The patient tolerated the procedure well.  Hemostasis was achieved.  Gauze pressure dressing applied secondary to patient  being on Eliquis after wound covered with bacitracin ointment.  Coban wrapped over gauze.  Complications: none.  Plan: 1. Instructed to keep the wound dry and covered for 24 hrs and clean thereafter with soap and water.  But no chlorine hot tubs or pool exposure to surgical site.  2. Warning signs of infection were reviewed.   3. Recommended that the patient use OTC analgesics as needed for pain.  4. Will contact patient with pathology results when obtained.  Micheline Rough, MD   Return for 7-10 days for suture removal.      Micheline Rough, MD

## 2019-11-27 ENCOUNTER — Other Ambulatory Visit: Payer: Self-pay | Admitting: Family Medicine

## 2019-11-27 DIAGNOSIS — C44629 Squamous cell carcinoma of skin of left upper limb, including shoulder: Secondary | ICD-10-CM

## 2019-11-29 ENCOUNTER — Other Ambulatory Visit: Payer: Self-pay

## 2019-11-29 DIAGNOSIS — L819 Disorder of pigmentation, unspecified: Secondary | ICD-10-CM | POA: Diagnosis not present

## 2019-11-29 DIAGNOSIS — L570 Actinic keratosis: Secondary | ICD-10-CM | POA: Diagnosis not present

## 2019-11-29 DIAGNOSIS — L812 Freckles: Secondary | ICD-10-CM | POA: Diagnosis not present

## 2019-11-29 DIAGNOSIS — L814 Other melanin hyperpigmentation: Secondary | ICD-10-CM | POA: Diagnosis not present

## 2019-11-29 DIAGNOSIS — D229 Melanocytic nevi, unspecified: Secondary | ICD-10-CM | POA: Diagnosis not present

## 2019-11-29 DIAGNOSIS — D1801 Hemangioma of skin and subcutaneous tissue: Secondary | ICD-10-CM | POA: Diagnosis not present

## 2019-11-29 DIAGNOSIS — C44519 Basal cell carcinoma of skin of other part of trunk: Secondary | ICD-10-CM | POA: Diagnosis not present

## 2019-11-29 DIAGNOSIS — L821 Other seborrheic keratosis: Secondary | ICD-10-CM | POA: Diagnosis not present

## 2019-11-29 DIAGNOSIS — I8311 Varicose veins of right lower extremity with inflammation: Secondary | ICD-10-CM | POA: Diagnosis not present

## 2019-11-29 DIAGNOSIS — D485 Neoplasm of uncertain behavior of skin: Secondary | ICD-10-CM | POA: Diagnosis not present

## 2019-11-30 ENCOUNTER — Ambulatory Visit (INDEPENDENT_AMBULATORY_CARE_PROVIDER_SITE_OTHER): Payer: Medicare HMO | Admitting: Family Medicine

## 2019-11-30 ENCOUNTER — Encounter: Payer: Self-pay | Admitting: Family Medicine

## 2019-11-30 VITALS — BP 128/68 | HR 104 | Temp 97.4°F | Wt 221.0 lb

## 2019-11-30 DIAGNOSIS — C44519 Basal cell carcinoma of skin of other part of trunk: Secondary | ICD-10-CM | POA: Diagnosis not present

## 2019-11-30 DIAGNOSIS — Z4802 Encounter for removal of sutures: Secondary | ICD-10-CM

## 2019-11-30 NOTE — Progress Notes (Signed)
Roquan Hartong Wandersee DOB: [birth year] Encounter date: 11/30/2019  Pt presents for suture removal. Sutures were placed 7 days ago. No concerns with healing.   Physical Exam  BP 128/68 (BP Location: Left Arm)   Pulse (!) 104   Temp (!) 97.4 F (36.3 C) (Temporal)   Wt 221 lb (100.2 kg)   SpO2 98%   BMI 28.37 kg/m    Constitutional: He appears well-developed and well-nourished. No distress.  Biopsy results discussed with pt: yes; squamous cell carcinoma. Referred to dermatology and he was able to see Dr. Pearline Cables yesterday.  He had an additional lesion removed from his back.  Sutures removed without difficulty.  On the medial aspect of the wound, there is some epiboly of wound edge.  After patient's permission, use silver nitrate to these wound edges and then reapproximated with Steri-Strips.  Gave patient residual Steri-Strips in case these come off.  Return if any concern for wound healing.  Explained how to place these to keep proper tension, but can leave these on until they fall off and if they stay on for at least 4 days, should not need to reapply.  Assessment: suture removal  Plan Keep wound moist with antibiotic ointment for healing.   Micheline Rough, MD

## 2019-12-06 DIAGNOSIS — H31091 Other chorioretinal scars, right eye: Secondary | ICD-10-CM | POA: Diagnosis not present

## 2019-12-06 DIAGNOSIS — H43393 Other vitreous opacities, bilateral: Secondary | ICD-10-CM | POA: Diagnosis not present

## 2019-12-06 DIAGNOSIS — H35372 Puckering of macula, left eye: Secondary | ICD-10-CM | POA: Diagnosis not present

## 2019-12-06 DIAGNOSIS — H43813 Vitreous degeneration, bilateral: Secondary | ICD-10-CM | POA: Diagnosis not present

## 2019-12-12 ENCOUNTER — Ambulatory Visit (HOSPITAL_COMMUNITY): Payer: Self-pay | Admitting: General Surgery

## 2019-12-27 ENCOUNTER — Encounter (HOSPITAL_BASED_OUTPATIENT_CLINIC_OR_DEPARTMENT_OTHER): Payer: Self-pay | Admitting: General Surgery

## 2019-12-27 ENCOUNTER — Other Ambulatory Visit: Payer: Self-pay

## 2020-01-01 ENCOUNTER — Other Ambulatory Visit (HOSPITAL_COMMUNITY)
Admission: RE | Admit: 2020-01-01 | Discharge: 2020-01-01 | Disposition: A | Discharge status: Home / Self Care | Payer: Medicare HMO | Source: Ambulatory Visit | Attending: General Surgery | Admitting: General Surgery

## 2020-01-01 ENCOUNTER — Encounter (HOSPITAL_BASED_OUTPATIENT_CLINIC_OR_DEPARTMENT_OTHER)
Admission: RE | Admit: 2020-01-01 | Discharge: 2020-01-01 | Disposition: A | Discharge status: Home / Self Care | Payer: Medicare HMO | Source: Ambulatory Visit | Attending: General Surgery | Admitting: General Surgery

## 2020-01-01 ENCOUNTER — Other Ambulatory Visit: Payer: Self-pay

## 2020-01-01 DIAGNOSIS — J4520 Mild intermittent asthma, uncomplicated: Secondary | ICD-10-CM | POA: Diagnosis not present

## 2020-01-01 DIAGNOSIS — E0590 Thyrotoxicosis, unspecified without thyrotoxic crisis or storm: Secondary | ICD-10-CM | POA: Diagnosis not present

## 2020-01-01 DIAGNOSIS — Z20822 Contact with and (suspected) exposure to covid-19: Secondary | ICD-10-CM | POA: Insufficient documentation

## 2020-01-01 DIAGNOSIS — Z7901 Long term (current) use of anticoagulants: Secondary | ICD-10-CM | POA: Diagnosis not present

## 2020-01-01 DIAGNOSIS — Z888 Allergy status to other drugs, medicaments and biological substances status: Secondary | ICD-10-CM | POA: Diagnosis not present

## 2020-01-01 DIAGNOSIS — Z01812 Encounter for preprocedural laboratory examination: Secondary | ICD-10-CM | POA: Diagnosis not present

## 2020-01-01 DIAGNOSIS — D6851 Activated protein C resistance: Secondary | ICD-10-CM | POA: Diagnosis not present

## 2020-01-01 DIAGNOSIS — Z7951 Long term (current) use of inhaled steroids: Secondary | ICD-10-CM | POA: Diagnosis not present

## 2020-01-01 DIAGNOSIS — Z79899 Other long term (current) drug therapy: Secondary | ICD-10-CM | POA: Diagnosis not present

## 2020-01-01 DIAGNOSIS — Z86711 Personal history of pulmonary embolism: Secondary | ICD-10-CM | POA: Diagnosis not present

## 2020-01-01 DIAGNOSIS — K429 Umbilical hernia without obstruction or gangrene: Secondary | ICD-10-CM | POA: Diagnosis not present

## 2020-01-01 DIAGNOSIS — I10 Essential (primary) hypertension: Secondary | ICD-10-CM | POA: Diagnosis not present

## 2020-01-01 LAB — BASIC METABOLIC PANEL
Anion gap: 12 (ref 5–15)
BUN: 20 mg/dL (ref 8–23)
CO2: 28 mmol/L (ref 22–32)
Calcium: 9.6 mg/dL (ref 8.9–10.3)
Chloride: 97 mmol/L — ABNORMAL LOW (ref 98–111)
Creatinine, Ser: 0.97 mg/dL (ref 0.61–1.24)
GFR calc Af Amer: >60 mL/min (ref >60)
GFR calc non Af Amer: >60 mL/min (ref >60)
Glucose, Bld: 103 mg/dL — ABNORMAL HIGH (ref 70–99)
Potassium: 3.5 mmol/L (ref 3.5–5.1)
Sodium: 137 mmol/L (ref 135–145)

## 2020-01-01 NOTE — Progress Notes (Signed)

## 2020-01-02 LAB — NOVEL CORONAVIRUS, NAA (HOSP ORDER, SEND-OUT TO REF LAB; TAT 18-24 HRS): SARS-CoV-2, NAA: NOT DETECTED

## 2020-01-04 ENCOUNTER — Ambulatory Visit (HOSPITAL_BASED_OUTPATIENT_CLINIC_OR_DEPARTMENT_OTHER): Payer: Medicare HMO | Admitting: Anesthesiology

## 2020-01-04 ENCOUNTER — Other Ambulatory Visit: Payer: Self-pay

## 2020-01-04 ENCOUNTER — Encounter (HOSPITAL_BASED_OUTPATIENT_CLINIC_OR_DEPARTMENT_OTHER): Payer: Self-pay | Admitting: General Surgery

## 2020-01-04 ENCOUNTER — Ambulatory Visit (HOSPITAL_BASED_OUTPATIENT_CLINIC_OR_DEPARTMENT_OTHER)
Admission: RE | Admit: 2020-01-04 | Discharge: 2020-01-04 | Disposition: A | Discharge status: Home / Self Care | Payer: Medicare HMO | Attending: General Surgery | Admitting: General Surgery

## 2020-01-04 ENCOUNTER — Encounter (HOSPITAL_BASED_OUTPATIENT_CLINIC_OR_DEPARTMENT_OTHER)
Admission: RE | Disposition: A | Discharge status: Home / Self Care | Payer: Self-pay | Source: Home / Self Care | Attending: General Surgery

## 2020-01-04 DIAGNOSIS — Z888 Allergy status to other drugs, medicaments and biological substances status: Secondary | ICD-10-CM | POA: Insufficient documentation

## 2020-01-04 DIAGNOSIS — J4520 Mild intermittent asthma, uncomplicated: Secondary | ICD-10-CM | POA: Diagnosis not present

## 2020-01-04 DIAGNOSIS — I10 Essential (primary) hypertension: Secondary | ICD-10-CM | POA: Insufficient documentation

## 2020-01-04 DIAGNOSIS — E0590 Thyrotoxicosis, unspecified without thyrotoxic crisis or storm: Secondary | ICD-10-CM | POA: Insufficient documentation

## 2020-01-04 DIAGNOSIS — K429 Umbilical hernia without obstruction or gangrene: Secondary | ICD-10-CM | POA: Diagnosis not present

## 2020-01-04 DIAGNOSIS — D6851 Activated protein C resistance: Secondary | ICD-10-CM | POA: Insufficient documentation

## 2020-01-04 DIAGNOSIS — Z86711 Personal history of pulmonary embolism: Secondary | ICD-10-CM | POA: Insufficient documentation

## 2020-01-04 DIAGNOSIS — Z7901 Long term (current) use of anticoagulants: Secondary | ICD-10-CM | POA: Diagnosis not present

## 2020-01-04 DIAGNOSIS — Z79899 Other long term (current) drug therapy: Secondary | ICD-10-CM | POA: Insufficient documentation

## 2020-01-04 DIAGNOSIS — J309 Allergic rhinitis, unspecified: Secondary | ICD-10-CM | POA: Diagnosis not present

## 2020-01-04 DIAGNOSIS — Z7951 Long term (current) use of inhaled steroids: Secondary | ICD-10-CM | POA: Diagnosis not present

## 2020-01-04 DIAGNOSIS — E785 Hyperlipidemia, unspecified: Secondary | ICD-10-CM | POA: Diagnosis not present

## 2020-01-04 HISTORY — DX: Thyrotoxicosis, unspecified without thyrotoxic crisis or storm: E05.90

## 2020-01-04 HISTORY — PX: UMBILICAL HERNIA REPAIR: SHX196

## 2020-01-04 SURGERY — REPAIR, HERNIA, UMBILICAL, ADULT
Anesthesia: General | Site: Abdomen

## 2020-01-04 MED ORDER — ROCURONIUM BROMIDE 100 MG/10ML IV SOLN
INTRAVENOUS | Status: DC | PRN
  Administered 2020-01-04: 60 mg via INTRAVENOUS

## 2020-01-04 MED ORDER — OXYCODONE HCL 5 MG PO TABS
ORAL_TABLET | ORAL | Status: AC
  Filled 2020-01-04: qty 1

## 2020-01-04 MED ORDER — PROMETHAZINE HCL 25 MG/ML IJ SOLN: 6.2500 mg | INTRAMUSCULAR | Status: DC | PRN

## 2020-01-04 MED ORDER — FENTANYL CITRATE (PF) 100 MCG/2ML IJ SOLN: 50.0000 ug | INTRAMUSCULAR | Status: DC | PRN

## 2020-01-04 MED ORDER — FENTANYL CITRATE (PF) 100 MCG/2ML IJ SOLN
INTRAMUSCULAR | Status: AC
  Filled 2020-01-04: qty 2

## 2020-01-04 MED ORDER — ACETAMINOPHEN 500 MG PO TABS
1000.0000 mg | ORAL_TABLET | Freq: Once | ORAL | Status: AC
  Administered 2020-01-04: 08:00:00 1000 mg via ORAL

## 2020-01-04 MED ORDER — MIDAZOLAM HCL 2 MG/2ML IJ SOLN
INTRAMUSCULAR | Status: AC
  Filled 2020-01-04: qty 2

## 2020-01-04 MED ORDER — DEXAMETHASONE SODIUM PHOSPHATE 4 MG/ML IJ SOLN
INTRAMUSCULAR | Status: DC | PRN
  Administered 2020-01-04: 10 mg via INTRAVENOUS

## 2020-01-04 MED ORDER — SUGAMMADEX SODIUM 200 MG/2ML IV SOLN
INTRAVENOUS | Status: DC | PRN
  Administered 2020-01-04: 200 mg via INTRAVENOUS

## 2020-01-04 MED ORDER — PROPOFOL 10 MG/ML IV BOLUS
INTRAVENOUS | Status: DC | PRN
  Administered 2020-01-04: 150 mg via INTRAVENOUS
  Administered 2020-01-04: 100 mg via INTRAVENOUS

## 2020-01-04 MED ORDER — FENTANYL CITRATE (PF) 100 MCG/2ML IJ SOLN
25.0000 ug | INTRAMUSCULAR | Status: DC | PRN
  Administered 2020-01-04 (×3): 50 ug via INTRAVENOUS

## 2020-01-04 MED ORDER — 0.9 % SODIUM CHLORIDE (POUR BTL) OPTIME
TOPICAL | Status: DC | PRN
  Administered 2020-01-04: 1000 mL

## 2020-01-04 MED ORDER — CHLORHEXIDINE GLUCONATE CLOTH 2 % EX PADS: 6.0000 | MEDICATED_PAD | Freq: Once | CUTANEOUS | Status: DC

## 2020-01-04 MED ORDER — PROPOFOL 10 MG/ML IV BOLUS
INTRAVENOUS | Status: AC
  Filled 2020-01-04: qty 20

## 2020-01-04 MED ORDER — BUPIVACAINE HCL (PF) 0.25 % IJ SOLN
INTRAMUSCULAR | Status: AC
  Filled 2020-01-04: qty 30

## 2020-01-04 MED ORDER — HYDROCODONE-ACETAMINOPHEN 5-325 MG PO TABS: 1.0000 | ORAL_TABLET | Freq: Four times a day (QID) | ORAL | 0 refills | Status: DC | PRN

## 2020-01-04 MED ORDER — GABAPENTIN 300 MG PO CAPS
300.0000 mg | ORAL_CAPSULE | ORAL | Status: AC
  Administered 2020-01-04: 08:00:00 300 mg via ORAL

## 2020-01-04 MED ORDER — FENTANYL CITRATE (PF) 100 MCG/2ML IJ SOLN
INTRAMUSCULAR | Status: DC | PRN
  Administered 2020-01-04: 100 ug via INTRAVENOUS

## 2020-01-04 MED ORDER — ACETAMINOPHEN 500 MG PO TABS: 1000.0000 mg | ORAL_TABLET | ORAL | Status: DC

## 2020-01-04 MED ORDER — CEFAZOLIN SODIUM-DEXTROSE 2-4 GM/100ML-% IV SOLN
INTRAVENOUS | Status: AC
  Filled 2020-01-04: qty 100

## 2020-01-04 MED ORDER — BUPIVACAINE HCL (PF) 0.25 % IJ SOLN
INTRAMUSCULAR | Status: DC | PRN
  Administered 2020-01-04: 30 mL

## 2020-01-04 MED ORDER — LACTATED RINGERS IV SOLN: INTRAVENOUS | Status: DC

## 2020-01-04 MED ORDER — CEFAZOLIN SODIUM-DEXTROSE 2-4 GM/100ML-% IV SOLN
2.0000 g | INTRAVENOUS | Status: AC
  Administered 2020-01-04: 2 g via INTRAVENOUS

## 2020-01-04 MED ORDER — GABAPENTIN 300 MG PO CAPS
ORAL_CAPSULE | ORAL | Status: AC
  Filled 2020-01-04: qty 1

## 2020-01-04 MED ORDER — MIDAZOLAM HCL 5 MG/5ML IJ SOLN
INTRAMUSCULAR | Status: DC | PRN
  Administered 2020-01-04: 2 mg via INTRAVENOUS

## 2020-01-04 MED ORDER — OXYCODONE HCL 5 MG PO TABS
5.0000 mg | ORAL_TABLET | Freq: Once | ORAL | Status: AC | PRN
  Administered 2020-01-04: 5 mg via ORAL

## 2020-01-04 MED ORDER — OXYCODONE HCL 5 MG/5ML PO SOLN: 5.0000 mg | Freq: Once | ORAL | Status: AC | PRN

## 2020-01-04 MED ORDER — ONDANSETRON HCL 4 MG/2ML IJ SOLN
INTRAMUSCULAR | Status: DC | PRN
  Administered 2020-01-04: 4 mg via INTRAVENOUS

## 2020-01-04 MED ORDER — LIDOCAINE HCL (CARDIAC) PF 100 MG/5ML IV SOSY
PREFILLED_SYRINGE | INTRAVENOUS | Status: DC | PRN
  Administered 2020-01-04: 50 mg via INTRAVENOUS

## 2020-01-04 MED ORDER — MIDAZOLAM HCL 2 MG/2ML IJ SOLN: 1.0000 mg | INTRAMUSCULAR | Status: DC | PRN

## 2020-01-04 MED ORDER — ACETAMINOPHEN 500 MG PO TABS
ORAL_TABLET | ORAL | Status: AC
  Filled 2020-01-04: qty 2

## 2020-01-04 SURGICAL SUPPLY — 46 items
ADH SKN CLS APL DERMABOND .7 (GAUZE/BANDAGES/DRESSINGS) ×1
APL PRP STRL LF DISP 70% ISPRP (MISCELLANEOUS) ×1
APL SKNCLS STERI-STRIP NONHPOA (GAUZE/BANDAGES/DRESSINGS)
BENZOIN TINCTURE PRP APPL 2/3 (GAUZE/BANDAGES/DRESSINGS) IMPLANT
BLADE CLIPPER SURG (BLADE) IMPLANT
BLADE SURG 15 STRL LF DISP TIS (BLADE) ×1 IMPLANT
BLADE SURG 15 STRL SS (BLADE) ×2
CHLORAPREP W/TINT 26 (MISCELLANEOUS) ×2 IMPLANT
COVER BACK TABLE 60X90IN (DRAPES) ×2 IMPLANT
COVER MAYO STAND STRL (DRAPES) ×2 IMPLANT
COVER WAND RF STERILE (DRAPES) IMPLANT
DECANTER SPIKE VIAL GLASS SM (MISCELLANEOUS) ×2 IMPLANT
DERMABOND ADVANCED (GAUZE/BANDAGES/DRESSINGS) ×1
DERMABOND ADVANCED .7 DNX12 (GAUZE/BANDAGES/DRESSINGS) ×1 IMPLANT
DRAPE LAPAROTOMY TRNSV 102X78 (DRAPES) ×2 IMPLANT
DRAPE UTILITY XL STRL (DRAPES) ×2 IMPLANT
DRSG TEGADERM 4X4.75 (GAUZE/BANDAGES/DRESSINGS) IMPLANT
ELECT COATED BLADE 2.86 ST (ELECTRODE) ×2 IMPLANT
ELECT REM PT RETURN 9FT ADLT (ELECTROSURGICAL) ×2
ELECTRODE REM PT RTRN 9FT ADLT (ELECTROSURGICAL) ×1 IMPLANT
GAUZE SPONGE 4X4 12PLY STRL LF (GAUZE/BANDAGES/DRESSINGS) IMPLANT
GLOVE BIO SURGEON STRL SZ7.5 (GLOVE) ×2 IMPLANT
GOWN STRL REUS W/ TWL LRG LVL3 (GOWN DISPOSABLE) ×2 IMPLANT
GOWN STRL REUS W/TWL LRG LVL3 (GOWN DISPOSABLE) ×4
MESH VENTRALEX ST 8CM LRG (Mesh General) ×1 IMPLANT
NDL HYPO 25X1 1.5 SAFETY (NEEDLE) ×1 IMPLANT
NEEDLE HYPO 22GX1.5 SAFETY (NEEDLE) IMPLANT
NEEDLE HYPO 25X1 1.5 SAFETY (NEEDLE) ×2 IMPLANT
NS IRRIG 1000ML POUR BTL (IV SOLUTION) IMPLANT
PACK BASIN DAY SURGERY FS (CUSTOM PROCEDURE TRAY) ×2 IMPLANT
PENCIL SMOKE EVACUATOR (MISCELLANEOUS) ×2 IMPLANT
SLEEVE SCD COMPRESS KNEE MED (MISCELLANEOUS) IMPLANT
SPONGE LAP 18X18 RF (DISPOSABLE) ×2 IMPLANT
STRIP CLOSURE SKIN 1/2X4 (GAUZE/BANDAGES/DRESSINGS) IMPLANT
SUT MON AB 4-0 PC3 18 (SUTURE) ×2 IMPLANT
SUT NOVA NAB DX-16 0-1 5-0 T12 (SUTURE) ×1 IMPLANT
SUT NOVA NAB GS-21 1 T12 (SUTURE) ×2 IMPLANT
SUT SILK 3 0 SH 30 (SUTURE) IMPLANT
SUT VIC AB 2-0 SH 27 (SUTURE) ×2
SUT VIC AB 2-0 SH 27XBRD (SUTURE) ×1 IMPLANT
SUT VIC AB 3-0 54X BRD REEL (SUTURE) IMPLANT
SUT VIC AB 3-0 BRD 54 (SUTURE)
SUT VIC AB 3-0 SH 27 (SUTURE)
SUT VIC AB 3-0 SH 27X BRD (SUTURE) IMPLANT
SYR CONTROL 10ML LL (SYRINGE) ×2 IMPLANT
TOWEL GREEN STERILE FF (TOWEL DISPOSABLE) ×4 IMPLANT

## 2020-01-04 NOTE — H&P (Signed)
Matthew Trujillo  Location: [facility] Patient #: [id number] DOB: October 11, 1951 Married / Language: English / Race: White Male   History of Present Illness  The patient is a 69 year old male who presents with abdominal pain. We are asked to see the patient in consultation by Dr. Ethlyn Gallery to evaluate him for an umbilical hernia. The patient is a 69 year old white male who has noticed a bulge at his umbilicus for more than 2 years now. Over the last several months it has gotten significantly larger and become more tender for him. He denies any nausea or vomiting. His appetite is good and his bowels are working normally. He does have factor V Leiden deficiency and is on Eliquis for life. He is otherwise in pretty good health. He does not smoke. He does have some asthma and hypertension.   Past Surgical History  Colon Polyp Removal - Colonoscopy  Foot Surgery  Right. Oral Surgery   Diagnostic Studies History Colonoscopy  within last year  Allergies  No Known Drug Allergies [11/01/2019]: Allergies Reconciled   Medication History  methIMAzole (5MG  Tablet, Oral) Active. Chlorthalidone (50MG  Tablet, Oral) Active. amLODIPine Besylate (10MG  Tablet, Oral) Active. Flovent (44MCG/ACT Aerosol, Inhalation) Active. Albuterol (90MCG/ACT Aerosol Soln, Inhalation) Active. Medications Reconciled  Social History  Alcohol use  Moderate alcohol use. Caffeine use  Carbonated beverages, Coffee, Tea. No drug use  Tobacco use  Never smoker.  Family History  Arthritis  Mother. Cancer  Father. Depression  Brother, Mother, Sister. Heart disease in male family member before age 16  Hypertension  Father, Mother. Malignant Neoplasm Of Pancreas  Brother. Melanoma  Father. Thyroid problems  Sister.  Other Problems  Asthma  High blood pressure  Other disease, cancer, significant illness     Review of Systems  General Not Present- Appetite Loss,  Chills, Fatigue, Fever, Night Sweats, Weight Gain and Weight Loss. Skin Present- Change in Wart/Mole. Not Present- Dryness, Hives, Jaundice, New Lesions, Non-Healing Wounds, Rash and Ulcer. HEENT Present- Wears glasses/contact lenses. Not Present- Earache, Hearing Loss, Hoarseness, Nose Bleed, Oral Ulcers, Ringing in the Ears, Seasonal Allergies, Sinus Pain, Sore Throat, Visual Disturbances and Yellow Eyes. Respiratory Present- Snoring. Not Present- Bloody sputum, Chronic Cough, Difficulty Breathing and Wheezing. Cardiovascular Not Present- Chest Pain, Difficulty Breathing Lying Down, Leg Cramps, Palpitations, Rapid Heart Rate, Shortness of Breath and Swelling of Extremities. Gastrointestinal Not Present- Abdominal Pain, Bloating, Bloody Stool, Change in Bowel Habits, Chronic diarrhea, Constipation, Difficulty Swallowing, Excessive gas, Gets full quickly at meals, Hemorrhoids, Indigestion, Nausea, Rectal Pain and Vomiting. Male Genitourinary Not Present- Blood in Urine, Change in Urinary Stream, Frequency, Impotence, Nocturia, Painful Urination, Urgency and Urine Leakage. Musculoskeletal Not Present- Back Pain, Joint Pain, Joint Stiffness, Muscle Pain, Muscle Weakness and Swelling of Extremities. Neurological Not Present- Decreased Memory, Fainting, Headaches, Numbness, Seizures, Tingling, Tremor, Trouble walking and Weakness. Psychiatric Not Present- Anxiety, Bipolar, Change in Sleep Pattern, Depression, Fearful and Frequent crying. Endocrine Not Present- Cold Intolerance, Excessive Hunger, Hair Changes, Heat Intolerance, Hot flashes and New Diabetes. Hematology Present- Blood Thinners. Not Present- Easy Bruising, Excessive bleeding, Gland problems, HIV and Persistent Infections.  Vitals  Weight: 216.8 lb Height: 74in Body Surface Area: 2.25 m Body Mass Index: 27.84 kg/m  Temp.: 97.46F  Pulse: 80 (Regular)  BP: 126/68 (Sitting, Left Arm, Standard)       Physical Exam   General Mental Status-Alert. General Appearance-Consistent with stated age. Hydration-Well hydrated. Voice-Normal.  Head and Neck Head-normocephalic, atraumatic with no lesions or palpable masses. Trachea-midline.  Thyroid Gland Characteristics - normal size and consistency.  Eye Eyeball - Bilateral-Extraocular movements intact. Sclera/Conjunctiva - Bilateral-No scleral icterus.  Chest and Lung Exam Chest and lung exam reveals -quiet, even and easy respiratory effort with no use of accessory muscles and on auscultation, normal breath sounds, no adventitious sounds and normal vocal resonance. Inspection Chest Wall - Normal. Back - normal.  Cardiovascular Cardiovascular examination reveals -normal heart sounds, regular rate and rhythm with no murmurs and normal pedal pulses bilaterally.  Abdomen Note: The abdomen is soft and nontender. There is a moderate-sized umbilical hernia that only partially reduces. There is some slight ischemic change to the umbilical skin. There is no sign of bowel obstruction.   Neurologic Neurologic evaluation reveals -alert and oriented x 3 with no impairment of recent or remote memory. Mental Status-Normal.  Musculoskeletal Normal Exam - Left-Upper Extremity Strength Normal and Lower Extremity Strength Normal. Normal Exam - Right-Upper Extremity Strength Normal and Lower Extremity Strength Normal.  Lymphatic Head & Neck  General Head & Neck Lymphatics: Bilateral - Description - Normal. Axillary  General Axillary Region: Bilateral - Description - Normal. Tenderness - Non Tender. Femoral & Inguinal  Generalized Femoral & Inguinal Lymphatics: Bilateral - Description - Normal. Tenderness - Non Tender.    Assessment & Plan  UMBILICAL HERNIA WITHOUT OBSTRUCTION OR GANGRENE (K42.9) Impression: The patient appears to have a moderate-sized umbilical hernia that likely has some incarcerated omental fat within it but no  sign of obstruction. Because of the risk of incarceration and strangulation and he think he would benefit from having the hernia fixed. He would also like to have this done. I have discussed with him in detail the risks and benefits of the operation affix the hernia as well as some of the technical aspects including the use of mesh and the possible loss of the umbilicus and he understands and wishes to proceed Current Plans Pt Education - Hernia: discussed with patient and provided information.

## 2020-01-04 NOTE — Transfer of Care (Signed)
Immediate Anesthesia Transfer of Care Note  Patient: Matthew Trujillo  Procedure(s) Performed: UMBILICAL HERNIA REPAIR WITH MESH (N/A Abdomen)  Patient Location: PACU  Anesthesia Type:General  Level of Consciousness: sedated and patient cooperative  Airway & Oxygen Therapy: Patient Spontanous Breathing and Patient connected to face mask oxygen  Post-op Assessment: Report given to RN and Post -op Vital signs reviewed and stable  Post vital signs: Reviewed and stable  Last Vitals:  Vitals Value Taken Time  BP 132/73 01/04/20 1118  Temp    Pulse 60 01/04/20 1119  Resp 12 01/04/20 1119  SpO2 100 % 01/04/20 1119  Vitals shown include unvalidated device data.  Last Pain:  Vitals:   01/04/20 0817  TempSrc: Temporal      Patients Stated Pain Goal: 3 ([id number])  Complications: No apparent anesthesia complications

## 2020-01-04 NOTE — Anesthesia Postprocedure Evaluation (Signed)
Anesthesia Post Note  Patient: Matthew Trujillo  Procedure(s) Performed: UMBILICAL HERNIA REPAIR WITH MESH (N/A Abdomen)     Patient location during evaluation: PACU Anesthesia Type: General Level of consciousness: awake and alert and oriented Pain management: pain level controlled Vital Signs Assessment: post-procedure vital signs reviewed and stable Respiratory status: spontaneous breathing, nonlabored ventilation and respiratory function stable Cardiovascular status: blood pressure returned to baseline Postop Assessment: no apparent nausea or vomiting Anesthetic complications: no    Last Vitals:  Vitals:   01/04/20 1200 01/04/20 1215  BP: 139/77 138/78  Pulse: 66 65  Resp: 12 (!) 9  Temp:    SpO2: 99% 98%    Last Pain:  Vitals:   01/04/20 1200  TempSrc:   PainSc: [place]

## 2020-01-04 NOTE — Op Note (Signed)
01/04/2020  11:05 AM  PATIENT:  Matthew Trujillo  69 y.o. male  PRE-OPERATIVE DIAGNOSIS:  UMBILICAL HERNIA  POST-OPERATIVE DIAGNOSIS:  UMBILICAL HERNIA  PROCEDURE:  Procedure(s): UMBILICAL HERNIA REPAIR WITH MESH (N/A)  SURGEON:  Surgeon(s) and Role:    * Jovita Kussmaul, MD - Primary  PHYSICIAN ASSISTANT:   ASSISTANTS: none   ANESTHESIA:   local and general  EBL:  minimal   BLOOD ADMINISTERED:none  DRAINS: none   LOCAL MEDICATIONS USED:  MARCAINE     SPECIMEN:  No Specimen  DISPOSITION OF SPECIMEN:  N/A  COUNTS:  YES  TOURNIQUET:  * No tourniquets in log *  DICTATION: .Dragon Dictation   After informed consent was obtained the patient was brought to the operating room and placed in the supine position on the operating room table.  After adequate induction of general anesthesia the patient's abdomen was prepped with ChloraPrep, allowed to dry, and draped in usual sterile manner.  An appropriate timeout was performed.  The area around the umbilicus was infiltrated with quarter percent Marcaine.  A curvilinear incision was made with a 15 blade knife along the inferior edge of the umbilicus.  The incision was carried through the skin and subcutaneous tissue sharply with the electrocautery until the hernia sac was encountered.  The hernia sac appeared to have only omentum within it.  The hernia sac was opened.  The omentum was reduced.  A small portion of the omentum had to be removed by clamping with hemostats, dividing, and ligating it with 3-0 Vicryl ties in order to get the rest of the omentum to reduce.  At this point the fascial edges were identified and appeared healthy.  The hernia sac was excised sharply with the electrocautery.  A large piece of ventral light umbilical hernia repair mesh was chosen.  The mesh was anchored to the fascia around the defect about 2 cm out from the defect in all 4 quadrants with a #1 Novafil U stitch placed under direct vision.  The stitches  anchored the mesh nicely up against the inside of the anterior abdominal wall.  Next the fascial defect was closed with interrupted #1 Novafil stitches incorporating the anchors of the mesh.  Once this was accomplished the hernia seem well repaired and the mesh seem to be in good position.  The wound was irrigated with copious amounts of saline.  The base of the umbilicus was tacked to the fascia with an interrupted 2-0 Vicryl stitch.  The subcutaneous tissue was then closed with interrupted 2-0 Vicryl stitches.  The skin was then closed with a running 4-0 Monocryl subcuticular stitch.  Dermabond dressings and a compression dressing were applied.  The patient tolerated the procedure well.  At the end of the case all needle sponge and instrument counts were correct.  The patient was then awakened and taken to recovery in stable condition.  PLAN OF CARE: Discharge to home after PACU  PATIENT DISPOSITION:  PACU - hemodynamically stable.   Delay start of Pharmacological VTE agent (>24hrs) due to surgical blood loss or risk of bleeding: not applicable

## 2020-01-04 NOTE — Interval H&P Note (Signed)
History and Physical Interval Note:  01/04/2020 9:08 AM  Matthew Trujillo  has presented today for surgery, with the diagnosis of UMBILICAL HERNIA.  The various methods of treatment have been discussed with the patient and family. After consideration of risks, benefits and other options for treatment, the patient has consented to  Procedure(s): UMBILICAL HERNIA REPAIR WITH MESH (N/A) as a surgical intervention.  The patient's history has been reviewed, patient examined, no change in status, stable for surgery.  I have reviewed the patient's chart and labs.  Questions were answered to the patient's satisfaction.     Autumn Messing III

## 2020-01-04 NOTE — Anesthesia Procedure Notes (Signed)
Procedure Name: Intubation Date/Time: 01/04/2020 10:05 AM Performed by: Marrianne Mood, CRNA Pre-anesthesia Checklist: Patient identified, Emergency Drugs available, Suction available, Patient being monitored and Timeout performed Patient Re-evaluated:Patient Re-evaluated prior to induction Oxygen Delivery Method: Circle system utilized Preoxygenation: Pre-oxygenation with 100% oxygen Induction Type: IV induction Ventilation: Mask ventilation without difficulty Laryngoscope Size: Miller and 3 Grade View: Grade III Tube type: Oral Tube size: 8.0 mm Number of attempts: 1 Airway Equipment and Method: Stylet and Oral airway Placement Confirmation: ETT inserted through vocal cords under direct vision,  positive ETCO2 and breath sounds checked- equal and bilateral Secured at: 23 cm Tube secured with: Tape Dental Injury: Teeth and Oropharynx as per pre-operative assessment

## 2020-01-04 NOTE — Discharge Instructions (Signed)
No Tylenol until 2:30pm  You were given oxycodone 5mg  at 1232pm today.  Next dose of medicine will be due at 6:30 pm tonight.    Post Anesthesia Home Care Instructions  Activity: Get plenty of rest for the remainder of the day. A responsible individual must stay with you for 24 hours following the procedure.  For the next 24 hours, DO NOT: -Drive a car -Paediatric nurse -Drink alcoholic beverages -Take any medication unless instructed by your physician -Make any legal decisions or sign important papers.  Meals: Start with liquid foods such as gelatin or soup. Progress to regular foods as tolerated. Avoid greasy, spicy, heavy foods. If nausea and/or vomiting occur, drink only clear liquids until the nausea and/or vomiting subsides. Call your physician if vomiting continues.  Special Instructions/Symptoms: Your throat may feel dry or sore from the anesthesia or the breathing tube placed in your throat during surgery. If this causes discomfort, gargle with warm salt water. The discomfort should disappear within 24 hours.  If you had a scopolamine patch placed behind your ear for the management of post- operative nausea and/or vomiting:  1. The medication in the patch is effective for 72 hours, after which it should be removed.  Wrap patch in a tissue and discard in the trash. Wash hands thoroughly with soap and water. 2. You may remove the patch earlier than 72 hours if you experience unpleasant side effects which may include dry mouth, dizziness or visual disturbances. 3. Avoid touching the patch. Wash your hands with soap and water after contact with the patch.

## 2020-01-04 NOTE — Anesthesia Preprocedure Evaluation (Addendum)
Anesthesia Evaluation  Patient identified by MRN, date of birth, ID band Patient awake    Reviewed: Allergy & Precautions, NPO status , Patient's Chart, lab work & pertinent test results  History of Anesthesia Complications Negative for: history of anesthetic complications  Airway Mallampati: II  TM Distance: >3 FB Neck ROM: Full    Dental no notable dental hx.    Pulmonary asthma , PE (2011, on Eliquis)   Pulmonary exam normal        Cardiovascular hypertension, Pt. on medications Normal cardiovascular exam     Neuro/Psych negative neurological ROS  negative psych ROS   GI/Hepatic negative GI ROS, Neg liver ROS,   Endo/Other  Hyperthyroidism   Renal/GU negative Renal ROS  negative genitourinary   Musculoskeletal negative musculoskeletal ROS (+)   Abdominal   Peds  Hematology negative hematology ROS (+)   Anesthesia Other Findings Day of surgery medications reviewed with patient.  Reproductive/Obstetrics negative OB ROS                            Anesthesia Physical Anesthesia Plan  ASA: II  Anesthesia Plan: General   Post-op Pain Management:    Induction: Intravenous  PONV Risk Score and Plan: 3 and Treatment may vary due to age or medical condition, Ondansetron, Dexamethasone and Midazolam  Airway Management Planned: Oral ETT  Additional Equipment: None  Intra-op Plan:   Post-operative Plan: Extubation in OR  Informed Consent: I have reviewed the patients History and Physical, chart, labs and discussed the procedure including the risks, benefits and alternatives for the proposed anesthesia with the patient or authorized representative who has indicated his/her understanding and acceptance.     Dental advisory given  Plan Discussed with: CRNA  Anesthesia Plan Comments:        Anesthesia Quick Evaluation

## 2020-01-07 ENCOUNTER — Encounter: Payer: Self-pay | Admitting: *Deleted

## 2020-01-10 DIAGNOSIS — D229 Melanocytic nevi, unspecified: Secondary | ICD-10-CM | POA: Diagnosis not present

## 2020-01-10 DIAGNOSIS — L814 Other melanin hyperpigmentation: Secondary | ICD-10-CM | POA: Diagnosis not present

## 2020-01-10 DIAGNOSIS — L821 Other seborrheic keratosis: Secondary | ICD-10-CM | POA: Diagnosis not present

## 2020-01-10 DIAGNOSIS — L819 Disorder of pigmentation, unspecified: Secondary | ICD-10-CM | POA: Diagnosis not present

## 2020-01-10 DIAGNOSIS — L905 Scar conditions and fibrosis of skin: Secondary | ICD-10-CM | POA: Diagnosis not present

## 2020-01-10 DIAGNOSIS — Z85828 Personal history of other malignant neoplasm of skin: Secondary | ICD-10-CM | POA: Diagnosis not present

## 2020-01-24 ENCOUNTER — Encounter: Payer: Self-pay | Admitting: Family Medicine

## 2020-01-28 ENCOUNTER — Other Ambulatory Visit: Payer: Self-pay | Admitting: Family Medicine

## 2020-01-28 DIAGNOSIS — D6851 Activated protein C resistance: Secondary | ICD-10-CM

## 2020-01-28 MED ORDER — APIXABAN 5 MG PO TABS: 5.0000 mg | ORAL_TABLET | Freq: Two times a day (BID) | ORAL | 0 refills | Status: DC

## 2020-02-22 ENCOUNTER — Other Ambulatory Visit: Payer: Medicare HMO

## 2020-02-29 ENCOUNTER — Ambulatory Visit: Payer: Medicare HMO | Admitting: Family Medicine

## 2020-03-27 ENCOUNTER — Other Ambulatory Visit: Payer: Self-pay | Admitting: Family Medicine

## 2020-04-15 ENCOUNTER — Other Ambulatory Visit: Payer: Self-pay | Admitting: Endocrinology

## 2020-04-15 DIAGNOSIS — E0500 Thyrotoxicosis with diffuse goiter without thyrotoxic crisis or storm: Secondary | ICD-10-CM

## 2020-04-22 ENCOUNTER — Encounter: Payer: Self-pay | Admitting: Family Medicine

## 2020-04-22 DIAGNOSIS — I10 Essential (primary) hypertension: Secondary | ICD-10-CM

## 2020-04-22 DIAGNOSIS — E0590 Thyrotoxicosis, unspecified without thyrotoxic crisis or storm: Secondary | ICD-10-CM

## 2020-04-22 DIAGNOSIS — E785 Hyperlipidemia, unspecified: Secondary | ICD-10-CM

## 2020-04-26 ENCOUNTER — Other Ambulatory Visit: Payer: Self-pay | Admitting: Family Medicine

## 2020-04-26 ENCOUNTER — Other Ambulatory Visit: Payer: Self-pay | Admitting: Cardiology

## 2020-04-26 DIAGNOSIS — J4521 Mild intermittent asthma with (acute) exacerbation: Secondary | ICD-10-CM

## 2020-04-28 ENCOUNTER — Other Ambulatory Visit: Payer: Self-pay

## 2020-04-29 ENCOUNTER — Other Ambulatory Visit (INDEPENDENT_AMBULATORY_CARE_PROVIDER_SITE_OTHER): Payer: Medicare HMO

## 2020-04-29 DIAGNOSIS — I10 Essential (primary) hypertension: Secondary | ICD-10-CM

## 2020-04-29 DIAGNOSIS — E785 Hyperlipidemia, unspecified: Secondary | ICD-10-CM | POA: Diagnosis not present

## 2020-04-29 DIAGNOSIS — D72829 Elevated white blood cell count, unspecified: Secondary | ICD-10-CM

## 2020-04-29 DIAGNOSIS — E0590 Thyrotoxicosis, unspecified without thyrotoxic crisis or storm: Secondary | ICD-10-CM | POA: Diagnosis not present

## 2020-04-29 DIAGNOSIS — E876 Hypokalemia: Secondary | ICD-10-CM

## 2020-04-29 LAB — LIPID PANEL
Cholesterol: 201 mg/dL — ABNORMAL HIGH (ref 0–200)
HDL: 43.3 mg/dL (ref >39.00)
LDL Cholesterol: 132 mg/dL — ABNORMAL HIGH (ref 0–99)
NonHDL: 157.99
Total CHOL/HDL Ratio: 5
Triglycerides: 132 mg/dL (ref 0.0–149.0)
VLDL: 26.4 mg/dL (ref 0.0–40.0)

## 2020-04-29 LAB — CBC WITH DIFFERENTIAL/PLATELET
Basophils Absolute: 0.1 10*3/uL (ref 0.0–0.1)
Basophils Relative: 0.9 % (ref 0.0–3.0)
Eosinophils Absolute: 0.2 10*3/uL (ref 0.0–0.7)
Eosinophils Relative: 2.2 % (ref 0.0–5.0)
HCT: 45.9 % (ref 39.0–52.0)
Hemoglobin: 15.6 g/dL (ref 13.0–17.0)
Lymphocytes Relative: 45.6 % (ref 12.0–46.0)
Lymphs Abs: 4.3 10*3/uL — ABNORMAL HIGH (ref 0.7–4.0)
MCHC: 34.1 g/dL (ref 30.0–36.0)
MCV: 91.5 fl (ref 78.0–100.0)
Monocytes Absolute: 0.7 10*3/uL (ref 0.1–1.0)
Monocytes Relative: 7.3 % (ref 3.0–12.0)
Neutro Abs: 4.1 10*3/uL (ref 1.4–7.7)
Neutrophils Relative %: 44 % (ref 43.0–77.0)
Platelets: 328 10*3/uL (ref 150.0–400.0)
RBC: 5.02 Mil/uL (ref 4.22–5.81)
RDW: 13.9 % (ref 11.5–15.5)
WBC: 9.3 10*3/uL (ref 4.0–10.5)

## 2020-04-29 LAB — COMPREHENSIVE METABOLIC PANEL
ALT: 33 U/L (ref 0–53)
AST: 23 U/L (ref 0–37)
Albumin: 4.5 g/dL (ref 3.5–5.2)
Alkaline Phosphatase: 52 U/L (ref 39–117)
BUN: 20 mg/dL (ref 6–23)
CO2: 32 mEq/L (ref 19–32)
Calcium: 9.6 mg/dL (ref 8.4–10.5)
Chloride: 98 mEq/L (ref 96–112)
Creatinine, Ser: 0.92 mg/dL (ref 0.40–1.50)
GFR: 82.1 mL/min (ref >60.00)
Glucose, Bld: 93 mg/dL (ref 70–99)
Potassium: 3.9 mEq/L (ref 3.5–5.1)
Sodium: 136 mEq/L (ref 135–145)
Total Bilirubin: 0.9 mg/dL (ref 0.2–1.2)
Total Protein: 6.8 g/dL (ref 6.0–8.3)

## 2020-04-29 LAB — T4, FREE: Free T4: 0.83 ng/dL (ref 0.60–1.60)

## 2020-04-29 LAB — T3, FREE: T3, Free: 3.6 pg/mL (ref 2.3–4.2)

## 2020-04-29 LAB — TSH: TSH: 4.03 u[IU]/mL (ref 0.35–4.50)

## 2020-05-02 ENCOUNTER — Encounter: Payer: Self-pay | Admitting: Endocrinology

## 2020-05-02 ENCOUNTER — Other Ambulatory Visit: Payer: Self-pay

## 2020-05-02 ENCOUNTER — Ambulatory Visit: Payer: Medicare HMO | Admitting: Endocrinology

## 2020-05-02 VITALS — BP 124/82 | HR 82 | Ht 74.0 in | Wt 223.0 lb

## 2020-05-02 DIAGNOSIS — E0590 Thyrotoxicosis, unspecified without thyrotoxic crisis or storm: Secondary | ICD-10-CM

## 2020-05-02 NOTE — Progress Notes (Signed)
Subjective:    Patient ID: Matthew Trujillo, male    DOB: Oct 16, 1951, 69 y.o.   MRN: [id number]  HPI Pt returns for f/u of hyperthyroidism (dx'ed with hyperthyroidism in 2017; US showed autoimmune thyroidtis; he declines RAI).  He takes tapazole 5 mg qd, as rx'ed.  pt again reports slight tremor of the hands, but no assoc palpitations.   Past Medical History:  Diagnosis Date  . ALLERGIC RHINITIS 05/21/2010  . ASTHMA 05/21/2010  . Asthma   . Factor V Leiden ([place])   . Hx of adenomatous colonic polyps   . HYPERTENSION 05/21/2010   Diffiult to control  . Hyperthyroidism   . Inguinal hernia   . Ischemic colitis ([place])   . Lower GI bleed   . Prolonged QT interval   . PULMONARY EMBOLISM 05/21/2010  . Umbilical hernia     Past Surgical History:  Procedure Laterality Date  . ACHILLES TENDON REPAIR    . COLONOSCOPY     last 2019, multiple  . DOPPLER ECHOCARDIOGRAPHY  12/30/2011   EF >55%; MILD LVH,MILD LAE,NORMAL DIASTOLIC FUNCTION  . RENAL DOPPLER  12/30/2011   NORMAL RENAL  . UMBILICAL HERNIA REPAIR N/A 01/04/2020   Procedure: UMBILICAL HERNIA REPAIR WITH MESH;  Surgeon: Jovita Kussmaul, MD;  Location: [facility];  Service: General;  Laterality: N/A;    Social History   Socioeconomic History  . Marital status: Married    Spouse name: Not on file  . Number of children: Not on file  . Years of education: Not on file  . Highest education level: Not on file  Occupational History  . Not on file  Tobacco Use  . Smoking status: Never Smoker  . Smokeless tobacco: Never Used  Substance and Sexual Activity  . Alcohol use: Yes    Comment: occasional   . Drug use: No  . Sexual activity: Not on file  Other Topics Concern  . Not on file  Social History Narrative   He is a married father of 2. [employer] employee   He does exercise about 3-4 days a week with no true symptoms.    Does not smoke and occasional social alcohol.    Social Determinants of Health   Financial  Resource Strain:   . Difficulty of Paying Living Expenses:   Food Insecurity:   . Worried About Charity fundraiser in the Last Year:   . Arboriculturist in the Last Year:   Transportation Needs:   . Film/video editor (Medical):   Marland Kitchen Lack of Transportation (Non-Medical):   Physical Activity:   . Days of Exercise per Week:   . Minutes of Exercise per Session:   Stress:   . Feeling of Stress :   Social Connections:   . Frequency of Communication with Friends and Family:   . Frequency of Social Gatherings with Friends and Family:   . Attends Religious Services:   . Active Member of Clubs or Organizations:   . Attends Archivist Meetings:   Marland Kitchen Marital Status:   Intimate Partner Violence:   . Fear of Current or Ex-Partner:   . Emotionally Abused:   Marland Kitchen Physically Abused:   . Sexually Abused:     Current Outpatient Medications on File Prior to Visit  Medication Sig Dispense Refill  . albuterol (PROVENTIL HFA;VENTOLIN HFA) 108 (90 Base) MCG/ACT inhaler INHALE 2 PUFFS EVERY 4 HOURS AS NEEDED WHEEZING (Patient taking differently: Inhale 2 puffs into the lungs  every 4 (four) hours as needed for wheezing. ) 8.5 Inhaler 2  . albuterol (PROVENTIL) (2.5 MG/3ML) 0.083% nebulizer solution Take 3 mLs (2.5 mg total) by nebulization every 6 (six) hours as needed for wheezing or shortness of breath. 75 mL 12  . amLODipine (NORVASC) 10 MG tablet TAKE 10 mg TABLET BY MOUTH EVERY DAY 90 tablet 2  . Calcium Carbonate-Vitamin D (CALTRATE 600+D) 600-400 MG-UNIT per tablet Take 1 tablet by mouth 2 (two) times daily.     . chlorthalidone (HYGROTON) 50 MG tablet TAKE 1 TABLET BY MOUTH EVERY DAY 90 tablet 1  . Cholecalciferol (VITAMIN D-3) 5000 units TABS Take 5,000 Units by mouth daily.    Marland Kitchen FLOVENT HFA 110 MCG/ACT inhaler IMHALE 2 PUFFS BY MOUTH TWICE A DAY. RINSE MOUTH AFTER USE 36 Inhaler 1  . fluticasone (FLONASE) 50 MCG/ACT nasal spray PLACE 1 SPRAY INTO BOTH NOSTRILS DAILY. 48 mL 1  .  methimazole (TAPAZOLE) 5 MG tablet Take 1 tablet (5 mg total) by mouth daily. 90 tablet 1  . Multiple Vitamin (MULTIVITAMIN) tablet Take 1 tablet by mouth daily.      . Omega-3 Fatty Acids (FISH OIL) 1000 MG CAPS Take by mouth 2 (two) times daily.     . ramipril (ALTACE) 10 MG capsule TAKE 1 CAPSULE BY MOUTH EVERY DAY 90 capsule 3  . Spacer/Aero-Holding Chambers (E-Z SPACER) inhaler Use as instructed 1 each 2  . apixaban (ELIQUIS) 5 MG TABS tablet Take 1 tablet (5 mg total) by mouth 2 (two) times daily for 14 days. 28 tablet 0   No current facility-administered medications on file prior to visit.    Allergies  Allergen Reactions  . Celebrex [Celecoxib]     Blood in stool  . Neosporin [Bacitracin-Polymyxin B] Rash    Family History  Problem Relation Age of Onset  . Kidney failure Father        single kidney; hx of recurrent infections  . Renal cancer Father   . Pancreatic cancer Brother   . Hypertension Mother   . Hypothyroidism Mother   . Hypothyroidism Sister   . Heart disease Neg Hx   . Colon cancer Neg Hx   . Esophageal cancer Neg Hx   . Stomach cancer Neg Hx   . Rectal cancer Neg Hx     BP 124/82   Pulse 82   Ht 6\' 2"  (1.88 m)   Wt 223 lb (101.2 kg)   SpO2 95%   BMI 28.63 kg/m    Review of Systems Denies fever.      Objective:   Physical Exam VITAL SIGNS:  See vs page GENERAL: no distress NECK: There is no palpable thyroid enlargement.  No thyroid nodule is palpable.  No palpable lymphadenopathy at the anterior neck.     Lab Results  Component Value Date   TSH 4.03 04/29/2020       Assessment & Plan:  Hyperthyroidism: well-controlled.     Patient Instructions  Please continue the same methimazole. If ever you have fever while taking methimazole, stop it and call us, even if the reason is obvious, because of the risk of a rare side-effect.   It is best to never miss the medication.  However, if you do miss it, next best is to double up the next  time.   Please come back for a follow-up appointment in 6 months.

## 2020-05-02 NOTE — Patient Instructions (Signed)
Please continue the same methimazole.    ?If ever you have fever while taking methimazole, stop it and call us, even if the reason is obvious, because of the risk of a rare side-effect.   ?It is best to never miss the medication.  However, if you do miss it, next best is to double up the next time.   ?Please come back for a follow-up appointment in 6 months.   ?

## 2020-05-06 ENCOUNTER — Other Ambulatory Visit: Payer: Self-pay | Admitting: Endocrinology

## 2020-05-06 DIAGNOSIS — E0500 Thyrotoxicosis with diffuse goiter without thyrotoxic crisis or storm: Secondary | ICD-10-CM

## 2020-05-12 ENCOUNTER — Encounter: Payer: Self-pay | Admitting: Family Medicine

## 2020-05-12 ENCOUNTER — Other Ambulatory Visit: Payer: Self-pay | Admitting: Cardiology

## 2020-05-20 ENCOUNTER — Encounter: Payer: Self-pay | Admitting: Cardiology

## 2020-05-20 ENCOUNTER — Other Ambulatory Visit: Payer: Self-pay

## 2020-05-20 ENCOUNTER — Ambulatory Visit: Payer: Medicare HMO | Admitting: Cardiology

## 2020-05-20 VITALS — BP 140/76 | HR 62 | Temp 96.8°F | Ht 74.0 in | Wt 222.0 lb

## 2020-05-20 DIAGNOSIS — E0500 Thyrotoxicosis with diffuse goiter without thyrotoxic crisis or storm: Secondary | ICD-10-CM | POA: Diagnosis not present

## 2020-05-20 DIAGNOSIS — I10 Essential (primary) hypertension: Secondary | ICD-10-CM | POA: Diagnosis not present

## 2020-05-20 DIAGNOSIS — R600 Localized edema: Secondary | ICD-10-CM

## 2020-05-20 DIAGNOSIS — E785 Hyperlipidemia, unspecified: Secondary | ICD-10-CM | POA: Diagnosis not present

## 2020-05-20 DIAGNOSIS — I82402 Acute embolism and thrombosis of unspecified deep veins of left lower extremity: Secondary | ICD-10-CM

## 2020-05-20 DIAGNOSIS — R002 Palpitations: Secondary | ICD-10-CM

## 2020-05-20 NOTE — Progress Notes (Signed)
Primary Care Provider: Caren Macadam, MD Cardiologist: No primary care provider on file. Electrophysiologist: None  Clinic Note: Chief Complaint  Patient presents with  . Follow-up    12 months.   HPI:    Matthew Trujillo is a 69 y.o. male with a PMH below who presents today for annual follow-up for hypertension hyperlipidemia, venous stasis edema, Factor V Leiden (h/o PE in 2011, on Eliquis) and palpitations related to hyperthyroidism...  [name] was last seen on May 07, 2019 via telemedicine.  He indicated that time that his hyperthyroidism was better controlled (treated with methimazole).  As a result, we were able to back his dose of beta-blocker down to 25 mg twice daily.  Edema pretty well controlled with chlorthalidone.  Allergies.  Recent Hospitalizations:   August 08, 2019-admitted with colitis and lower GI bleed.  Thought to be may be ischemic left  January 04, 2020-umbilical hernia repair with mesh  Reviewed  CV studies:    The following studies were reviewed today: (if available, images/films reviewed: From Epic Chart or Care Everywhere) . None:  Interval History:   ORLIE Trujillo returns here today for annual follow-up doing very well.  No major issues.  No further blood in his stool.  No signs or symptoms of recurrent PE with no chest pain or pressure.  No dyspnea.  No notable palpitations-no longer on beta-blocker.  Remains on methimazole for hyperthyroidism.  CV Review of Symptoms (Summary) Cardiovascular ROS: no chest pain or dyspnea on exertion positive for - Stable mild edema negative for - irregular heartbeat, orthopnea, palpitations, paroxysmal nocturnal dyspnea, rapid heart rate, shortness of breath or Syncope/near syncope, TIA/amaurosis fugax  The patient does not have symptoms concerning for COVID-19 infection (fever, chills, cough, or new shortness of breath).  The patient is practicing social distancing & Masking.   He completed his  2 COVID-19 vaccine injections-second shot was February 21.  REVIEWED OF SYSTEMS   Review of Systems  Constitutional: Negative for malaise/fatigue and weight loss.  HENT: Negative for nosebleeds.   Respiratory: Positive for cough (Related allergies). Negative for shortness of breath and wheezing.   Gastrointestinal: Negative for blood in stool (None since August) and melena.  Genitourinary: Negative for hematuria.  Musculoskeletal: Positive for joint pain (Mild aches and pains).  Neurological: Negative for dizziness, seizures and weakness.  Endo/Heme/Allergies: Positive for environmental allergies.  Psychiatric/Behavioral: Negative for memory loss. The patient is not nervous/anxious and does not have insomnia.    I have reviewed and (if needed) personally updated the patient's problem list, medications, allergies, past medical and surgical history, social and family history.   PAST MEDICAL HISTORY   Past Medical History:  Diagnosis Date  . ALLERGIC RHINITIS 05/21/2010  . ASTHMA 05/21/2010  . Asthma   . Factor V Leiden ([place])   . Hx of adenomatous colonic polyps   . HYPERTENSION 05/21/2010   Diffiult to control  . Hyperthyroidism   . Inguinal hernia   . Ischemic colitis ([place])   . Lower GI bleed   . Prolonged QT interval   . PULMONARY EMBOLISM 05/21/2010  . Umbilical hernia     PAST SURGICAL HISTORY   Past Surgical History:  Procedure Laterality Date  . ACHILLES TENDON REPAIR    . COLONOSCOPY     last 2019, multiple  . DOPPLER ECHOCARDIOGRAPHY  12/30/2011   EF >55%; MILD LVH,MILD LAE,NORMAL DIASTOLIC FUNCTION  . RENAL DOPPLER  12/30/2011   NORMAL RENAL  . UMBILICAL HERNIA  REPAIR N/A 01/04/2020   Procedure: UMBILICAL HERNIA REPAIR WITH MESH;  Surgeon: Jovita Kussmaul, MD;  Location: [facility];  Service: General;  Laterality: N/A;    MEDICATIONS/ALLERGIES   Current Meds  Medication Sig  . albuterol (PROVENTIL HFA;VENTOLIN HFA) 108 (90 Base) MCG/ACT inhaler  INHALE 2 PUFFS EVERY 4 HOURS AS NEEDED WHEEZING (Patient taking differently: Inhale 2 puffs into the lungs every 4 (four) hours as needed for wheezing. )  . albuterol (PROVENTIL) (2.5 MG/3ML) 0.083% nebulizer solution Take 3 mLs (2.5 mg total) by nebulization every 6 (six) hours as needed for wheezing or shortness of breath.  Marland Kitchen amLODipine (NORVASC) 10 MG tablet TAKE 1 TABLET BY MOUTH EVERY DAY  . apixaban (ELIQUIS) 5 MG TABS tablet Take 1 tablet (5 mg total) by mouth 2 (two) times daily for 14 days.  . Calcium Carbonate-Vitamin D (CALTRATE 600+D) 600-400 MG-UNIT per tablet Take 1 tablet by mouth 2 (two) times daily.   . chlorthalidone (HYGROTON) 50 MG tablet TAKE 1 TABLET BY MOUTH EVERY DAY  . Cholecalciferol (VITAMIN D-3) 5000 units TABS Take 5,000 Units by mouth daily.  Marland Kitchen FLOVENT HFA 110 MCG/ACT inhaler IMHALE 2 PUFFS BY MOUTH TWICE A DAY. RINSE MOUTH AFTER USE  . fluticasone (FLONASE) 50 MCG/ACT nasal spray PLACE 1 SPRAY INTO BOTH NOSTRILS DAILY.  . methimazole (TAPAZOLE) 5 MG tablet TAKE 1 TABLET BY MOUTH EVERY DAY  . Multiple Vitamin (MULTIVITAMIN) tablet Take 1 tablet by mouth daily.    . Omega-3 Fatty Acids (FISH OIL) 1000 MG CAPS Take by mouth 2 (two) times daily.   . ramipril (ALTACE) 10 MG capsule TAKE 1 CAPSULE BY MOUTH EVERY DAY  . Spacer/Aero-Holding Chambers (E-Z SPACER) inhaler Use as instructed    Allergies  Allergen Reactions  . Celebrex [Celecoxib]     Blood in stool  . Neosporin [Bacitracin-Polymyxin B] Rash    SOCIAL HISTORY/FAMILY HISTORY   Reviewed in Epic:  Pertinent findings: Retired the end of 2019.  Had lost down to 216 pounds, but suffered a knee injury.  OBJCTIVE -PE, EKG, labs   Wt Readings from Last 3 Encounters:  05/21/20 221 lb 3.2 oz (100.3 kg)  05/20/20 222 lb (100.7 kg)  05/02/20 223 lb (101.2 kg)    Physical Exam: BP 140/76   Pulse 62   Temp (!) 96.8 F (36 C)   Ht 6\' 2"  (1.88 m)   Wt 222 lb (100.7 kg)   BMI 28.50 kg/m  Physical Exam   Constitutional: He is oriented to person, place, and time. He appears well-developed and well-nourished.  Well-groomed.  Healthy-appearing  HENT:  Head: Normocephalic and atraumatic.  Neck: No hepatojugular reflux and no JVD present. Carotid bruit is not present.  Cardiovascular: Normal rate, regular rhythm, normal heart sounds and intact distal pulses.  No extrasystoles are present. PMI is not displaced. Exam reveals no gallop and no friction rub.  No murmur heard. Pulmonary/Chest: Effort normal and breath sounds normal. No respiratory distress. He has no wheezes.  Musculoskeletal:        General: Normal range of motion.     Cervical back: Normal range of motion and neck supple.     Comments: Support stockings in place  Neurological: He is alert and oriented to person, place, and time.  Psychiatric: He has a normal mood and affect. His behavior is normal. Judgment and thought content normal.  Vitals reviewed.     Adult ECG Report  Rate: 62 ;  Rhythm: normal sinus  rhythm and Normal axis, intervals durations.;   Narrative Interpretation: Stable  Recent Labs:     Lab Results  Component Value Date   CHOL 201 (H) 04/29/2020   HDL 43.30 04/29/2020   LDLCALC 132 (H) 04/29/2020   TRIG 132.0 04/29/2020   CHOLHDL 5 04/29/2020   Lab Results  Component Value Date   CREATININE 0.92 04/29/2020   BUN 20 04/29/2020   NA 136 04/29/2020   K 3.9 04/29/2020   CL 98 04/29/2020   CO2 32 04/29/2020   Lab Results  Component Value Date   TSH 4.03 04/29/2020    ASSESSMENT/PLAN    Problem List Items Addressed This Visit    Palpitations - Primary (Chronic)   Relevant Orders   EKG 12-Lead (Completed)   Essential hypertension (Chronic)    Borderline elevated blood pressure today.  At PCPs office is 124/80.  He does always seem to get a little bit "excited when he comes to the cardiologist office".  No longer on beta-blocker.  Is on amlodipine and chlorthalidone along with ACE inhibitor.   Unfortunately, these 3 medications are much at max dose.  Follow closely as we would need to add another medication if pressures are not adequately controlled.  He is due to see his PCP soon to discuss lipids.  Can also follow-up blood pressure.Berenice Primas disease (Chronic)    Well-controlled on methimazole.  No longer on beta-blocker.      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  No further GI bleed.  Okay to hold Eliquis for procedures or surgeries.      Hyperlipidemia with target LDL less than 100 (Chronic)    Lipids not adequately controlled.  LDL is 132.  He is planning to discuss this with his PCP but rather low threshold to start on rosuvastatin (and use lower doses of 10 to 20 mg with lower side effect profile).  Defer to PCP      Bilateral edema of lower extremity       COVID-19 Education: The signs and symptoms of COVID-19 were discussed with the patient and how to seek care for testing (follow up with PCP or arrange E-visit).   The importance of social distancing and COVID-19 vaccination was discussed today.  I spent a total of 19minutes with the patient. >  50% of the time was spent in direct patient consultation.  Additional time spent with chart review  / charting (studies, outside notes, etc): 6 Total Time: 24 min   Current medicines are reviewed at length with the patient today.  (+/- concerns) none  Notice: This dictation was prepared with Dragon dictation along with smaller phrase technology. Any transcriptional errors that result from this process are unintentional and may not be corrected upon review.  Patient Instructions / Medication Changes & Studies & Tests Ordered   Patient Instructions  Medication Instructions:  No changes  *If you need a refill on your cardiac medications before your next appointment, please call your pharmacy*   Lab Work: Not needed     Testing/Procedures: Not needed   Follow-Up: At [facility], you and your health needs are our priority.  As part of our continuing mission to provide you with exceptional heart care, we have created designated Provider Care Teams.  These Care Teams include your primary Cardiologist (physician) and Advanced Practice Providers (APPs -  Physician Assistants and Nurse Practitioners) who  all work together to provide you with the care you need, when you need it.    Your next appointment:   12 month(s) May 2022  The format for your next appointment:   In Person  Provider:   Glenetta Hew, MD   Other Instructions  Talk with your primary to see if a statin is needed     Studies Ordered:   Orders Placed This Encounter  Procedures  . EKG 12-Lead     Glenetta Hew, M.D., M.S. Interventional Cardiologist   Pager # [phone] Phone # [phone] [address] [place], [place] [address]   Thank you for choosing Heartcare at [facility]!!

## 2020-05-20 NOTE — Patient Instructions (Addendum)
Medication Instructions:  No changes  *If you need a refill on your cardiac medications before your next appointment, please call your pharmacy*   Lab Work: Not needed    Testing/Procedures: Not needed   Follow-Up: At [facility], you and your health needs are our priority.  As part of our continuing mission to provide you with exceptional heart care, we have created designated Provider Care Teams.  These Care Teams include your primary Cardiologist (physician) and Advanced Practice Providers (APPs -  Physician Assistants and Nurse Practitioners) who all work together to provide you with the care you need, when you need it.    Your next appointment:   12 month(s) May 2022  The format for your next appointment:   In Person  Provider:   Glenetta Hew, MD   Other Instructions  Talk with your primary to see if a statin is needed

## 2020-05-21 ENCOUNTER — Ambulatory Visit (INDEPENDENT_AMBULATORY_CARE_PROVIDER_SITE_OTHER): Payer: Medicare HMO | Admitting: Family Medicine

## 2020-05-21 ENCOUNTER — Encounter: Payer: Self-pay | Admitting: Family Medicine

## 2020-05-21 VITALS — BP 118/76 | HR 76 | Temp 98.0°F | Ht 74.0 in | Wt 221.2 lb

## 2020-05-21 DIAGNOSIS — E0590 Thyrotoxicosis, unspecified without thyrotoxic crisis or storm: Secondary | ICD-10-CM | POA: Diagnosis not present

## 2020-05-21 DIAGNOSIS — E785 Hyperlipidemia, unspecified: Secondary | ICD-10-CM | POA: Diagnosis not present

## 2020-05-21 DIAGNOSIS — J4520 Mild intermittent asthma, uncomplicated: Secondary | ICD-10-CM

## 2020-05-21 DIAGNOSIS — I10 Essential (primary) hypertension: Secondary | ICD-10-CM | POA: Diagnosis not present

## 2020-05-21 DIAGNOSIS — J309 Allergic rhinitis, unspecified: Secondary | ICD-10-CM | POA: Diagnosis not present

## 2020-05-21 DIAGNOSIS — I82402 Acute embolism and thrombosis of unspecified deep veins of left lower extremity: Secondary | ICD-10-CM

## 2020-05-21 MED ORDER — ROSUVASTATIN CALCIUM 5 MG PO TABS: 5.0000 mg | ORAL_TABLET | Freq: Every day | ORAL | 1 refills | Status: DC

## 2020-05-21 NOTE — Progress Notes (Signed)
Matthew Trujillo DOB: 1951-08-01 Encounter date: 05/21/2020  This is a 69 y.o. male who presents with Chief Complaint  Patient presents with  . Follow-up    History of present illness: Last visit with me was in December/2020.  Has noted rash on calves with hiking - noted with sun screen, so switched to hiking in shade. Red splotches on lower legs. Just red, not itching unless something rubs against it. Only itches if touching it. If washes with cold compresses. Just in summer and just outside.   Hypertension: Amlodipine 10 mg, chlorthalidone 50 mg, ramipril 10 mg Hyperlipidemia: saw cardiology yesterday; recommended consideration for crestor due to elevated LDL.  Hyperthyroidism: Methimazole; managed by endocrinologist.  Recurrent DVTs: On chronic Eliquis. Allergies: Flonase - helpful during allergy season and usually stops in the summer. Takes based on pollen count.  Asthma: Flovent, albuterol. Breathing has been doing well.    Allergies  Allergen Reactions  . Celebrex [Celecoxib]     Blood in stool  . Neosporin [Bacitracin-Polymyxin B] Rash   Current Meds  Medication Sig  . albuterol (PROVENTIL HFA;VENTOLIN HFA) 108 (90 Base) MCG/ACT inhaler INHALE 2 PUFFS EVERY 4 HOURS AS NEEDED WHEEZING (Patient taking differently: Inhale 2 puffs into the lungs every 4 (four) hours as needed for wheezing. )  . albuterol (PROVENTIL) (2.5 MG/3ML) 0.083% nebulizer solution Take 3 mLs (2.5 mg total) by nebulization every 6 (six) hours as needed for wheezing or shortness of breath.  Marland Kitchen amLODipine (NORVASC) 10 MG tablet TAKE 1 TABLET BY MOUTH EVERY DAY  . Calcium Carbonate-Vitamin D (CALTRATE 600+D) 600-400 MG-UNIT per tablet Take 1 tablet by mouth 2 (two) times daily.   . chlorthalidone (HYGROTON) 50 MG tablet TAKE 1 TABLET BY MOUTH EVERY DAY  . Cholecalciferol (VITAMIN D-3) 5000 units TABS Take 5,000 Units by mouth daily.  Marland Kitchen FLOVENT HFA 110 MCG/ACT inhaler IMHALE 2 PUFFS BY MOUTH TWICE A DAY.  RINSE MOUTH AFTER USE  . fluticasone (FLONASE) 50 MCG/ACT nasal spray PLACE 1 SPRAY INTO BOTH NOSTRILS DAILY.  . methimazole (TAPAZOLE) 5 MG tablet TAKE 1 TABLET BY MOUTH EVERY DAY  . Multiple Vitamin (MULTIVITAMIN) tablet Take 1 tablet by mouth daily.    . Omega-3 Fatty Acids (FISH OIL) 1000 MG CAPS Take by mouth 2 (two) times daily.   . ramipril (ALTACE) 10 MG capsule TAKE 1 CAPSULE BY MOUTH EVERY DAY  . Spacer/Aero-Holding Chambers (E-Z SPACER) inhaler Use as instructed    Review of Systems  Constitutional: Negative for chills, fatigue and fever.  Respiratory: Negative for cough, chest tightness, shortness of breath and wheezing.   Cardiovascular: Negative for chest pain, palpitations and leg swelling.  Skin: Positive for rash.    Objective:  BP 118/76 (BP Location: Left Arm, Patient Position: Sitting, Cuff Size: Large)   Pulse 76   Temp 98 F (36.7 C) (Temporal)   Ht 6\' 2"  (1.88 m)   Wt 221 lb 3.2 oz (100.3 kg)   BMI 28.40 kg/m   Weight: 221 lb 3.2 oz (100.3 kg)   BP Readings from Last 3 Encounters:  05/21/20 118/76  05/20/20 140/76  05/02/20 124/82   Wt Readings from Last 3 Encounters:  05/21/20 221 lb 3.2 oz (100.3 kg)  05/20/20 222 lb (100.7 kg)  05/02/20 223 lb (101.2 kg)    Physical Exam Constitutional:      General: He is not in acute distress.    Appearance: He is well-developed.  Cardiovascular:     Rate and Rhythm: Normal  rate and regular rhythm.     Heart sounds: Normal heart sounds. No murmur. No friction rub.  Pulmonary:     Effort: Pulmonary effort is normal. No respiratory distress.     Breath sounds: Normal breath sounds. No wheezing or rales.  Musculoskeletal:     Right lower leg: No edema.     Left lower leg: No edema.  Neurological:     Mental Status: He is alert and oriented to person, place, and time.  Psychiatric:        Behavior: Behavior normal.     Assessment/Plan  1. Essential hypertension Well-controlled.  Continue current  medication.  2. 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  3. Allergic rhinitis, unspecified seasonality, unspecified trigger Well-controlled with Flonase.  4. Asthma in adult, mild intermittent, uncomplicated Stable.  Continue Flovent daily.  Albuterol just as needed.  5. Hyperthyroidism Has been stable.  He is following with endocrinology.  Currently on methimazole 5 mg.  6. Hyperlipidemia with target LDL less than 100 Starting Crestor.  Discussed cholesterol goals for him. - Lipid panel; Future - Comprehensive metabolic panel; Future  Return in about 3 months (around 08/21/2020) for bloodwork only in 3 months; physical in 6 months.    Micheline Rough, MD

## 2020-05-26 ENCOUNTER — Encounter: Payer: Self-pay | Admitting: Cardiology

## 2020-05-26 NOTE — Assessment & Plan Note (Signed)
Now on maintenance dose Eliquis.  No further GI bleed.  Okay to hold Eliquis for procedures or surgeries.

## 2020-05-26 NOTE — Assessment & Plan Note (Signed)
Lipids not adequately controlled.  LDL is 132.  He is planning to discuss this with his PCP but rather low threshold to start on rosuvastatin (and use lower doses of 10 to 20 mg with lower side effect profile).  Defer to PCP

## 2020-05-26 NOTE — Assessment & Plan Note (Signed)
Well-controlled on methimazole.  No longer on beta-blocker.

## 2020-05-26 NOTE — Assessment & Plan Note (Signed)
Borderline elevated blood pressure today.  At PCPs office is 124/80.  He does always seem to get a little bit "excited when he comes to the cardiologist office".  No longer on beta-blocker.  Is on amlodipine and chlorthalidone along with ACE inhibitor.  Unfortunately, these 3 medications are much at max dose.  Follow closely as we would need to add another medication if pressures are not adequately controlled.  He is due to see his PCP soon to discuss lipids.  Can also follow-up blood pressure.Marland Kitchen

## 2020-06-02 ENCOUNTER — Other Ambulatory Visit: Payer: Self-pay | Admitting: Family Medicine

## 2020-06-02 DIAGNOSIS — D6851 Activated protein C resistance: Secondary | ICD-10-CM

## 2020-06-02 MED ORDER — APIXABAN 5 MG PO TABS: 5.0000 mg | ORAL_TABLET | Freq: Two times a day (BID) | ORAL | 0 refills | Status: DC

## 2020-06-04 ENCOUNTER — Other Ambulatory Visit: Payer: Self-pay | Admitting: Family Medicine

## 2020-06-04 ENCOUNTER — Encounter: Payer: Self-pay | Admitting: Family Medicine

## 2020-06-04 DIAGNOSIS — D6851 Activated protein C resistance: Secondary | ICD-10-CM

## 2020-06-04 MED ORDER — APIXABAN 5 MG PO TABS: 5.0000 mg | ORAL_TABLET | Freq: Two times a day (BID) | ORAL | 3 refills | Status: DC

## 2020-06-18 ENCOUNTER — Encounter: Payer: Self-pay | Admitting: Family Medicine

## 2020-07-28 NOTE — Addendum Note (Signed)
Addended by: Marrion Coy on: 07/28/2020 03:46 PM   Modules accepted: Orders

## 2020-07-30 ENCOUNTER — Other Ambulatory Visit: Payer: Self-pay | Admitting: Family Medicine

## 2020-07-30 DIAGNOSIS — D229 Melanocytic nevi, unspecified: Secondary | ICD-10-CM | POA: Diagnosis not present

## 2020-07-30 DIAGNOSIS — L738 Other specified follicular disorders: Secondary | ICD-10-CM | POA: Diagnosis not present

## 2020-07-30 DIAGNOSIS — Z85828 Personal history of other malignant neoplasm of skin: Secondary | ICD-10-CM | POA: Diagnosis not present

## 2020-07-30 DIAGNOSIS — L905 Scar conditions and fibrosis of skin: Secondary | ICD-10-CM | POA: Diagnosis not present

## 2020-07-30 DIAGNOSIS — L821 Other seborrheic keratosis: Secondary | ICD-10-CM | POA: Diagnosis not present

## 2020-07-30 DIAGNOSIS — L819 Disorder of pigmentation, unspecified: Secondary | ICD-10-CM | POA: Diagnosis not present

## 2020-07-30 DIAGNOSIS — D1801 Hemangioma of skin and subcutaneous tissue: Secondary | ICD-10-CM | POA: Diagnosis not present

## 2020-07-30 DIAGNOSIS — L814 Other melanin hyperpigmentation: Secondary | ICD-10-CM | POA: Diagnosis not present

## 2020-08-21 ENCOUNTER — Other Ambulatory Visit: Payer: Self-pay

## 2020-08-21 ENCOUNTER — Other Ambulatory Visit (INDEPENDENT_AMBULATORY_CARE_PROVIDER_SITE_OTHER): Payer: Medicare HMO

## 2020-08-21 DIAGNOSIS — E785 Hyperlipidemia, unspecified: Secondary | ICD-10-CM

## 2020-08-21 LAB — COMPREHENSIVE METABOLIC PANEL
AG Ratio: 1.9 (calc) (ref 1.0–2.5)
ALT: 31 U/L (ref 9–46)
AST: 21 U/L (ref 10–35)
Albumin: 4.5 g/dL (ref 3.6–5.1)
Alkaline phosphatase (APISO): 43 U/L (ref 35–144)
BUN: 23 mg/dL (ref 7–25)
CO2: 27 mmol/L (ref 20–32)
Calcium: 9.5 mg/dL (ref 8.6–10.3)
Chloride: 101 mmol/L (ref 98–110)
Creat: 1.01 mg/dL (ref 0.70–1.25)
Globulin: 2.4 g/dL (calc) (ref 1.9–3.7)
Glucose, Bld: 97 mg/dL (ref 65–99)
Potassium: 4.1 mmol/L (ref 3.5–5.3)
Sodium: 138 mmol/L (ref 135–146)
Total Bilirubin: 1.1 mg/dL (ref 0.2–1.2)
Total Protein: 6.9 g/dL (ref 6.1–8.1)

## 2020-08-21 LAB — LIPID PANEL
Cholesterol: 130 mg/dL (ref <200)
HDL: 45 mg/dL (ref >=40)
LDL Cholesterol (Calc): 66 mg/dL (calc)
Non-HDL Cholesterol (Calc): 85 mg/dL (calc) (ref <130)
Total CHOL/HDL Ratio: 2.9 (calc) (ref <5.0)
Triglycerides: 109 mg/dL (ref <150)

## 2020-09-17 DIAGNOSIS — R69 Illness, unspecified: Secondary | ICD-10-CM | POA: Diagnosis not present

## 2020-10-06 IMAGING — CT CT ABDOMEN AND PELVIS WITH CONTRAST
2 of 5 series · 16 of 46 positions shown, 18 images · IV contrast (OMNIPAQUE)
Comparison: None.

CLINICAL DATA: Severe abdominal cramping.

EXAM:
CT ABDOMEN AND PELVIS WITH CONTRAST
TECHNIQUE: Multidetector CT imaging of the abdomen and pelvis was performed
using the standard protocol following bolus administration of
intravenous contrast.
CONTRAST:  100mL OMNIPAQUE IOHEXOL 300 MG/ML  SOLN

[Series 2: axial st · axial · 0.83mm/px · z∈[+1222,+1627]mm · 13 of 93 slices shown, 15 images]
[im 6/93  soft-tissue]
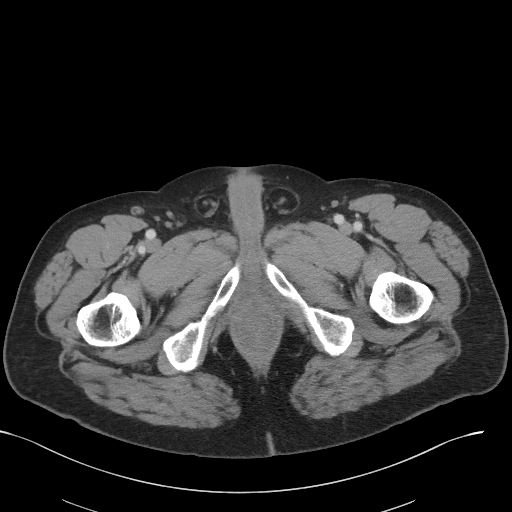
[im 6/93  bone]
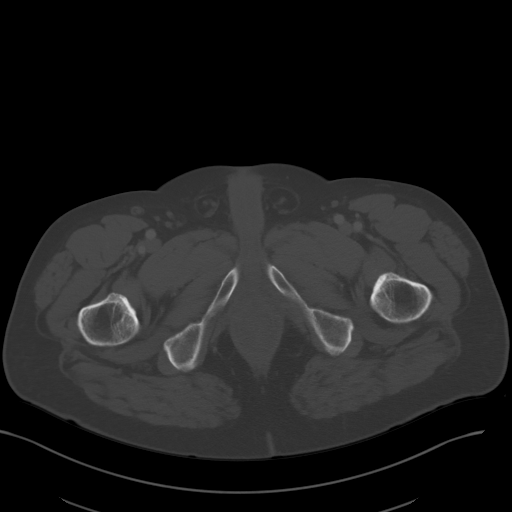
[im 11/93  soft-tissue]
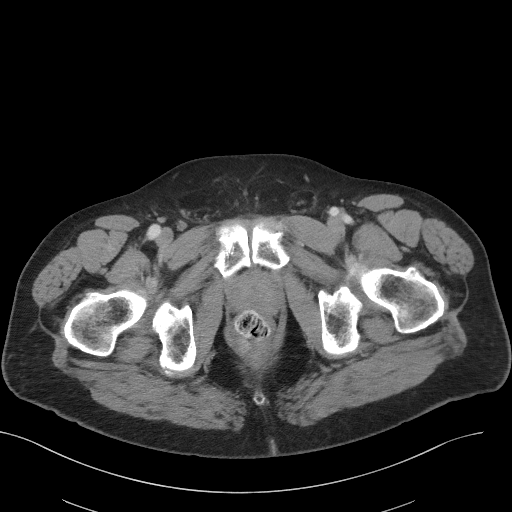
[im 22/93  soft-tissue]
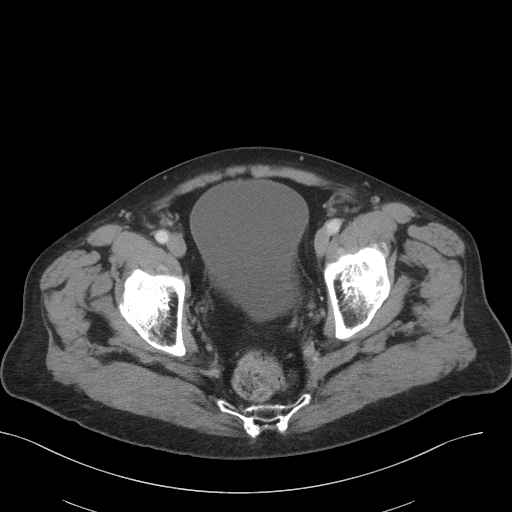
[im 28/93  soft-tissue]
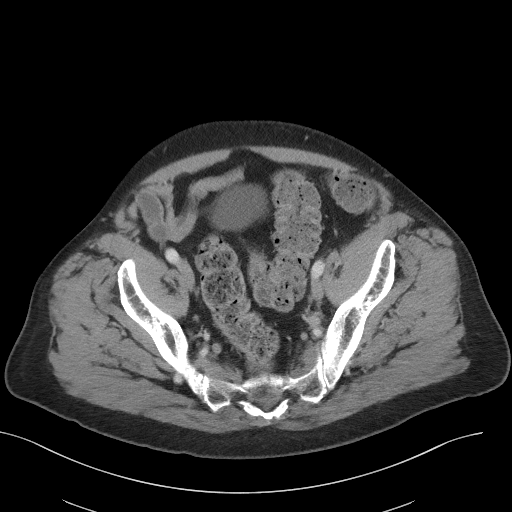
[im 33/93  soft-tissue]
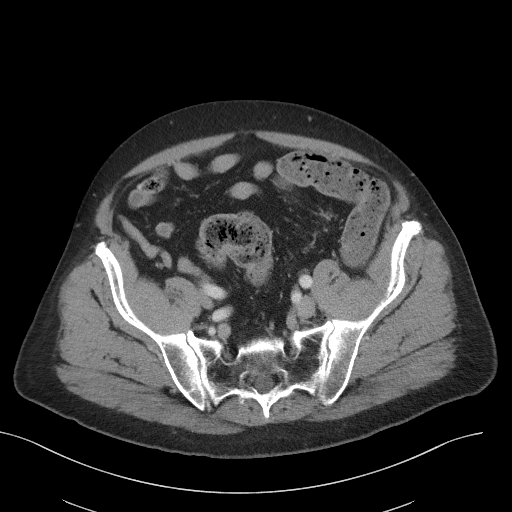
[im 38/93  soft-tissue]
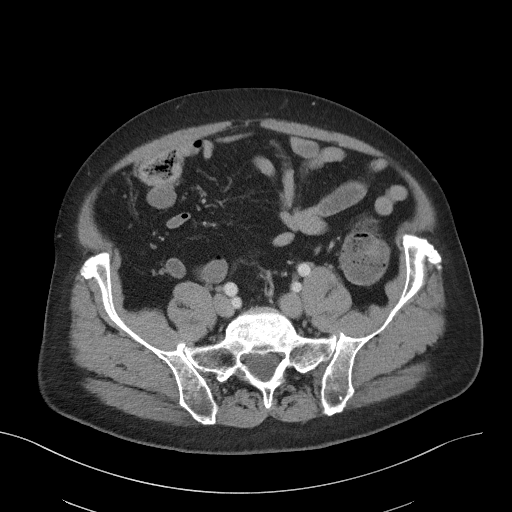
[im 49/93  soft-tissue]
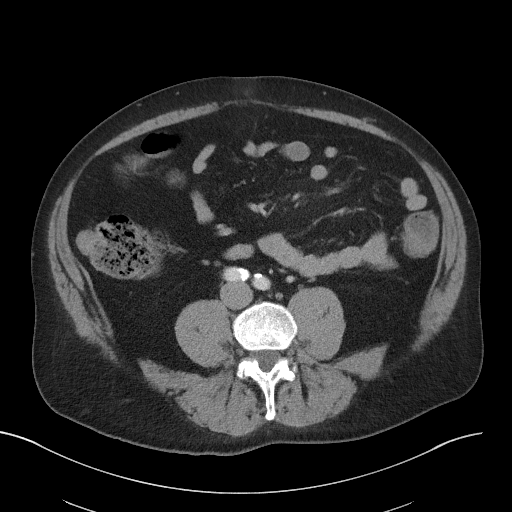
[im 55/93  soft-tissue]
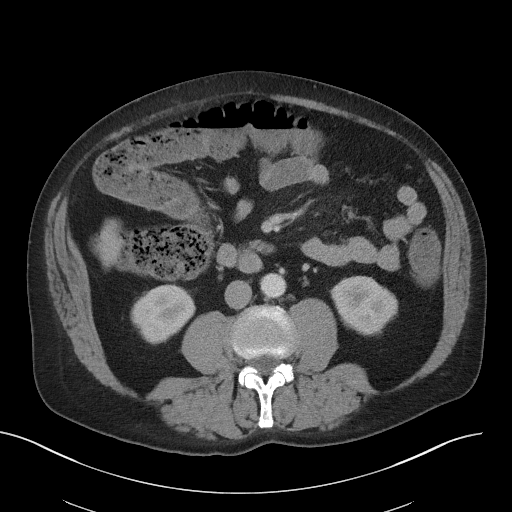
[im 60/93  soft-tissue]
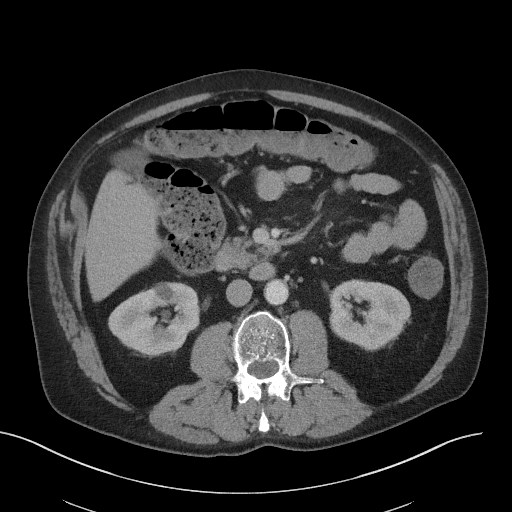
[im 60/93  bone]
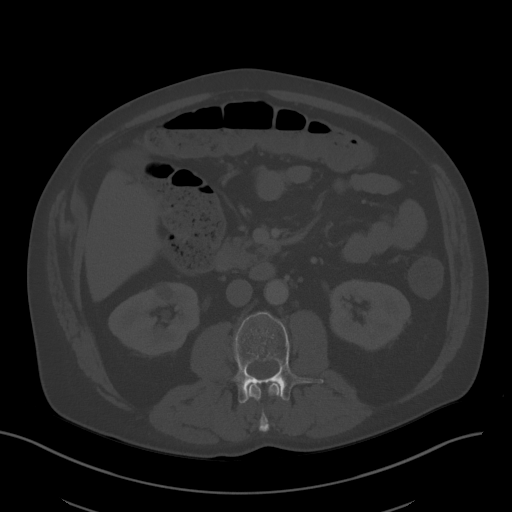
[im 65/93  soft-tissue]
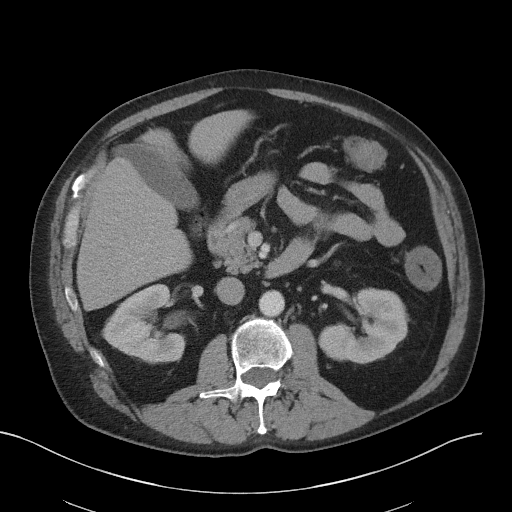
[im 71/93  soft-tissue]
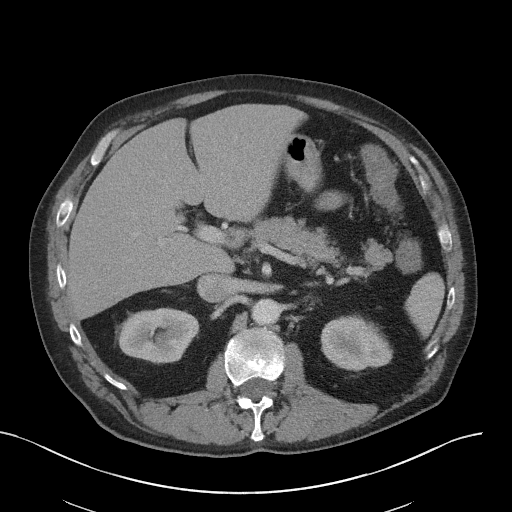
[im 82/93  soft-tissue]
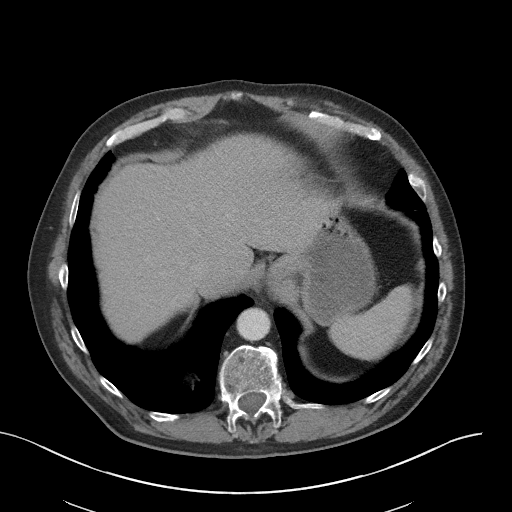
[im 87/93  soft-tissue]
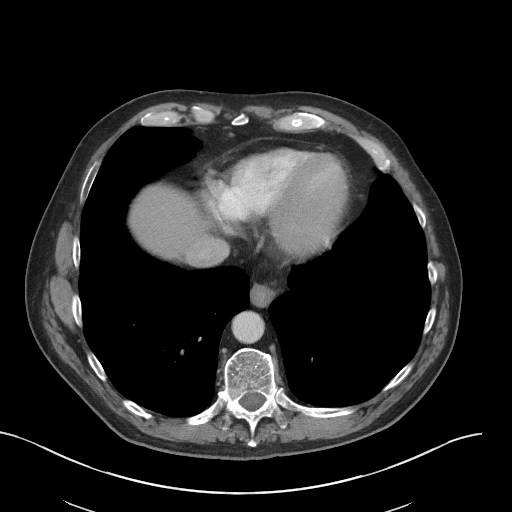

[Series 5: coronal st · coronal · 0.74mm/px · 3 of 104 slices shown]
[im 35/104  soft-tissue]
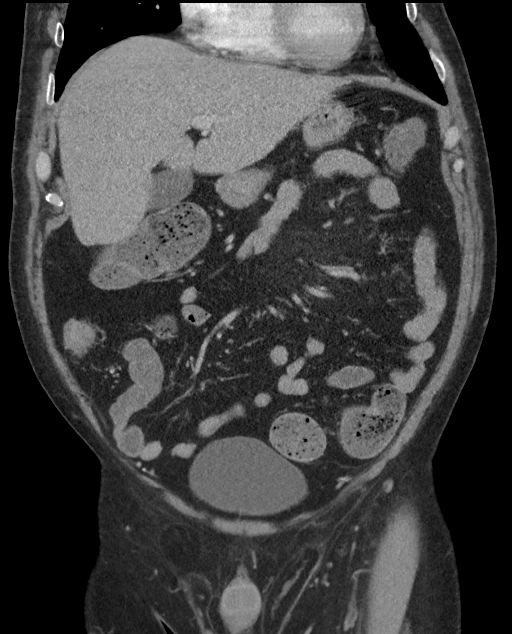
[im 46/104  soft-tissue]
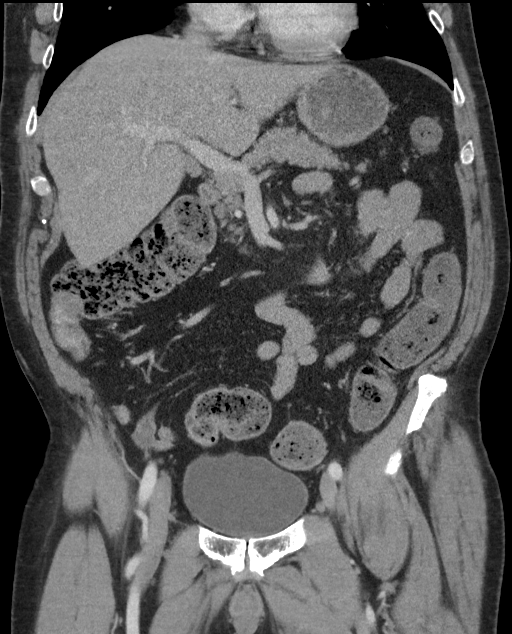
[im 58/104  soft-tissue]
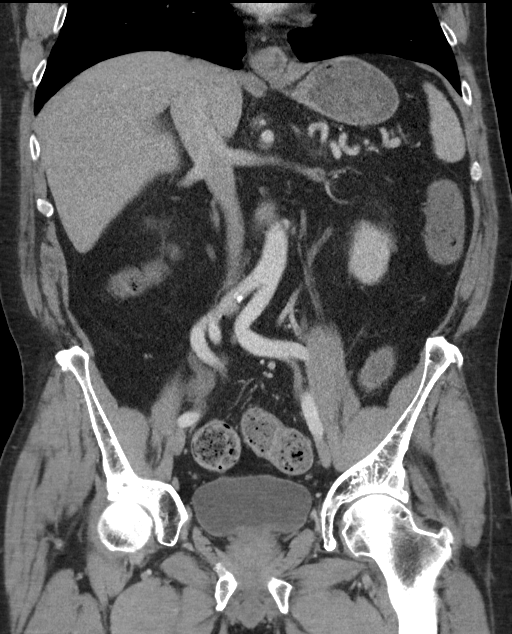

[16 of 46 positions shown; findings below may reference images not displayed]

FINDINGS: Lower chest:  Fatty Bochdalek's hernia on the right

Hepatobiliary: No focal liver abnormality.No evidence of biliary
obstruction or stone.

Pancreas: Unremarkable.

Spleen: Unremarkable.

Adrenals/Urinary Tract: Negative adrenals. No hydronephrosis or
stone. Right pelviectasis on portal venous phase becomes more
symmetric on delayed phase. No evidence of underlying obstructive
process. Small right renal cystic density. Unremarkable bladder.

Stomach/Bowel: No obstruction. Stool seen throughout most of the
colon without wall thickening or rectal over distension. Minimal
small bowel mesenteric fat haziness, likely incidental in isolation.
The appendix is not seen. No pericecal inflammation.

Vascular/Lymphatic: No acute vascular abnormality. Atherosclerotic
plaque. No mass or adenopathy.

Reproductive:Negative

Other: No ascites or pneumoperitoneum. Fatty periumbilical and right
inguinal hernias.

Musculoskeletal: No acute abnormalities.
IMPRESSION: 1. No acute finding.
2. Stool is present throughout most of the colon.
3. Umbilical and right inguinal fatty hernias.

## 2020-10-22 ENCOUNTER — Other Ambulatory Visit: Payer: Self-pay | Admitting: Family Medicine

## 2020-10-22 DIAGNOSIS — J4521 Mild intermittent asthma with (acute) exacerbation: Secondary | ICD-10-CM

## 2020-10-26 ENCOUNTER — Other Ambulatory Visit: Payer: Self-pay | Admitting: Family Medicine

## 2020-10-29 ENCOUNTER — Other Ambulatory Visit: Payer: Self-pay | Admitting: Endocrinology

## 2020-10-29 DIAGNOSIS — E0500 Thyrotoxicosis with diffuse goiter without thyrotoxic crisis or storm: Secondary | ICD-10-CM

## 2020-11-06 ENCOUNTER — Ambulatory Visit: Payer: Medicare HMO | Admitting: Endocrinology

## 2020-11-06 ENCOUNTER — Encounter: Payer: Self-pay | Admitting: Endocrinology

## 2020-11-06 ENCOUNTER — Other Ambulatory Visit: Payer: Self-pay

## 2020-11-06 VITALS — BP 132/86 | HR 64 | Ht 74.0 in | Wt 228.0 lb

## 2020-11-06 DIAGNOSIS — E0590 Thyrotoxicosis, unspecified without thyrotoxic crisis or storm: Secondary | ICD-10-CM | POA: Diagnosis not present

## 2020-11-06 LAB — TSH: TSH: 2.73 u[IU]/mL (ref 0.35–4.50)

## 2020-11-06 NOTE — Progress Notes (Signed)
Subjective:    Patient ID: Matthew Trujillo, male    DOB: 05/16/1951, 69 y.o.   MRN: [id number]  HPI Pt returns for f/u of hyperthyroidism (dx'ed with hyperthyroidism in 2017; US showed autoimmune thyroidtis; he declines RAI).  He takes tapazole 5 mg qd, as rx'ed.  pt still reports slight tremor of the hands, but no assoc palpitations.   Past Medical History:  Diagnosis Date  . ALLERGIC RHINITIS 05/21/2010  . ASTHMA 05/21/2010  . Asthma   . Factor V Leiden ([place])   . Hx of adenomatous colonic polyps   . HYPERTENSION 05/21/2010   Diffiult to control  . Hyperthyroidism   . Inguinal hernia   . Ischemic colitis ([place])   . Lower GI bleed   . Prolonged QT interval   . PULMONARY EMBOLISM 05/21/2010  . Umbilical hernia     Past Surgical History:  Procedure Laterality Date  . ACHILLES TENDON REPAIR    . COLONOSCOPY     last 2019, multiple  . DOPPLER ECHOCARDIOGRAPHY  12/30/2011   EF >55%; MILD LVH,MILD LAE,NORMAL DIASTOLIC FUNCTION  . RENAL DOPPLER  12/30/2011   NORMAL RENAL  . UMBILICAL HERNIA REPAIR N/A 01/04/2020   Procedure: UMBILICAL HERNIA REPAIR WITH MESH;  Surgeon: Jovita Kussmaul, MD;  Location: [facility];  Service: General;  Laterality: N/A;    Social History   Socioeconomic History  . Marital status: Married    Spouse name: Not on file  . Number of children: Not on file  . Years of education: Not on file  . Highest education level: Not on file  Occupational History  . Not on file  Tobacco Use  . Smoking status: Never Smoker  . Smokeless tobacco: Never Used  Vaping Use  . Vaping Use: Never used  Substance and Sexual Activity  . Alcohol use: Yes    Comment: occasional   . Drug use: No  . Sexual activity: Not on file  Other Topics Concern  . Not on file  Social History Narrative   He is a married father of 2. [employer] employee   He does exercise about 3-4 days a week with no true symptoms.    Does not smoke and occasional social alcohol.     Social Determinants of Health   Financial Resource Strain:   . Difficulty of Paying Living Expenses: Not on file  Food Insecurity:   . Worried About Charity fundraiser in the Last Year: Not on file  . Ran Out of Food in the Last Year: Not on file  Transportation Needs:   . Lack of Transportation (Medical): Not on file  . Lack of Transportation (Non-Medical): Not on file  Physical Activity:   . Days of Exercise per Week: Not on file  . Minutes of Exercise per Session: Not on file  Stress:   . Feeling of Stress : Not on file  Social Connections:   . Frequency of Communication with Friends and Family: Not on file  . Frequency of Social Gatherings with Friends and Family: Not on file  . Attends Religious Services: Not on file  . Active Member of Clubs or Organizations: Not on file  . Attends Archivist Meetings: Not on file  . Marital Status: Not on file  Intimate Partner Violence:   . Fear of Current or Ex-Partner: Not on file  . Emotionally Abused: Not on file  . Physically Abused: Not on file  . Sexually Abused: Not on file  Current Outpatient Medications on File Prior to Visit  Medication Sig Dispense Refill  . albuterol (PROVENTIL HFA;VENTOLIN HFA) 108 (90 Base) MCG/ACT inhaler INHALE 2 PUFFS EVERY 4 HOURS AS NEEDED WHEEZING (Patient taking differently: Inhale 2 puffs into the lungs every 4 (four) hours as needed for wheezing. ) 8.5 Inhaler 2  . albuterol (PROVENTIL) (2.5 MG/3ML) 0.083% nebulizer solution Take 3 mLs (2.5 mg total) by nebulization every 6 (six) hours as needed for wheezing or shortness of breath. 75 mL 12  . amLODipine (NORVASC) 10 MG tablet TAKE 1 TABLET BY MOUTH EVERY DAY 90 tablet 1  . apixaban (ELIQUIS) 5 MG TABS tablet Take 1 tablet (5 mg total) by mouth 2 (two) times daily. 180 tablet 3  . Calcium Carbonate-Vitamin D (CALTRATE 600+D) 600-400 MG-UNIT per tablet Take 1 tablet by mouth 2 (two) times daily.     . chlorthalidone (HYGROTON) 50 MG  tablet TAKE 1 TABLET BY MOUTH EVERY DAY 90 tablet 0  . Cholecalciferol (VITAMIN D-3) 5000 units TABS Take 5,000 Units by mouth daily.    Marland Kitchen FLOVENT HFA 110 MCG/ACT inhaler IMHALE 2 PUFFS BY MOUTH TWICE A DAY. RINSE MOUTH AFTER USE 36 each 0  . fluticasone (FLONASE) 50 MCG/ACT nasal spray PLACE 1 SPRAY INTO BOTH NOSTRILS DAILY. 48 mL 1  . methimazole (TAPAZOLE) 5 MG tablet TAKE 1 TABLET BY MOUTH EVERY DAY 90 tablet 1  . Multiple Vitamin (MULTIVITAMIN) tablet Take 1 tablet by mouth daily.      . Omega-3 Fatty Acids (FISH OIL) 1000 MG CAPS Take by mouth 2 (two) times daily.     . ramipril (ALTACE) 10 MG capsule TAKE 1 CAPSULE BY MOUTH EVERY DAY 90 capsule 3  . rosuvastatin (CRESTOR) 5 MG tablet Take 1 tablet (5 mg total) by mouth daily. 90 tablet 1  . Spacer/Aero-Holding Chambers (E-Z SPACER) inhaler Use as instructed 1 each 2   No current facility-administered medications on file prior to visit.    Allergies  Allergen Reactions  . Celebrex [Celecoxib]     Blood in stool  . Neosporin [Bacitracin-Polymyxin B] Rash    Family History  Problem Relation Age of Onset  . Kidney failure Father        single kidney; hx of recurrent infections  . Renal cancer Father   . Pancreatic cancer Brother   . Hypertension Mother   . Hypothyroidism Mother   . Hypothyroidism Sister   . Heart disease Neg Hx   . Colon cancer Neg Hx   . Esophageal cancer Neg Hx   . Stomach cancer Neg Hx   . Rectal cancer Neg Hx     BP 132/86   Pulse 64   Ht 6\' 2"  (1.88 m)   Wt 228 lb (103.4 kg)   SpO2 97%   BMI 29.27 kg/m    Review of Systems Denies fever.      Objective:   Physical Exam VITAL SIGNS:  See vs page GENERAL: no distress NECK: There is no palpable thyroid enlargement.  No thyroid nodule is palpable.  No palpable lymphadenopathy at the anterior neck.   Lab Results  Component Value Date   TSH 2.73 11/06/2020      Assessment & Plan:  Hyperthyroidism: well-controlled.  Please continue the  same methimazole

## 2020-11-06 NOTE — Patient Instructions (Addendum)
Blood tests are requested for you today.  We'll let you know about the results.   If ever you have fever while taking methimazole, stop it and call us, even if the reason is obvious, because of the risk of a rare side-effect.   It is best to never miss the medication.  However, if you do miss it, next best is to double up the next time.   The shakiness is not related to the thyroid.  Please come back for a follow-up appointment in 6 months.

## 2020-11-07 LAB — T4, FREE: Free T4: 0.81 ng/dL (ref 0.60–1.60)

## 2020-11-08 ENCOUNTER — Other Ambulatory Visit: Payer: Self-pay | Admitting: Cardiology

## 2020-11-12 ENCOUNTER — Other Ambulatory Visit: Payer: Self-pay | Admitting: Family Medicine

## 2020-11-24 ENCOUNTER — Encounter: Payer: Self-pay | Admitting: Family Medicine

## 2020-11-24 ENCOUNTER — Other Ambulatory Visit: Payer: Self-pay

## 2020-11-24 ENCOUNTER — Ambulatory Visit (INDEPENDENT_AMBULATORY_CARE_PROVIDER_SITE_OTHER): Payer: Medicare HMO | Admitting: Family Medicine

## 2020-11-24 VITALS — BP 132/80 | HR 72 | Temp 98.2°F | Ht 73.75 in | Wt 232.0 lb

## 2020-11-24 DIAGNOSIS — R251 Tremor, unspecified: Secondary | ICD-10-CM | POA: Diagnosis not present

## 2020-11-24 DIAGNOSIS — E0590 Thyrotoxicosis, unspecified without thyrotoxic crisis or storm: Secondary | ICD-10-CM | POA: Diagnosis not present

## 2020-11-24 DIAGNOSIS — Z0000 Encounter for general adult medical examination without abnormal findings: Secondary | ICD-10-CM | POA: Diagnosis not present

## 2020-11-24 DIAGNOSIS — D6851 Activated protein C resistance: Secondary | ICD-10-CM

## 2020-11-24 DIAGNOSIS — J309 Allergic rhinitis, unspecified: Secondary | ICD-10-CM | POA: Diagnosis not present

## 2020-11-24 DIAGNOSIS — E785 Hyperlipidemia, unspecified: Secondary | ICD-10-CM | POA: Diagnosis not present

## 2020-11-24 DIAGNOSIS — I10 Essential (primary) hypertension: Secondary | ICD-10-CM

## 2020-11-24 DIAGNOSIS — J4520 Mild intermittent asthma, uncomplicated: Secondary | ICD-10-CM

## 2020-11-24 DIAGNOSIS — Z86711 Personal history of pulmonary embolism: Secondary | ICD-10-CM

## 2020-11-24 NOTE — Progress Notes (Signed)
Matthew Trujillo DOB: 1951-05-30 Encounter date: 11/24/2020  This is a 69 y.o. male who presents for complete physical   History of present illness/Additional concerns: Last visit with me was 05/21/2020.  Has noticed in last couple of months that he gets shaky hands - noticed first when he had Graves disease. Talked with endo and they stated thyroid was ok. It is both hands. Notes sometimes in mornings when first getting up - notes most when fixing breakfast. Does continue. Writing is hard. Legs feel shaky as well. No headaches. No numbness/tingling. No weakness in arms. Not sure what else may have triggered it. Notes much more when doing something.   Hypertension: Amlodipine 10 mg, chlorthalidone 50 mg, ramipril 10 mg. Not checking at home regularly.  Hyperlipidemia:  Crestor 5 mg daily. This is only new medication. He did note tremor prior to statin.   Hyperthyroidism: Methimazole; managed by endocrinologist.   Had visits this month.  Continues on 5 mg daily dose.  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  Allergies: Flonase - allergies flared on Thanksgiving - around animals; did pretty well with meds.   Asthma: Flovent, albuterol. Breathing has been doing well. was a little wheezy on Thanksgiving but been ok since then.   Colonoscopy done 10/2018; repeat 11/21/23  Drinking less - one or two a week.   He is doing a hike in [place] in July. Plans to start exercising more regularly soon.   Past Medical History:  Diagnosis Date  . ALLERGIC RHINITIS 05/21/2010  . ASTHMA 05/21/2010  . Asthma   . Factor V Leiden ([place])   . Hx of adenomatous colonic polyps   . HYPERTENSION 05/21/2010   Diffiult to control  . Hyperthyroidism   . Inguinal hernia   . Ischemic colitis ([place])   . Lower GI bleed   . Prolonged QT interval   . PULMONARY EMBOLISM 05/21/2010  . Umbilical hernia    Past Surgical History:  Procedure Laterality Date  . ACHILLES TENDON REPAIR    . COLONOSCOPY      last 2019, multiple  . DOPPLER ECHOCARDIOGRAPHY  12/30/2011   EF >55%; MILD LVH,MILD LAE,NORMAL DIASTOLIC FUNCTION  . RENAL DOPPLER  12/30/2011   NORMAL RENAL  . UMBILICAL HERNIA REPAIR N/A 01/04/2020   Procedure: UMBILICAL HERNIA REPAIR WITH MESH;  Surgeon: Jovita Kussmaul, MD;  Location: [facility];  Service: General;  Laterality: N/A;   Allergies  Allergen Reactions  . Celebrex [Celecoxib]     Blood in stool  . Other     Animal dander causes sneezing and watery eyes  . Neosporin [Bacitracin-Polymyxin B] Rash   Current Meds  Medication Sig  . albuterol (PROVENTIL HFA;VENTOLIN HFA) 108 (90 Base) MCG/ACT inhaler INHALE 2 PUFFS EVERY 4 HOURS AS NEEDED WHEEZING (Patient taking differently: Inhale 2 puffs into the lungs every 4 (four) hours as needed for wheezing. )  . albuterol (PROVENTIL) (2.5 MG/3ML) 0.083% nebulizer solution Take 3 mLs (2.5 mg total) by nebulization every 6 (six) hours as needed for wheezing or shortness of breath.  Marland Kitchen amLODipine (NORVASC) 10 MG tablet TAKE 1 TABLET BY MOUTH EVERY DAY  . apixaban (ELIQUIS) 5 MG TABS tablet Take 1 tablet (5 mg total) by mouth 2 (two) times daily.  . Calcium Carbonate-Vitamin D (CALTRATE 600+D) 600-400 MG-UNIT per tablet Take 1 tablet by mouth 2 (two) times daily.   . chlorthalidone (HYGROTON) 50 MG tablet TAKE 1 TABLET BY MOUTH EVERY DAY  . Cholecalciferol (VITAMIN D-3)  5000 units TABS Take 5,000 Units by mouth daily.  Marland Kitchen FLOVENT HFA 110 MCG/ACT inhaler IMHALE 2 PUFFS BY MOUTH TWICE A DAY. RINSE MOUTH AFTER USE  . fluticasone (FLONASE) 50 MCG/ACT nasal spray PLACE 1 SPRAY INTO BOTH NOSTRILS DAILY.  . methimazole (TAPAZOLE) 5 MG tablet TAKE 1 TABLET BY MOUTH EVERY DAY  . Multiple Vitamin (MULTIVITAMIN) tablet Take 1 tablet by mouth daily.    . Omega-3 Fatty Acids (FISH OIL) 1000 MG CAPS Take by mouth 2 (two) times daily.   . ramipril (ALTACE) 10 MG capsule TAKE 1 CAPSULE BY MOUTH EVERY DAY  . rosuvastatin (CRESTOR) 5 MG tablet  TAKE 1 TABLET BY MOUTH EVERY DAY  . Spacer/Aero-Holding Chambers (E-Z SPACER) inhaler Use as instructed   Social History   Tobacco Use  . Smoking status: Never Smoker  . Smokeless tobacco: Never Used  Substance Use Topics  . Alcohol use: Yes    Comment: occasional    Family History  Problem Relation Age of Onset  . Kidney failure Father        single kidney; hx of recurrent infections  . Renal cancer Father   . Pancreatic cancer Brother   . Hypertension Mother   . Hypothyroidism Mother   . Hypothyroidism Sister   . Heart disease Neg Hx   . Colon cancer Neg Hx   . Esophageal cancer Neg Hx   . Stomach cancer Neg Hx   . Rectal cancer Neg Hx      Review of Systems  Constitutional: Negative for activity change, appetite change, chills, fatigue, fever and unexpected weight change.  HENT: Negative for congestion, ear pain, hearing loss, sinus pressure, sinus pain, sore throat and trouble swallowing.   Eyes: Negative for pain and visual disturbance.  Respiratory: Negative for cough, chest tightness, shortness of breath and wheezing.   Cardiovascular: Negative for chest pain, palpitations and leg swelling.  Gastrointestinal: Negative for abdominal distention, abdominal pain, blood in stool, constipation, diarrhea, nausea and vomiting.  Genitourinary: Negative for decreased urine volume, difficulty urinating, dysuria, penile pain and testicular pain.  Musculoskeletal: Negative for arthralgias, back pain and joint swelling.  Skin: Negative for rash.  Neurological: Negative for dizziness, weakness, numbness and headaches.  Hematological: Negative for adenopathy. Does not bruise/bleed easily.  Psychiatric/Behavioral: Negative for agitation, sleep disturbance and suicidal ideas. The patient is not nervous/anxious.     CBC:  Lab Results  Component Value Date   WBC 9.3 04/29/2020   HGB 15.6 04/29/2020   HCT 45.9 04/29/2020   MCH 30.2 08/10/2019   MCHC 34.1 04/29/2020   RDW 13.9  04/29/2020   PLT 328.0 04/29/2020   CMP: Lab Results  Component Value Date   NA 138 08/21/2020   K 4.1 08/21/2020   CL 101 08/21/2020   CO2 27 08/21/2020   ANIONGAP 12 01/01/2020   GLUCOSE 97 08/21/2020   BUN 23 08/21/2020   CREATININE 1.01 08/21/2020   GFRAA >60 01/01/2020   CALCIUM 9.5 08/21/2020   PROT 6.9 08/21/2020   BILITOT 1.1 08/21/2020   ALKPHOS 52 04/29/2020   ALT 31 08/21/2020   AST 21 08/21/2020   LIPID: Lab Results  Component Value Date   CHOL 130 08/21/2020   TRIG 109 08/21/2020   HDL 45 08/21/2020   LDLCALC 66 08/21/2020    Objective:  BP 132/80 (BP Location: Left Arm, Patient Position: Sitting, Cuff Size: Large)   Pulse 72   Temp 98.2 F (36.8 C) (Oral)   Ht 6' 1.75" (1.873  m)   Wt 232 lb (105.2 kg)   BMI 29.99 kg/m   Weight: 232 lb (105.2 kg)   BP Readings from Last 3 Encounters:  11/24/20 132/80  11/06/20 132/86  05/21/20 118/76   Wt Readings from Last 3 Encounters:  11/24/20 232 lb (105.2 kg)  11/06/20 228 lb (103.4 kg)  05/21/20 221 lb 3.2 oz (100.3 kg)    Physical Exam Constitutional:      General: He is not in acute distress.    Appearance: He is well-developed.  HENT:     Head: Normocephalic and atraumatic.     Right Ear: External ear normal.     Left Ear: External ear normal.     Nose: Nose normal.     Mouth/Throat:     Pharynx: No oropharyngeal exudate.  Eyes:     Conjunctiva/sclera: Conjunctivae normal.     Pupils: Pupils are equal, round, and reactive to light.  Neck:     Thyroid: No thyromegaly.  Cardiovascular:     Rate and Rhythm: Normal rate and regular rhythm.     Heart sounds: Normal heart sounds. No murmur heard.  No friction rub. No gallop.   Pulmonary:     Effort: Pulmonary effort is normal. No respiratory distress.     Breath sounds: Normal breath sounds. No stridor. No wheezing or rales.  Abdominal:     General: Bowel sounds are normal.     Palpations: Abdomen is soft.  Musculoskeletal:         General: Normal range of motion.     Cervical back: Neck supple.  Skin:    General: Skin is warm and dry.  Neurological:     Mental Status: He is alert and oriented to person, place, and time.     Cranial Nerves: Cranial nerves are intact.     Sensory: Sensation is intact.     Motor: Motor function is intact.     Coordination: Coordination is intact. Romberg sign negative.     Gait: Tandem walk abnormal.     Deep Tendon Reflexes:     Reflex Scores:      Tricep reflexes are 2+ on the right side and 2+ on the left side.      Bicep reflexes are 3+ on the right side and 3+ on the left side.      Brachioradialis reflexes are 3+ on the right side and 3+ on the left side.      Patellar reflexes are 3+ on the right side and 3+ on the left side.    Comments: He does have difficulty with balance with tandem walk; but can recover on own.  No joint rigidity, cogwheeling. There is baseline tremor noted hands, legs.   Psychiatric:        Behavior: Behavior normal.        Thought Content: Thought content normal.        Judgment: Judgment normal.     Assessment/Plan: There are no preventive care reminders to display for this patient. Health Maintenance reviewed.  1. Preventative health care Keep up with healthy eating and regular exercise.  He is up-to-date with preventative health care items.  2. Essential hypertension Blood pressures well controlled.  Continue current medications (amlodipine 10 mg, chlorthalidone 50 mg, ramipril 10 mg).  I do encourage home monitoring - CBC with Differential/Platelet; Future - Comprehensive metabolic panel; Future - Comprehensive metabolic panel - CBC with Differential/Platelet  3. Allergic rhinitis, unspecified seasonality, unspecified trigger Continue with Flonase; stable.  4. Asthma in adult, mild intermittent, uncomplicated Breathing has been stable.  Continues with Flovent inhaler.  Rare use of albuterol.  5. Hyperthyroidism Managed by  endocrinology and has been stable on methimazole 5 mg daily.  6. Factor V Leiden ([place]) Has done well with Eliquis 5 mg twice daily.  History of recurrent DVT as well as PE.  7. Hyperlipidemia with target LDL less than 100 Continue with Crestor 5 mg daily.  Cholesterols improved significantly with this.  8. History of pulmonary embolism See above.  On Eliquis.  9. Tremor Start with blood work.  Further evaluation pending these results. - Ammonia; Future - Ceruloplasmin; Future - CBC with Differential/Platelet; Future - Comprehensive metabolic panel; Future - Comprehensive metabolic panel - CBC with Differential/Platelet - Ceruloplasmin - Ammonia  Return for pending bloodwork.  Micheline Rough, MD

## 2020-11-25 ENCOUNTER — Encounter: Payer: Self-pay | Admitting: Family Medicine

## 2020-11-25 LAB — CBC WITH DIFFERENTIAL/PLATELET
Absolute Monocytes: 672 cells/uL (ref 200–950)
Basophils Absolute: 101 cells/uL (ref 0–200)
Basophils Relative: 1.1 %
Eosinophils Absolute: 193 cells/uL (ref 15–500)
Eosinophils Relative: 2.1 %
HCT: 49.2 % (ref 38.5–50.0)
Hemoglobin: 16.6 g/dL (ref 13.2–17.1)
Lymphs Abs: 3708 cells/uL (ref 850–3900)
MCH: 30.6 pg (ref 27.0–33.0)
MCHC: 33.7 g/dL (ref 32.0–36.0)
MCV: 90.8 fL (ref 80.0–100.0)
MPV: 10.8 fL (ref 7.5–12.5)
Monocytes Relative: 7.3 %
Neutro Abs: 4526 cells/uL (ref 1500–7800)
Neutrophils Relative %: 49.2 %
Platelets: 329 10*3/uL (ref 140–400)
RBC: 5.42 10*6/uL (ref 4.20–5.80)
RDW: 13.1 % (ref 11.0–15.0)
Total Lymphocyte: 40.3 %
WBC: 9.2 10*3/uL (ref 3.8–10.8)

## 2020-11-25 LAB — COMPREHENSIVE METABOLIC PANEL
AG Ratio: 1.9 (calc) (ref 1.0–2.5)
ALT: 43 U/L (ref 9–46)
AST: 24 U/L (ref 10–35)
Albumin: 4.6 g/dL (ref 3.6–5.1)
Alkaline phosphatase (APISO): 45 U/L (ref 35–144)
BUN: 21 mg/dL (ref 7–25)
CO2: 28 mmol/L (ref 20–32)
Calcium: 10 mg/dL (ref 8.6–10.3)
Chloride: 101 mmol/L (ref 98–110)
Creat: 0.91 mg/dL (ref 0.70–1.25)
Globulin: 2.4 g/dL (calc) (ref 1.9–3.7)
Glucose, Bld: 104 mg/dL — ABNORMAL HIGH (ref 65–99)
Potassium: 4 mmol/L (ref 3.5–5.3)
Sodium: 139 mmol/L (ref 135–146)
Total Bilirubin: 1 mg/dL (ref 0.2–1.2)
Total Protein: 7 g/dL (ref 6.1–8.1)

## 2020-11-25 LAB — CERULOPLASMIN: Ceruloplasmin: 24 mg/dL (ref 18–36)

## 2020-11-25 LAB — AMMONIA: Ammonia: 45 umol/L (ref <=72)

## 2020-11-26 ENCOUNTER — Other Ambulatory Visit: Payer: Self-pay | Admitting: Family Medicine

## 2020-11-26 MED ORDER — ALBUTEROL SULFATE HFA 108 (90 BASE) MCG/ACT IN AERS: 2.0000 | INHALATION_SPRAY | RESPIRATORY_TRACT | 2 refills | Status: DC | PRN

## 2020-12-04 NOTE — Addendum Note (Signed)
Addended by: Agnes Lawrence on: 12/04/2020 03:41 PM   Modules accepted: Orders

## 2020-12-04 NOTE — Addendum Note (Signed)
Addended by: Agnes Lawrence on: 12/04/2020 03:39 PM   Modules accepted: Orders

## 2020-12-05 NOTE — Progress Notes (Signed)
Assessment/Plan:   1.   Essential Tremor.  -This is evidenced by the symmetrical nature and hx of gradually getting worse.  We discussed nature and pathophysiology.  We discussed that this can continue to gradually get worse with time.  We discussed that some medications can worsen this, as can caffeine use.  We discussed medication therapy as well as surgical therapy.  Unfortunately, he cannot be on primidone because of interaction with Eliquis.  In addition, he was taken off of metoprolol in the past because of his asthma, so I am not sure that beta-blockers are an option for him.  Ultimately, therefore, we decided on topiramate.  We discussed extensively risks, benefits, and side effects of topiramate.  He expressed understanding.  We will take a month to work up to 100 mg daily.  This may not be enough, but it is a good starting dose.   2.  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  Wonder if savaysa is an option in the future.  Patient asked about Coumadin.  We can use Coumadin in the face of primidone, despite the interaction, because of the fact that we can monitor the warfarin.  For now, no changes will be made.  Subjective:   Matthew Trujillo was seen today in the movement disorders clinic for neurologic consultation at the request of Koberlein, Steele Berg, MD.  The consultation is for the evaluation of tremor.  Patient does have a history of Graves' disease, but it is well controlled.  He asked Dr. Loanne Drilling if it was associated with the Graves' disease, and he told him no.  Patients thyroid function has normalized on methimazole.  Tremor: Yes.     How long has it been going on? 1 year  At rest or with activation?  activation  When is it noted the most?  In the AM  Fam hx of tremor?  No.  Located where?  Bilateral upper extremities and bilateral LE (started on both sides and both sides are same).  He is R handed  Affected by caffeine:  No. (3-4 cups tea/1  soda per day)  Affected by alcohol:  No. (drinks 2-3 drinks/week)  Affected by stress:  Yes.    Affected by fatigue:  No.  Spills soup if on spoon:  No.  Spills glass of liquid if full:  No.  Affects ADL's (tying shoes, brushing teeth, etc):  No.  Tremor inducing meds:  Yes.  , But rarely uses the albuterol.  On Flovent  Other Specific Symptoms:  Voice: no change Sleep: some trouble staying asleep (some b/c of RR)  Vivid Dreams:  No.  Acting out dreams:  No. Postural symptoms:  Yes.    Falls?  No. Bradykinesia symptoms: no bradykinesia noted Loss of smell:  No. Loss of taste:  No. Urinary Incontinence:  No. Difficulty Swallowing:  No. Handwriting, micrographia: No. Memory changes:  No. N/V:  No. Lightheaded:  No.  Syncope: No. Diplopia:  No. Dyskinesia:  No.  Neuroimaging of the brain has not previously been performed.  It has been ordered by primary care, but not yet completed.    ALLERGIES:   Allergies  Allergen Reactions  . Celebrex [Celecoxib]     Blood in stool  . Other     Animal dander causes sneezing and watery eyes  . Neosporin [Bacitracin-Polymyxin B] Rash    CURRENT MEDICATIONS:  Current Outpatient Medications  Medication Instructions  . albuterol (PROVENTIL) 2.5 mg,  Nebulization, Every 6 hours PRN  . albuterol (VENTOLIN HFA) 108 (90 Base) MCG/ACT inhaler 2 puffs, Inhalation, Every 4 hours PRN  . amLODipine (NORVASC) 10 MG tablet TAKE 1 TABLET BY MOUTH EVERY DAY  . apixaban (ELIQUIS) 5 mg, Oral, 2 times daily  . Calcium Carbonate-Vitamin D 600-400 MG-UNIT tablet 1 tablet, Oral, 2 times daily,    . chlorthalidone (HYGROTON) 50 MG tablet TAKE 1 TABLET BY MOUTH EVERY DAY  . FLOVENT HFA 110 MCG/ACT inhaler IMHALE 2 PUFFS BY MOUTH TWICE A DAY. RINSE MOUTH AFTER USE  . fluticasone (FLONASE) 50 MCG/ACT nasal spray 1 spray, Each Nare, Daily  . methimazole (TAPAZOLE) 5 MG tablet TAKE 1 TABLET BY MOUTH EVERY DAY  . Multiple Vitamin (MULTIVITAMIN) tablet 1  tablet, Oral, Daily,    . Omega-3 Fatty Acids (FISH OIL) 1000 MG CAPS Oral, 2 times daily  . ramipril (ALTACE) 10 MG capsule TAKE 1 CAPSULE BY MOUTH EVERY DAY  . rosuvastatin (CRESTOR) 5 MG tablet TAKE 1 TABLET BY MOUTH EVERY DAY  . Spacer/Aero-Holding Chambers (E-Z SPACER) inhaler Use as instructed  . Vitamin D-3 5,000 Units, Oral, Daily    Objective:   PHYSICAL EXAMINATION:    VITALS:   Vitals:   12/08/20 0854  BP: 130/70  Pulse: 81  Resp: 20  SpO2: 96%  Weight: 231 lb (104.8 kg)  Height: 6\' 2"  (1.88 m)    GEN:  The patient appears stated age and is in NAD. HEENT:  Normocephalic, atraumatic.  The mucous membranes are moist. The superficial temporal arteries are without ropiness or tenderness. CV:  RRR Lungs:  CTAB Neck/HEME:  There are no carotid bruits bilaterally.  Neurological examination:  Orientation: The patient is alert and oriented x3.  Cranial nerves: There is good facial symmetry.  Extraocular muscles are intact. The visual fields are full to confrontational testing. The speech is fluent and clear. Soft palate rises symmetrically and there is no tongue deviation. Hearing is intact to conversational tone. Sensation: Sensation is intact to light touch throughout (facial, trunk, extremities). Vibration is intact at the bilateral big toe. There is no extinction with double simultaneous stimulation.  Motor: Strength is 5/5 in the bilateral upper and lower extremities.   Shoulder shrug is equal and symmetric.  There is no pronator drift. Deep tendon reflexes: Deep tendon reflexes are 2+-3/4 at the bilateral biceps, triceps, brachioradialis, patella and achilles. Plantar responses are downgoing bilaterally.  Movement examination: Tone: There is normal tone in the bilateral upper extremities.  The tone in the lower extremities is normal.  Abnormal movements: There is no rest tremor.  There is postural tremor bilaterally.  There is right greater than left intention tremor.   He has mild tremor with Archimedes spirals bilaterally. Coordination:  There is no decremation with RAM's Gait and Station: The patient has no difficulty arising out of a deep-seated chair without the use of the hands. The patient's stride length is good.    I have reviewed and interpreted the following labs independently   Chemistry      Component Value Date/Time   NA 139 11/24/2020 0932   K 4.0 11/24/2020 0932   CL 101 11/24/2020 0932   CO2 28 11/24/2020 0932   BUN 21 11/24/2020 0932   CREATININE 0.91 11/24/2020 0932      Component Value Date/Time   CALCIUM 10.0 11/24/2020 0932   ALKPHOS 52 04/29/2020 0753   AST 24 11/24/2020 0932   ALT 43 11/24/2020 0932   BILITOT 1.0 11/24/2020  0932      Lab Results  Component Value Date   TSH 2.73 11/06/2020   Lab Results  Component Value Date   WBC 9.2 11/24/2020   HGB 16.6 11/24/2020   HCT 49.2 11/24/2020   MCV 90.8 11/24/2020   PLT 329 11/24/2020      Total time spent on today's visit was 60 minutes, including both face-to-face time and nonface-to-face time.  Time included that spent on review of records (prior notes available to me/labs/imaging if pertinent), discussing treatment and goals, answering patient's questions and coordinating care.  Cc:  Caren Macadam, MD

## 2020-12-08 ENCOUNTER — Other Ambulatory Visit: Payer: Self-pay | Admitting: Neurology

## 2020-12-08 ENCOUNTER — Ambulatory Visit: Payer: Medicare HMO | Admitting: Neurology

## 2020-12-08 ENCOUNTER — Other Ambulatory Visit: Payer: Self-pay

## 2020-12-08 ENCOUNTER — Encounter: Payer: Self-pay | Admitting: Neurology

## 2020-12-08 VITALS — BP 130/70 | HR 81 | Resp 20 | Ht 74.0 in | Wt 231.0 lb

## 2020-12-08 DIAGNOSIS — I82402 Acute embolism and thrombosis of unspecified deep veins of left lower extremity: Secondary | ICD-10-CM | POA: Diagnosis not present

## 2020-12-08 DIAGNOSIS — D6851 Activated protein C resistance: Secondary | ICD-10-CM | POA: Diagnosis not present

## 2020-12-08 DIAGNOSIS — G250 Essential tremor: Secondary | ICD-10-CM

## 2020-12-08 DIAGNOSIS — Z86711 Personal history of pulmonary embolism: Secondary | ICD-10-CM | POA: Diagnosis not present

## 2020-12-08 MED ORDER — TOPIRAMATE 25 MG PO CPSP: ORAL_CAPSULE | ORAL | 0 refills | Status: DC

## 2020-12-08 MED ORDER — TOPIRAMATE 100 MG PO TABS: 100.0000 mg | ORAL_TABLET | Freq: Every day | ORAL | 1 refills | Status: DC

## 2020-12-08 NOTE — Patient Instructions (Addendum)
Start topamax (topiramate) 25 mg, 1 tablet daily for 1 week, then 2 tablets daily for 1 week and then 3 tablets daily for 1 week and then that RX will run out and you will take topamax 100mg , 1 tablet daily.  The physicians and staff at [facility] are committed to providing excellent care. You may receive a survey requesting feedback about your experience at our office. We strive to receive "very good" responses to the survey questions. If you feel that your experience would prevent you from giving the office a "very good " response, please contact our office to try to remedy the situation. We may be reached at [phone]. Thank you for taking the time out of your busy day to complete the survey.   Essential Tremor A tremor is trembling or shaking that a person cannot control. Most tremors affect the hands or arms. Tremors can also affect the head, vocal cords, legs, and other parts of the body. Essential tremor is a tremor without a known cause. Usually, it occurs while a person is trying to perform an action. It tends to get worse gradually as a person ages. What are the causes? The cause of this condition is not known. What increases the risk? You are more likely to develop this condition if:  You have a family member with essential tremor.  You are age 67 or older.  You take certain medicines. What are the signs or symptoms? The main sign of a tremor is a rhythmic shaking of certain parts of your body that is uncontrolled and unintentional. You may:  Have difficulty eating with a spoon or fork.  Have difficulty writing.  Nod your head up and down or side to side.  Have a quivering voice. The shaking may:  Get worse over time.  Come and go.  Be more noticeable on one side of your body.  Get worse due to stress, fatigue, caffeine, and extreme heat or cold. How is this diagnosed? This condition may be diagnosed based on:  Your symptoms and medical history.  A  physical exam. There is no single test to diagnose an essential tremor. However, your health care provider may order tests to rule out other causes of your condition. These may include:  Blood and urine tests.  Imaging studies of your brain, such as CT scan and MRI.  A test that measures involuntary muscle movement (electromyogram). How is this treated? Treatment for essential tremor depends on the severity of the condition.  Some tremors may go away without treatment.  Mild tremors may not need treatment if they do not affect your day-to-day life.  Severe tremors may need to be treated using one or more of the following options: ? Medicines. ? Lifestyle changes. ? Occupational or physical therapy. Follow these instructions at home: Lifestyle   Do not use any products that contain nicotine or tobacco, such as cigarettes and e-cigarettes. If you need help quitting, ask your health care provider.  Limit your caffeine intake as told by your health care provider.  Try to get 8 hours of sleep each night.  Find ways to manage your stress that fits your lifestyle and personality. Consider trying meditation or yoga.  Try to anticipate stressful situations and allow extra time to manage them.  If you are struggling emotionally with the effects of your tremor, consider working with a mental health provider. General instructions  Take over-the-counter and prescription medicines only as told by your health care provider.  Avoid  extreme heat and extreme cold.  Keep all follow-up visits as told by your health care provider. This is important. Visits may include physical therapy visits. Contact a health care provider if:  You experience any changes in the location or intensity of your tremors.  You start having a tremor after starting a new medicine.  You have tremor with other symptoms, such as: ? Numbness. ? Tingling. ? Pain. ? Weakness.  Your tremor gets worse.  Your tremor  interferes with your daily life.  You feel down, blue, or sad for at least 2 weeks in a row.  Worrying about your tremor and what other people think about you interferes with your everyday life functions, including relationships, work, or school. Summary  Essential tremor is a tremor without a known cause. Usually, it occurs when you are trying to perform an action.  The cause of this condition is not known.  The main sign of a tremor is a rhythmic shaking of certain parts of your body that is uncontrolled and unintentional.  Treatment for essential tremor depends on the severity of the condition. This information is not intended to replace advice given to you by your health care provider. Make sure you discuss any questions you have with your health care provider. Document Revised: 12/23/2017 Document Reviewed: 12/23/2017 Elsevier Patient Education  2020 [employer].

## 2021-01-21 ENCOUNTER — Other Ambulatory Visit: Payer: Self-pay | Admitting: Family Medicine

## 2021-01-21 DIAGNOSIS — J4521 Mild intermittent asthma with (acute) exacerbation: Secondary | ICD-10-CM

## 2021-02-04 DIAGNOSIS — L905 Scar conditions and fibrosis of skin: Secondary | ICD-10-CM | POA: Diagnosis not present

## 2021-02-04 DIAGNOSIS — L570 Actinic keratosis: Secondary | ICD-10-CM | POA: Diagnosis not present

## 2021-02-04 DIAGNOSIS — L819 Disorder of pigmentation, unspecified: Secondary | ICD-10-CM | POA: Diagnosis not present

## 2021-02-04 DIAGNOSIS — L738 Other specified follicular disorders: Secondary | ICD-10-CM | POA: Diagnosis not present

## 2021-02-04 DIAGNOSIS — L821 Other seborrheic keratosis: Secondary | ICD-10-CM | POA: Diagnosis not present

## 2021-02-04 DIAGNOSIS — L814 Other melanin hyperpigmentation: Secondary | ICD-10-CM | POA: Diagnosis not present

## 2021-02-04 DIAGNOSIS — Z85828 Personal history of other malignant neoplasm of skin: Secondary | ICD-10-CM | POA: Diagnosis not present

## 2021-02-05 ENCOUNTER — Encounter: Payer: Self-pay | Admitting: Family Medicine

## 2021-02-09 ENCOUNTER — Other Ambulatory Visit: Payer: Self-pay | Admitting: Family Medicine

## 2021-02-09 MED ORDER — EDOXABAN TOSYLATE 60 MG PO TABS: 60.0000 mg | ORAL_TABLET | Freq: Every day | ORAL | 5 refills | Status: DC

## 2021-02-10 ENCOUNTER — Other Ambulatory Visit: Payer: Self-pay | Admitting: Family Medicine

## 2021-02-12 ENCOUNTER — Telehealth: Payer: Self-pay | Admitting: Family Medicine

## 2021-02-12 NOTE — Telephone Encounter (Signed)
Hi - any chance you can help with this patient? He was wanting to get on alternative anticoag med due to issues with interaction with medications from neurology (wanting to try primidone). Per neuro only med options without interaction for this were coumadin (which he had a very difficult time with in the past maintaining stable INR) or Savaysa (which insurance is saying isn't covered). Would appreciate your input!

## 2021-02-12 NOTE — Telephone Encounter (Signed)
Left message for patient to call back and schedule Medicare Annual Wellness Visit (AWV) either virtually or in office. No detailed message left   Last AWVI no information  please schedule at anytime with [facility] Nurse Health Advisor 1 or 2   This should be a 45 minute visit. 

## 2021-02-12 NOTE — Telephone Encounter (Signed)
Hi Dr. Carles Collet - I was trying to work with [name] on his anticoagulation so that he could have different treatment options with you. His insurance won't cover the savaysa but pradaxa is covered. I just wanted to confirm it was the primidone you were wanted to try for him and wondering if that was the case if we might try pradaxa since it does not have an interaction with the primidone? If needed I can reach out to our pharmacist to help with coverage. He didn't do well on coumadin in past (just hard to regulate for him).   Thanks! Junell

## 2021-02-12 NOTE — Telephone Encounter (Signed)
Sorry for earlier message; i'm going to check with neuro to confirm the primidone is what she is wanting to try; looks like pradaxa is covered and doesn't interact with primidone. I'll reach back out if I still need your help!

## 2021-02-13 ENCOUNTER — Other Ambulatory Visit: Payer: Self-pay | Admitting: Family Medicine

## 2021-02-13 NOTE — Telephone Encounter (Signed)
Can you let the patient know about Maddie's message? Not sure if this would be acceptable for him or not. If not acceptable seems that only other option would be to try coumadin; maybe we could get him set up with home monitoring to make things easier for him.  Just let me know his choice.

## 2021-02-13 NOTE — Telephone Encounter (Signed)
Unable to reach patient due to bad connection at the home number.

## 2021-02-13 NOTE — Telephone Encounter (Signed)
Hi - are you able to help with the savaysa for this patient? Sorry for the multiple messages. Please see what Dr. Carles Collet had responded. Appreciate it!

## 2021-02-13 NOTE — Telephone Encounter (Signed)
Savaysa does not have a patient assistance program at this time. I called the patient's insurance to start a formulary exception and it was approved. The reference number they provided was #[id number]. The cost is going to be $100 for 30 days and I don't think it will go down from there.

## 2021-02-16 ENCOUNTER — Encounter: Payer: Self-pay | Admitting: Family Medicine

## 2021-02-17 NOTE — Progress Notes (Signed)
Virtual Visit Via Video   The purpose of this virtual visit is to provide medical care while limiting exposure to the novel coronavirus.    Consent was obtained for video visit:  Yes.   Answered questions that patient had about telehealth interaction:  Yes.   I discussed the limitations, risks, security and privacy concerns of performing an evaluation and management service by telemedicine. I also discussed with the patient that there may be a patient responsible charge related to this service. The patient expressed understanding and agreed to proceed.  Pt location: Home Physician Location: office Name of referring provider:  Caren Macadam, MD I connected with [name] at patients initiation/request on 02/18/2021 at  8:45 AM EST by video enabled telemedicine application and verified that I am speaking with the correct person using two identifiers. Pt MRN:  [id number] Pt DOB:  13-Feb-1951 Video Participants:  [name];    Assessment/Plan:   1.  Essential tremor  -Start primidone, 50 mg, half tablet at bedtime for 1 week and then 1 tablet at bedtime for 1 week and then, if tolerated he will take 1 tablet twice per day thereafter.  -The above instructions were sent via MyChart to the patient.  -Patient will start the above after he finishes weaning off of topiramate (has 2 days left)  2.  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  Patient options for tremor last visit was somewhat limited due to interaction with Eliquis and the fact that he was taken off metoprolol in the past because of asthma.  We started him on topiramate.  He experienced cognitive change and fatigue and depression on this.  We ended up weaning him off because of it.  He will be weaned off of it in 2 days.  He is now on savaysa    Prior meds: Topiramate (cognitive change); metoprolol (taken off in the  past because of asthma)   Current Outpatient Medications on File Prior to Visit  Medication Sig Dispense Refill  . albuterol (PROVENTIL) (2.5 MG/3ML) 0.083% nebulizer solution Take 3 mLs (2.5 mg total) by nebulization every 6 (six) hours as needed for wheezing or shortness of breath. 75 mL 12  . albuterol (VENTOLIN HFA) 108 (90 Base) MCG/ACT inhaler Inhale 2 puffs into the lungs every 4 (four) hours as needed for wheezing. 18 g 2  . amLODipine (NORVASC) 10 MG tablet TAKE 1 TABLET BY MOUTH EVERY DAY 90 tablet 1  . Calcium Carbonate-Vitamin D 600-400 MG-UNIT tablet Take 1 tablet by mouth 2 (two) times daily.    . chlorthalidone (HYGROTON) 50 MG tablet TAKE 1 TABLET BY MOUTH EVERY DAY 90 tablet 0  . Cholecalciferol (VITAMIN D-3) 5000 units TABS Take 5,000 Units by mouth daily.    Marland Kitchen FLOVENT HFA 110 MCG/ACT inhaler INHALE 2 PUFFS BY MOUTH TWICE A DAY. RINSE MOUTH AFTER USE 36 each 0  . fluticasone (FLONASE) 50 MCG/ACT nasal spray PLACE 1 SPRAY INTO BOTH NOSTRILS DAILY. 48 mL 1  . methimazole (TAPAZOLE) 5 MG tablet TAKE 1 TABLET BY MOUTH EVERY DAY 90 tablet 1  . Multiple Vitamin (MULTIVITAMIN) tablet Take 1 tablet by mouth daily.    . Omega-3 Fatty Acids (FISH OIL) 1000 MG CAPS Take by mouth 2 (two) times daily.    . ramipril (ALTACE) 10 MG capsule TAKE 1 CAPSULE BY MOUTH EVERY DAY 90 capsule 3  .  rosuvastatin (CRESTOR) 5 MG tablet TAKE 1 TABLET BY MOUTH EVERY DAY 90 tablet 1  . SAVAYSA 60 MG TABS tablet TAKE 1 TABLET BY MOUTH EVERY DAY 30 tablet 5  . Spacer/Aero-Holding Chambers (E-Z SPACER) inhaler Use as instructed 1 each 2  . topiramate (TOPAMAX) 100 MG tablet Take 1 tablet (100 mg total) by mouth daily. 90 tablet 1  . topiramate (TOPAMAX) 25 MG capsule 1 po q day x 1 week, then 2 po q day x 1 week, then 3 po q day x 1 week 42 capsule 0   No current facility-administered medications on file prior to visit.     Objective   There were no vitals filed for this visit. GEN:  The patient appears  stated age and is in NAD.  Neurological examination:  Orientation: The patient is alert and oriented x3. Cranial nerves: There is good facial symmetry. There is facial hypomimia.  The speech is fluent and clear. Soft palate rises symmetrically and there is no tongue deviation. Hearing is intact to conversational tone. Motor: Strength is at least antigravity x 4.   Shoulder shrug is equal and symmetric.  There is no pronator drift.  Movement examination: Tone: unable Abnormal movements: No rest tremor.  There is right greater than left intention tremor, overall mild.     Follow up Instructions      -I discussed the assessment and treatment plan with the patient. The patient was provided an opportunity to ask questions and all were answered. The patient agreed with the plan and demonstrated an understanding of the instructions.   The patient was advised to call back or seek an in-person evaluation if the symptoms worsen or if the condition fails to improve as anticipated.    Total time spent on today's visit was 41minutes, including both face-to-face time and nonface-to-face time.  Time included that spent on review of records (prior notes available to me/labs/imaging if pertinent), discussing treatment and goals, answering patient's questions and coordinating care.   Alonza Bogus, DO

## 2021-02-18 ENCOUNTER — Telehealth (INDEPENDENT_AMBULATORY_CARE_PROVIDER_SITE_OTHER): Payer: Medicare HMO | Admitting: Neurology

## 2021-02-18 ENCOUNTER — Encounter: Payer: Self-pay | Admitting: Neurology

## 2021-02-18 ENCOUNTER — Other Ambulatory Visit: Payer: Self-pay

## 2021-02-18 VITALS — Ht 74.0 in | Wt 220.0 lb

## 2021-02-18 DIAGNOSIS — G250 Essential tremor: Secondary | ICD-10-CM | POA: Diagnosis not present

## 2021-02-18 DIAGNOSIS — D6851 Activated protein C resistance: Secondary | ICD-10-CM | POA: Diagnosis not present

## 2021-02-18 MED ORDER — PRIMIDONE 50 MG PO TABS: 50.0000 mg | ORAL_TABLET | Freq: Two times a day (BID) | ORAL | 1 refills | Status: DC

## 2021-02-19 ENCOUNTER — Telehealth: Payer: Self-pay

## 2021-02-19 NOTE — Telephone Encounter (Signed)
Received a call from [facility] pharmacy stating there is a drug interaction between Eliquis and Primidone. She states Primidone can decrease effectiveness of Eliquis. She states she will hold the Primidone until she hears back from our office.

## 2021-02-19 NOTE — Telephone Encounter (Signed)
The pharmacist needs to update the patients information then.  He isn't on eliquis.  He is on savayasa now.

## 2021-02-20 NOTE — Telephone Encounter (Signed)
Spoke with patient and he confirmed he is not taking Eliquis.   Spoke with pharmacy and made her aware that the patient is no longer taking Eliquis and has been switched to [facility] Pharmacy tech states she will make the pharmacy aware and thanked me for call.

## 2021-03-10 ENCOUNTER — Other Ambulatory Visit: Payer: Self-pay | Admitting: Endocrinology

## 2021-03-10 ENCOUNTER — Other Ambulatory Visit: Payer: Self-pay | Admitting: Family Medicine

## 2021-03-10 ENCOUNTER — Other Ambulatory Visit: Payer: Self-pay | Admitting: Cardiology

## 2021-03-10 DIAGNOSIS — E0500 Thyrotoxicosis with diffuse goiter without thyrotoxic crisis or storm: Secondary | ICD-10-CM

## 2021-05-07 ENCOUNTER — Other Ambulatory Visit: Payer: Self-pay | Admitting: Cardiology

## 2021-05-07 ENCOUNTER — Ambulatory Visit: Payer: Medicare HMO | Admitting: Endocrinology

## 2021-05-07 ENCOUNTER — Other Ambulatory Visit: Payer: Self-pay

## 2021-05-07 VITALS — BP 140/76 | HR 65 | Ht 74.0 in | Wt 224.4 lb

## 2021-05-07 DIAGNOSIS — E0500 Thyrotoxicosis with diffuse goiter without thyrotoxic crisis or storm: Secondary | ICD-10-CM

## 2021-05-07 DIAGNOSIS — E0590 Thyrotoxicosis, unspecified without thyrotoxic crisis or storm: Secondary | ICD-10-CM | POA: Diagnosis not present

## 2021-05-07 LAB — TSH: TSH: 2.31 u[IU]/mL (ref 0.35–4.50)

## 2021-05-07 LAB — T4, FREE: Free T4: 0.74 ng/dL (ref 0.60–1.60)

## 2021-05-07 NOTE — Patient Instructions (Addendum)
Blood tests are requested for you today.  We'll let you know about the results.  If ever you have fever while taking methimazole, stop it and call us, even if the reason is obvious, because of the risk of a rare side-effect. It is best to never miss the medication.  However, if you do miss it, next best is to double up the next time.   Please come back for a follow-up appointment in 8 months.   

## 2021-05-07 NOTE — Progress Notes (Signed)
Subjective:    Patient ID: Matthew Trujillo, male    DOB: [birth year], 70 y.o.   MRN: [id number]  HPI Pt returns for f/u of hyperthyroidism (dx'ed with hyperthyroidism in 2017; US showed autoimmune thyroidtis; he declines RAI).  He takes tapazole 5 mg qd, as rx'ed.  pt states he feels well in general.  Specifically, she denies palpitations and tremor.   Past Medical History:  Diagnosis Date  . ALLERGIC RHINITIS 05/21/2010  . ASTHMA 05/21/2010  . Asthma   . Factor V Leiden ([place])   . Hx of adenomatous colonic polyps   . HYPERTENSION 05/21/2010   Diffiult to control  . Hyperthyroidism   . Inguinal hernia   . Ischemic colitis ([place])   . Lower GI bleed   . Prolonged QT interval   . PULMONARY EMBOLISM 05/21/2010  . Umbilical hernia     Past Surgical History:  Procedure Laterality Date  . ACHILLES TENDON REPAIR    . COLONOSCOPY     last 2019, multiple  . DOPPLER ECHOCARDIOGRAPHY  12/30/2011   EF >55%; MILD LVH,MILD LAE,NORMAL DIASTOLIC FUNCTION  . RENAL DOPPLER  12/30/2011   NORMAL RENAL  . UMBILICAL HERNIA REPAIR N/A 01/04/2020   Procedure: UMBILICAL HERNIA REPAIR WITH MESH;  Surgeon: Jovita Kussmaul, MD;  Location: [facility];  Service: General;  Laterality: N/A;    Social History   Socioeconomic History  . Marital status: Married    Spouse name: Not on file  . Number of children: Not on file  . Years of education: Not on file  . Highest education level: Not on file  Occupational History  . Not on file  Tobacco Use  . Smoking status: Never Smoker  . Smokeless tobacco: Never Used  Vaping Use  . Vaping Use: Never used  Substance and Sexual Activity  . Alcohol use: Yes    Comment: 2-3 times/week  . Drug use: No  . Sexual activity: Not on file  Other Topics Concern  . Not on file  Social History Narrative   He is a married father of 2. [employer] employee   He does exercise about 3-4 days a week with no true symptoms.    Does not smoke and occasional social  alcohol.    Right handed   Social Determinants of Health   Financial Resource Strain: Not on file  Food Insecurity: Not on file  Transportation Needs: Not on file  Physical Activity: Not on file  Stress: Not on file  Social Connections: Not on file  Intimate Partner Violence: Not on file    Current Outpatient Medications on File Prior to Visit  Medication Sig Dispense Refill  . albuterol (PROVENTIL) (2.5 MG/3ML) 0.083% nebulizer solution Take 3 mLs (2.5 mg total) by nebulization every 6 (six) hours as needed for wheezing or shortness of breath. 75 mL 12  . albuterol (VENTOLIN HFA) 108 (90 Base) MCG/ACT inhaler Inhale 2 puffs into the lungs every 4 (four) hours as needed for wheezing. 18 g 2  . Calcium Carbonate-Vitamin D 600-400 MG-UNIT tablet Take 1 tablet by mouth daily.    . chlorthalidone (HYGROTON) 50 MG tablet TAKE 1 TABLET BY MOUTH EVERY DAY 90 tablet 0  . Cholecalciferol (VITAMIN D-3) 5000 units TABS Take 5,000 Units by mouth daily.    Marland Kitchen FLOVENT HFA 110 MCG/ACT inhaler INHALE 2 PUFFS BY MOUTH TWICE A DAY. RINSE MOUTH AFTER USE 36 each 0  . fluticasone (FLONASE) 50 MCG/ACT nasal spray PLACE 1 SPRAY  INTO BOTH NOSTRILS DAILY. 48 mL 1  . methimazole (TAPAZOLE) 5 MG tablet TAKE 1 TABLET BY MOUTH EVERY DAY 90 tablet 1  . Multiple Vitamin (MULTIVITAMIN) tablet Take 1 tablet by mouth daily.    . Omega-3 Fatty Acids (FISH OIL) 1000 MG CAPS Take by mouth 2 (two) times daily.    . primidone (MYSOLINE) 50 MG tablet Take 1 tablet (50 mg total) by mouth in the morning and at bedtime. 180 tablet 1  . ramipril (ALTACE) 10 MG capsule TAKE 1 CAPSULE BY MOUTH EVERY DAY 90 capsule 0  . rosuvastatin (CRESTOR) 5 MG tablet TAKE 1 TABLET BY MOUTH EVERY DAY 90 tablet 1  . SAVAYSA 60 MG TABS tablet TAKE 1 TABLET BY MOUTH EVERY DAY 30 tablet 5  . Spacer/Aero-Holding Chambers (E-Z SPACER) inhaler Use as instructed 1 each 2   No current facility-administered medications on file prior to visit.     Allergies  Allergen Reactions  . Celebrex [Celecoxib]     Blood in stool  . Other     Animal dander causes sneezing and watery eyes  . Neosporin [Bacitracin-Polymyxin B] Rash    Family History  Problem Relation Age of Onset  . Kidney failure Father        single kidney; hx of recurrent infections  . Renal cancer Father   . Pancreatic cancer Brother   . Hypertension Mother   . Hypothyroidism Mother   . Hypothyroidism Sister   . Heart disease Neg Hx   . Colon cancer Neg Hx   . Esophageal cancer Neg Hx   . Stomach cancer Neg Hx   . Rectal cancer Neg Hx     BP 140/76 (BP Location: Right Arm, Patient Position: Sitting, Cuff Size: Large)   Pulse 65   Ht 6\' 2"  (1.88 m)   Wt 224 lb 6.4 oz (101.8 kg)   SpO2 98%   BMI 28.81 kg/m    Review of Systems Denies fever    Objective:   Physical Exam VITAL SIGNS:  See vs page GENERAL: no distress NECK: There is no palpable thyroid enlargement.  No thyroid nodule is palpable.  No palpable lymphadenopathy at the anterior neck.  Lab Results  Component Value Date   TSH 2.31 05/07/2021        Assessment & Plan:  Hyperthyroidism: well-controlled.  Please continue the same methimazole

## 2021-05-09 MED ORDER — METHIMAZOLE 5 MG PO TABS: 5.0000 mg | ORAL_TABLET | Freq: Every day | ORAL | 3 refills | Status: DC

## 2021-05-13 NOTE — Progress Notes (Signed)
Assessment/Plan:    1.  Essential Tremor   -Continue primidone, 50 mg twice daily  2.  Hyperthyroidism  -Follows with Dr. Loanne Drilling.  Reportedly well controlled with methimazole.  3.  F/u yearly  Subjective:   Matthew Trujillo was seen today in follow up for essential tremor.  My previous records were reviewed prior to todays visit.  Patient started on primidone last visit and worked to 50 mg twice per day.  He weaned off of the topiramate.  Doing much better.  Tremor is well controlled.  Pt denies falls.  Pt denies lightheadedness, near syncope.  He does see Dr. Loanne Drilling for hyper thyroidism, but that is reported to be well controlled on methimazole.  Current prescribed movement disorder medications: Primidone, 50 mg twice per day (started last visit after his Eliquis was changed to savaysa)   PREVIOUS MEDICATIONS: Metoprolol (taken off in the past because of asthma); topiramate (cognitive change and depression)  ALLERGIES:   Allergies  Allergen Reactions  . Celebrex [Celecoxib]     Blood in stool  . Other     Animal dander causes sneezing and watery eyes  . Neosporin [Bacitracin-Polymyxin B] Rash    CURRENT MEDICATIONS:  Outpatient Encounter Medications as of 05/15/2021  Medication Sig  . albuterol (PROVENTIL) (2.5 MG/3ML) 0.083% nebulizer solution Take 3 mLs (2.5 mg total) by nebulization every 6 (six) hours as needed for wheezing or shortness of breath.  Marland Kitchen albuterol (VENTOLIN HFA) 108 (90 Base) MCG/ACT inhaler Inhale 2 puffs into the lungs every 4 (four) hours as needed for wheezing.  Marland Kitchen amLODipine (NORVASC) 10 MG tablet TAKE 1 TABLET BY MOUTH EVERY DAY  . Calcium Carbonate-Vitamin D 600-400 MG-UNIT tablet Take 1 tablet by mouth daily.  . chlorthalidone (HYGROTON) 50 MG tablet TAKE 1 TABLET BY MOUTH EVERY DAY  . Cholecalciferol (VITAMIN D-3) 5000 units TABS Take 5,000 Units by mouth daily.  Marland Kitchen FLOVENT HFA 110 MCG/ACT inhaler INHALE 2 PUFFS BY MOUTH TWICE A DAY. RINSE MOUTH  AFTER USE  . fluticasone (FLONASE) 50 MCG/ACT nasal spray PLACE 1 SPRAY INTO BOTH NOSTRILS DAILY.  . methimazole (TAPAZOLE) 5 MG tablet Take 1 tablet (5 mg total) by mouth daily.  . Multiple Vitamin (MULTIVITAMIN) tablet Take 1 tablet by mouth daily.  . Omega-3 Fatty Acids (FISH OIL) 1000 MG CAPS Take by mouth 2 (two) times daily.  . primidone (MYSOLINE) 50 MG tablet Take 1 tablet (50 mg total) by mouth in the morning and at bedtime.  . ramipril (ALTACE) 10 MG capsule TAKE 1 CAPSULE BY MOUTH EVERY DAY  . rosuvastatin (CRESTOR) 5 MG tablet TAKE 1 TABLET BY MOUTH EVERY DAY  . SAVAYSA 60 MG TABS tablet TAKE 1 TABLET BY MOUTH EVERY DAY  . Spacer/Aero-Holding Chambers (E-Z SPACER) inhaler Use as instructed   No facility-administered encounter medications on file as of 05/15/2021.     Objective:    PHYSICAL EXAMINATION:    VITALS:  There were no vitals filed for this visit.  GEN:  The patient appears stated age and is in NAD. HEENT:  Normocephalic, atraumatic.  The mucous membranes are moist. The superficial temporal arteries are without ropiness or tenderness. CV:  RRR Lungs:  CTAB Neck/HEME:  There are no carotid bruits bilaterally.  Neurological examination:  Orientation: The patient is alert and oriented x3. Cranial nerves: There is good facial symmetry. The speech is fluent and clear. Soft palate rises symmetrically and there is no tongue deviation. Hearing is intact to conversational tone.  Sensation: Sensation is intact to light touch throughout Motor: Strength is at least antigravity x4.  Movement examination: Tone: There is normal tone in the UE/LE Abnormal movements: no rest tremor.  Very mild tremor of outstretched hands.  Archimedes spirals much improved. Coordination:  There is no decremation with RAM's Gait and Station: The patient has no difficulty arising out of a deep-seated chair without the use of the hands. The patient's stride length is good I have reviewed and  interpreted the following labs independently   Chemistry      Component Value Date/Time   NA 139 11/24/2020 0932   K 4.0 11/24/2020 0932   CL 101 11/24/2020 0932   CO2 28 11/24/2020 0932   BUN 21 11/24/2020 0932   CREATININE 0.91 11/24/2020 0932      Component Value Date/Time   CALCIUM 10.0 11/24/2020 0932   ALKPHOS 52 04/29/2020 0753   AST 24 11/24/2020 0932   ALT 43 11/24/2020 0932   BILITOT 1.0 11/24/2020 0932      Lab Results  Component Value Date   WBC 9.2 11/24/2020   HGB 16.6 11/24/2020   HCT 49.2 11/24/2020   MCV 90.8 11/24/2020   PLT 329 11/24/2020   Lab Results  Component Value Date   TSH 2.31 05/07/2021     Chemistry      Component Value Date/Time   NA 139 11/24/2020 0932   K 4.0 11/24/2020 0932   CL 101 11/24/2020 0932   CO2 28 11/24/2020 0932   BUN 21 11/24/2020 0932   CREATININE 0.91 11/24/2020 0932      Component Value Date/Time   CALCIUM 10.0 11/24/2020 0932   ALKPHOS 52 04/29/2020 0753   AST 24 11/24/2020 0932   ALT 43 11/24/2020 0932   BILITOT 1.0 11/24/2020 0932        Cc:  Caren Macadam, MD

## 2021-05-15 ENCOUNTER — Ambulatory Visit: Payer: Medicare HMO | Admitting: Neurology

## 2021-05-15 ENCOUNTER — Encounter: Payer: Self-pay | Admitting: Neurology

## 2021-05-15 ENCOUNTER — Other Ambulatory Visit: Payer: Self-pay

## 2021-05-15 VITALS — BP 120/64 | HR 67 | Ht 74.0 in | Wt 225.0 lb

## 2021-05-15 DIAGNOSIS — G250 Essential tremor: Secondary | ICD-10-CM

## 2021-05-15 NOTE — Patient Instructions (Signed)

## 2021-05-19 ENCOUNTER — Telehealth: Payer: Self-pay

## 2021-05-19 ENCOUNTER — Ambulatory Visit: Payer: Medicare HMO

## 2021-05-19 NOTE — Telephone Encounter (Cosign Needed)
Waited on line for [place] virtual visit x 15 minute pt never logged on.  Attempted to call patient x 3 while waiting no answer.   Pt need to reschedule next available appointment.

## 2021-05-19 NOTE — Progress Notes (Deleted)
Subjective:   Matthew Trujillo is a 70 y.o. male who presents for an Initial Medicare Annual Wellness Visit.  Virtual Visit via Video Note  I connected with  by a video enabled telemedicine application and verified that I am speaking with the correct person using two identifiers.  Location: Patient: Home Provider: Office Persons participating in the virtual visit: patient, provider   I discussed the limitations of evaluation and management by telemedicine and the availability of in person appointments. The patient expressed understanding and agreed to proceed.     Mickel Baas Adrine Hayworth,LPN   Review of Systems    n/a       Objective:    There were no vitals filed for this visit. There is no height or weight on file to calculate BMI.  Advanced Directives 02/18/2021 12/08/2020 01/04/2020 12/27/2019 08/08/2019 10/28/2017 10/07/2017  Does Patient Have a Medical Advance Directive? Yes Yes Yes Yes No Yes Yes  Type of Paramedic of [place];Living will - [place];Living will Living will;Healthcare Power of [place];Living will [place];Living will  Does patient want to make changes to medical advance directive? - - No - Patient declined No - Patient declined - - -  Copy of [place] in Chart? - - No - copy requested No - copy requested - - No - copy requested  Would patient like information on creating a medical advance directive? - - - - No - Patient declined - -    Current Medications (verified) Outpatient Encounter Medications as of 05/19/2021  Medication Sig  . albuterol (PROVENTIL) (2.5 MG/3ML) 0.083% nebulizer solution Take 3 mLs (2.5 mg total) by nebulization every 6 (six) hours as needed for wheezing or shortness of breath.  Marland Kitchen albuterol (VENTOLIN HFA) 108 (90 Base) MCG/ACT inhaler Inhale 2 puffs into the lungs every 4 (four) hours as needed for wheezing.  Marland Kitchen amLODipine  (NORVASC) 10 MG tablet TAKE 1 TABLET BY MOUTH EVERY DAY  . Calcium Carbonate-Vitamin D 600-400 MG-UNIT tablet Take 1 tablet by mouth daily.  . chlorthalidone (HYGROTON) 50 MG tablet TAKE 1 TABLET BY MOUTH EVERY DAY  . Cholecalciferol (VITAMIN D-3) 5000 units TABS Take 5,000 Units by mouth daily.  Marland Kitchen FLOVENT HFA 110 MCG/ACT inhaler INHALE 2 PUFFS BY MOUTH TWICE A DAY. RINSE MOUTH AFTER USE  . fluticasone (FLONASE) 50 MCG/ACT nasal spray PLACE 1 SPRAY INTO BOTH NOSTRILS DAILY.  . methimazole (TAPAZOLE) 5 MG tablet Take 1 tablet (5 mg total) by mouth daily.  . Multiple Vitamin (MULTIVITAMIN) tablet Take 1 tablet by mouth daily.  . Omega-3 Fatty Acids (FISH OIL) 1000 MG CAPS Take by mouth 2 (two) times daily.  . primidone (MYSOLINE) 50 MG tablet Take 1 tablet (50 mg total) by mouth in the morning and at bedtime.  . ramipril (ALTACE) 10 MG capsule TAKE 1 CAPSULE BY MOUTH EVERY DAY  . rosuvastatin (CRESTOR) 5 MG tablet TAKE 1 TABLET BY MOUTH EVERY DAY  . SAVAYSA 60 MG TABS tablet TAKE 1 TABLET BY MOUTH EVERY DAY  . Spacer/Aero-Holding Chambers (E-Z SPACER) inhaler Use as instructed   No facility-administered encounter medications on file as of 05/19/2021.    Allergies (verified) Celebrex [celecoxib], Other, and Neosporin [bacitracin-polymyxin b]   History: Past Medical History:  Diagnosis Date  . ALLERGIC RHINITIS 05/21/2010  . ASTHMA 05/21/2010  . Asthma   . Factor V Leiden ([place])   . Hx of adenomatous colonic polyps   .  HYPERTENSION 05/21/2010   Diffiult to control  . Hyperthyroidism   . Inguinal hernia   . Ischemic colitis ([place])   . Lower GI bleed   . Prolonged QT interval   . PULMONARY EMBOLISM 05/21/2010  . Umbilical hernia    Past Surgical History:  Procedure Laterality Date  . ACHILLES TENDON REPAIR    . COLONOSCOPY     last 2019, multiple  . DOPPLER ECHOCARDIOGRAPHY  12/30/2011   EF >55%; MILD LVH,MILD LAE,NORMAL DIASTOLIC FUNCTION  . RENAL DOPPLER  12/30/2011   NORMAL RENAL   . UMBILICAL HERNIA REPAIR N/A 01/04/2020   Procedure: UMBILICAL HERNIA REPAIR WITH MESH;  Surgeon: Jovita Kussmaul, MD;  Location: [facility];  Service: General;  Laterality: N/A;   Family History  Problem Relation Age of Onset  . Kidney failure Father        single kidney; hx of recurrent infections  . Renal cancer Father   . Pancreatic cancer Brother   . Hypertension Mother   . Hypothyroidism Mother   . Hypothyroidism Sister   . Heart disease Neg Hx   . Colon cancer Neg Hx   . Esophageal cancer Neg Hx   . Stomach cancer Neg Hx   . Rectal cancer Neg Hx    Social History   Socioeconomic History  . Marital status: Married    Spouse name: Not on file  . Number of children: Not on file  . Years of education: Not on file  . Highest education level: Not on file  Occupational History  . Not on file  Tobacco Use  . Smoking status: Never Smoker  . Smokeless tobacco: Never Used  Vaping Use  . Vaping Use: Never used  Substance and Sexual Activity  . Alcohol use: Yes    Comment: 2-3 times/week  . Drug use: No  . Sexual activity: Not on file  Other Topics Concern  . Not on file  Social History Narrative   He is a married father of 2. [employer] employee   He does exercise about 3-4 days a week with no true symptoms.    Does not smoke and occasional social alcohol.    Right handed   Social Determinants of Health   Financial Resource Strain: Not on file  Food Insecurity: Not on file  Transportation Needs: Not on file  Physical Activity: Not on file  Stress: Not on file  Social Connections: Not on file    Tobacco Counseling Counseling given: Not Answered   Clinical Intake:                 Diabetic?***         Activities of Daily Living No flowsheet data found.  Patient Care Team: Caren Macadam, MD as PCP - General (Family Medicine)  Indicate any recent Medical Services you may have received from other than [facility] providers in the  past year (date may be approximate).     Assessment:   This is a routine wellness examination for [facility].  Hearing/Vision screen No exam data present  Dietary issues and exercise activities discussed:    Goals Addressed   None    Depression Screen PHQ 2/9 Scores 11/24/2020 08/22/2019 04/25/2018 10/30/2016  PHQ - 2 Score 0 0 0 0    Fall Risk Fall Risk  02/18/2021 12/08/2020 11/24/2020 04/25/2018 10/30/2016  Falls in the past year? 1 0 0 No No  Number falls in past yr: 0 0 0 - -  Injury with  Fall? 0 0 - - -    FALL RISK PREVENTION PERTAINING TO THE HOME:  Any stairs in or around the home? {YES/NO:21197} If so, are there any without handrails? {YES/NO:21197} Home free of loose throw rugs in walkways, pet beds, electrical cords, etc? {YES/NO:21197} Adequate lighting in your home to reduce risk of falls? {YES/NO:21197}  ASSISTIVE DEVICES UTILIZED TO PREVENT FALLS:  Life alert? {YES/NO:21197} Use of a cane, walker or w/c? {YES/NO:21197} Grab bars in the bathroom? {YES/NO:21197} Shower chair or bench in shower? {YES/NO:21197} Elevated toilet seat or a handicapped toilet? {YES/NO:21197}  TIMED UP AND GO:  Was the test performed? {YES/NO:21197}.  Length of time to ambulate 10 feet: *** sec.   {Appearance of DTOI:7124580}  Cognitive Function:        Immunizations Immunization History  Administered Date(s) Administered  . Fluad Quad(high Dose 65+) 08/22/2019  . Influenza Whole 09/29/2010  . Influenza, High Dose Seasonal PF 10/05/2018  . Influenza-Unspecified 10/12/2017, 08/27/2020  . PFIZER(Purple Top)SARS-COV-2 Vaccination 01/28/2020, 02/17/2020, 09/26/2020  . Pneumococcal Conjugate-13 08/16/2018  . Pneumococcal Polysaccharide-23 08/22/2019  . Tdap 09/15/2019  . Zoster Recombinat (Shingrix) 08/24/2018, 03/12/2019    {TDAP status:2101805}  {Flu Vaccine status:2101806}  {Pneumococcal vaccine status:2101807}  {Covid-19 vaccine status:2101808}  Qualifies for  Shingles Vaccine? {YES/NO:21197}  Zostavax completed {YES/NO:21197}  {Shingrix Completed?:2101804}  Screening Tests Health Maintenance  Topic Date Due  . COVID-19 Vaccine (4 - Booster for [place] series) 12/27/2020  . INFLUENZA VACCINE  07/27/2021  . COLONOSCOPY (Pts 45-26yrs Insurance coverage will need to be confirmed)  11/21/2023  . TETANUS/TDAP  09/14/2029  . Hepatitis C Screening  Completed  . PNA vac Low Risk Adult  Completed  . HPV VACCINES  Aged Out    Health Maintenance  Health Maintenance Due  Topic Date Due  . COVID-19 Vaccine (4 - Booster for Pfizer series) 12/27/2020    {Colorectal cancer screening:2101809}  Lung Cancer Screening: (Low Dose CT Chest recommended if Age 22-80 years, 30 pack-year currently smoking OR have quit w/in 15years.) {DOES NOT does:27190::"does not"} qualify.   Lung Cancer Screening Referral: ***  Additional Screening:  Hepatitis C Screening: {DOES NOT does:27190::"does not"} qualify; Completed ***  Vision Screening: Recommended annual ophthalmology exams for early detection of glaucoma and other disorders of the eye. Is the patient up to date with their annual eye exam?  {YES/NO:21197} Who is the provider or what is the name of the office in which the patient attends annual eye exams? *** If pt is not established with a provider, would they like to be referred to a provider to establish care? {YES/NO:21197}.   Dental Screening: Recommended annual dental exams for proper oral hygiene  Community Resource Referral / Chronic Care Management: CRR required this visit?  {YES/NO:21197}  CCM required this visit?  {YES/NO:21197}     Plan:     I have personally reviewed and noted the following in the patient's chart:   . Medical and social history . Use of alcohol, tobacco or illicit drugs  . Current medications and supplements including opioid prescriptions. {Opioid Prescriptions:8126300892} . Functional ability and status . Nutritional  status . Physical activity . Advanced directives . List of other physicians . Hospitalizations, surgeries, and ER visits in previous 12 months . Vitals . Screenings to include cognitive, depression, and falls . Referrals and appointments  In addition, I have reviewed and discussed with patient certain preventive protocols, quality metrics, and best practice recommendations. A written personalized care plan for preventive services as well as general  preventive health recommendations were provided to patient.     Randel Pigg, LPN   [id number]   Nurse Notes: ***

## 2021-05-26 ENCOUNTER — Other Ambulatory Visit: Payer: Self-pay | Admitting: Family Medicine

## 2021-05-27 ENCOUNTER — Other Ambulatory Visit: Payer: Self-pay | Admitting: Family Medicine

## 2021-05-27 DIAGNOSIS — J4521 Mild intermittent asthma with (acute) exacerbation: Secondary | ICD-10-CM

## 2021-06-10 ENCOUNTER — Other Ambulatory Visit: Payer: Self-pay | Admitting: Neurology

## 2021-06-10 ENCOUNTER — Other Ambulatory Visit: Payer: Self-pay | Admitting: Cardiology

## 2021-06-10 ENCOUNTER — Other Ambulatory Visit: Payer: Self-pay | Admitting: Family Medicine

## 2021-06-15 ENCOUNTER — Telehealth: Payer: Self-pay | Admitting: Family Medicine

## 2021-06-15 NOTE — Telephone Encounter (Signed)
Left message for patient to call back and schedule Medicare Annual Wellness Visit (AWV) either virtually or in office.   AWV-I PER [place] 06/27/19 please schedule at anytime with [facility] Nurse Health Advisor 1 or 2   This should be a 45 minute visit.

## 2021-07-10 ENCOUNTER — Ambulatory Visit (INDEPENDENT_AMBULATORY_CARE_PROVIDER_SITE_OTHER): Payer: Medicare HMO

## 2021-07-10 ENCOUNTER — Other Ambulatory Visit: Payer: Self-pay

## 2021-07-10 VITALS — BP 140/80 | HR 68 | Temp 98.0°F | Ht 74.0 in | Wt 219.0 lb

## 2021-07-10 DIAGNOSIS — Z0100 Encounter for examination of eyes and vision without abnormal findings: Secondary | ICD-10-CM

## 2021-07-10 DIAGNOSIS — Z0000 Encounter for general adult medical examination without abnormal findings: Secondary | ICD-10-CM | POA: Diagnosis not present

## 2021-07-10 NOTE — Patient Instructions (Signed)
Matthew Trujillo , Thank you for taking time to come for your Medicare Wellness Visit. I appreciate your ongoing commitment to your health goals. Please review the following plan we discussed and let me know if I can assist you in the future.   Screening recommendations/referrals: Colonoscopy: 11/20/2018  due 2024 Recommended yearly ophthalmology/optometry visit for glaucoma screening and checkup Recommended yearly dental visit for hygiene and checkup  Vaccinations: Influenza vaccine: due in fall 2022 Pneumococcal vaccine: completed series  Tdap vaccine: 09/15/2019 Shingles vaccine: completed series     Advanced directives: will provide copies   Conditions/risks identified: none   Next appointment: none   Preventive Care 68 Years and Older, Male Preventive care refers to lifestyle choices and visits with your health care provider that can promote health and wellness. What does preventive care include? A yearly physical exam. This is also called an annual well check. Dental exams once or twice a year. Routine eye exams. Ask your health care provider how often you should have your eyes checked. Personal lifestyle choices, including: Daily care of your teeth and gums. Regular physical activity. Eating a healthy diet. Avoiding tobacco and drug use. Limiting alcohol use. Practicing safe sex. Taking low doses of aspirin every day. Taking vitamin and mineral supplements as recommended by your health care provider. What happens during an annual well check? The services and screenings done by your health care provider during your annual well check will depend on your age, overall health, lifestyle risk factors, and family history of disease. Counseling  Your health care provider may ask you questions about your: Alcohol use. Tobacco use. Drug use. Emotional well-being. Home and relationship well-being. Sexual activity. Eating habits. History of falls. Memory and ability to understand  (cognition). Work and work Statistician. Screening  You may have the following tests or measurements: Height, weight, and BMI. Blood pressure. Lipid and cholesterol levels. These may be checked every 5 years, or more frequently if you are over 2 years old. Skin check. Lung cancer screening. You may have this screening every year starting at age 69 if you have a 30-pack-year history of smoking and currently smoke or have quit within the past 15 years. Fecal occult blood test (FOBT) of the stool. You may have this test every year starting at age 55. Flexible sigmoidoscopy or colonoscopy. You may have a sigmoidoscopy every 5 years or a colonoscopy every 10 years starting at age 40. Prostate cancer screening. Recommendations will vary depending on your family history and other risks. Hepatitis C blood test. Hepatitis B blood test. Sexually transmitted disease (STD) testing. Diabetes screening. This is done by checking your blood sugar (glucose) after you have not eaten for a while (fasting). You may have this done every 1-3 years. Abdominal aortic aneurysm (AAA) screening. You may need this if you are a current or former smoker. Osteoporosis. You may be screened starting at age 80 if you are at high risk. Talk with your health care provider about your test results, treatment options, and if necessary, the need for more tests. Vaccines  Your health care provider may recommend certain vaccines, such as: Influenza vaccine. This is recommended every year. Tetanus, diphtheria, and acellular pertussis (Tdap, Td) vaccine. You may need a Td booster every 10 years. Zoster vaccine. You may need this after age 8. Pneumococcal 13-valent conjugate (PCV13) vaccine. One dose is recommended after age 45. Pneumococcal polysaccharide (PPSV23) vaccine. One dose is recommended after age 77. Talk to your health care provider about which screenings  and vaccines you need and how often you need them. This  information is not intended to replace advice given to you by your health care provider. Make sure you discuss any questions you have with your health care provider. Document Released: 01/09/2016 Document Revised: 09/01/2016 Document Reviewed: 10/14/2015 Elsevier Interactive Patient Education  2017 [place] Prevention in the Home Falls can cause injuries. They can happen to people of all ages. There are many things you can do to make your home safe and to help prevent falls. What can I do on the outside of my home? Regularly fix the edges of walkways and driveways and fix any cracks. Remove anything that might make you trip as you walk through a door, such as a raised step or threshold. Trim any bushes or trees on the path to your home. Use bright outdoor lighting. Clear any walking paths of anything that might make someone trip, such as rocks or tools. Regularly check to see if handrails are loose or broken. Make sure that both sides of any steps have handrails. Any raised decks and porches should have guardrails on the edges. Have any leaves, snow, or ice cleared regularly. Use sand or salt on walking paths during winter. Clean up any spills in your garage right away. This includes oil or grease spills. What can I do in the bathroom? Use night lights. Install grab bars by the toilet and in the tub and shower. Do not use towel bars as grab bars. Use non-skid mats or decals in the tub or shower. If you need to sit down in the shower, use a plastic, non-slip stool. Keep the floor dry. Clean up any water that spills on the floor as soon as it happens. Remove soap buildup in the tub or shower regularly. Attach bath mats securely with double-sided non-slip rug tape. Do not have throw rugs and other things on the floor that can make you trip. What can I do in the bedroom? Use night lights. Make sure that you have a light by your bed that is easy to reach. Do not use any sheets or  blankets that are too big for your bed. They should not hang down onto the floor. Have a firm chair that has side arms. You can use this for support while you get dressed. Do not have throw rugs and other things on the floor that can make you trip. What can I do in the kitchen? Clean up any spills right away. Avoid walking on wet floors. Keep items that you use a lot in easy-to-reach places. If you need to reach something above you, use a strong step stool that has a grab bar. Keep electrical cords out of the way. Do not use floor polish or wax that makes floors slippery. If you must use wax, use non-skid floor wax. Do not have throw rugs and other things on the floor that can make you trip. What can I do with my stairs? Do not leave any items on the stairs. Make sure that there are handrails on both sides of the stairs and use them. Fix handrails that are broken or loose. Make sure that handrails are as long as the stairways. Check any carpeting to make sure that it is firmly attached to the stairs. Fix any carpet that is loose or worn. Avoid having throw rugs at the top or bottom of the stairs. If you do have throw rugs, attach them to the floor with carpet tape.  Make sure that you have a light switch at the top of the stairs and the bottom of the stairs. If you do not have them, ask someone to add them for you. What else can I do to help prevent falls? Wear shoes that: Do not have high heels. Have rubber bottoms. Are comfortable and fit you well. Are closed at the toe. Do not wear sandals. If you use a stepladder: Make sure that it is fully opened. Do not climb a closed stepladder. Make sure that both sides of the stepladder are locked into place. Ask someone to hold it for you, if possible. Clearly mark and make sure that you can see: Any grab bars or handrails. First and last steps. Where the edge of each step is. Use tools that help you move around (mobility aids) if they are  needed. These include: Canes. Walkers. Scooters. Crutches. Turn on the lights when you go into a dark area. Replace any light bulbs as soon as they burn out. Set up your furniture so you have a clear path. Avoid moving your furniture around. If any of your floors are uneven, fix them. If there are any pets around you, be aware of where they are. Review your medicines with your doctor. Some medicines can make you feel dizzy. This can increase your chance of falling. Ask your doctor what other things that you can do to help prevent falls. This information is not intended to replace advice given to you by your health care provider. Make sure you discuss any questions you have with your health care provider. Document Released: 10/09/2009 Document Revised: 05/20/2016 Document Reviewed: 01/17/2015 Elsevier Interactive Patient Education  2017 [employer].

## 2021-07-10 NOTE — Progress Notes (Signed)
Subjective:   Matthew Trujillo is a 70 y.o. male who presents for an Initial Medicare Annual Wellness Visit.  Review of Systems    N/a       Objective:    There were no vitals filed for this visit. There is no height or weight on file to calculate BMI.  Advanced Directives 02/18/2021 12/08/2020 01/04/2020 12/27/2019 08/08/2019 10/28/2017 10/07/2017  Does Patient Have a Medical Advance Directive? Yes Yes Yes Yes No Yes Yes  Type of Paramedic of [place];Living will - [place];Living will Living will;Healthcare Power of [place];Living will [place];Living will  Does patient want to make changes to medical advance directive? - - No - Patient declined No - Patient declined - - -  Copy of [place] in Chart? - - No - copy requested No - copy requested - - No - copy requested  Would patient like information on creating a medical advance directive? - - - - No - Patient declined - -    Current Medications (verified) Outpatient Encounter Medications as of 07/10/2021  Medication Sig   albuterol (PROVENTIL) (2.5 MG/3ML) 0.083% nebulizer solution Take 3 mLs (2.5 mg total) by nebulization every 6 (six) hours as needed for wheezing or shortness of breath.   albuterol (VENTOLIN HFA) 108 (90 Base) MCG/ACT inhaler INHALE 2 PUFFS INTO THE LUNGS EVERY 4 HOURS AS NEEDED FOR WHEEZE   amLODipine (NORVASC) 10 MG tablet TAKE 1 TABLET BY MOUTH EVERY DAY   Calcium Carbonate-Vitamin D 600-400 MG-UNIT tablet Take 1 tablet by mouth daily.   chlorthalidone (HYGROTON) 50 MG tablet TAKE 1 TABLET BY MOUTH EVERY DAY   Cholecalciferol (VITAMIN D-3) 5000 units TABS Take 5,000 Units by mouth daily.   FLOVENT HFA 110 MCG/ACT inhaler INHALE 2 PUFFS BY MOUTH TWICE A DAY. RINSE MOUTH AFTER USE   fluticasone (FLONASE) 50 MCG/ACT nasal spray PLACE 1 SPRAY INTO BOTH NOSTRILS DAILY.   methimazole (TAPAZOLE) 5 MG  tablet Take 1 tablet (5 mg total) by mouth daily.   Multiple Vitamin (MULTIVITAMIN) tablet Take 1 tablet by mouth daily.   Omega-3 Fatty Acids (FISH OIL) 1000 MG CAPS Take by mouth 2 (two) times daily.   primidone (MYSOLINE) 50 MG tablet TAKE 1 TABLET (50 MG TOTAL) BY MOUTH IN THE MORNING AND AT BEDTIME.   ramipril (ALTACE) 10 MG capsule TAKE 1 CAPSULE BY MOUTH EVERY DAY   rosuvastatin (CRESTOR) 5 MG tablet TAKE 1 TABLET BY MOUTH EVERY DAY   SAVAYSA 60 MG TABS tablet TAKE 1 TABLET BY MOUTH EVERY DAY   Spacer/Aero-Holding Chambers (E-Z SPACER) inhaler Use as instructed   No facility-administered encounter medications on file as of 07/10/2021.    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[place])    Lower GI bleed    Prolonged QT interval    PULMONARY EMBOLISM 3/50/0938   Umbilical hernia    Past Surgical History:  Procedure Laterality Date   ACHILLES TENDON REPAIR     COLONOSCOPY     last 2019, multiple   DOPPLER ECHOCARDIOGRAPHY  12/30/2011  EF >55%; MILD LVH,MILD LAE,NORMAL DIASTOLIC FUNCTION   RENAL DOPPLER  12/30/2011   NORMAL RENAL   UMBILICAL HERNIA REPAIR N/A 01/04/2020   Procedure: UMBILICAL HERNIA REPAIR WITH MESH;  Surgeon: Jovita Kussmaul, MD;  Location: [facility];  Service: General;  Laterality: N/A;   Family History  Problem Relation Age of Onset   Kidney failure Father        single kidney; hx of recurrent infections   Renal cancer Father    Pancreatic cancer Brother    Hypertension Mother    Hypothyroidism Mother    Hypothyroidism Sister    Heart disease Neg Hx    Colon cancer Neg Hx    Esophageal cancer Neg Hx    Stomach  cancer Neg Hx    Rectal cancer Neg Hx    Social History   Socioeconomic History   Marital status: Married    Spouse name: Not on file   Number of children: Not on file   Years of education: Not on file   Highest education level: Not on file  Occupational History   Not on file  Tobacco Use   Smoking status: Never   Smokeless tobacco: Never  Vaping Use   Vaping Use: Never used  Substance and Sexual Activity   Alcohol use: Yes    Comment: 2-3 times/week   Drug use: No   Sexual activity: Not on file  Other Topics Concern   Not on file  Social History Narrative   He is a married father of 2. [employer] employee   He does exercise about 3-4 days a week with no true symptoms.    Does not smoke and occasional social alcohol.    Right handed   Social Determinants of Health   Financial Resource Strain: Not on file  Food Insecurity: Not on file  Transportation Needs: Not on file  Physical Activity: Not on file  Stress: Not on file  Social Connections: Not on file    Tobacco Counseling Counseling given: Not Answered   Clinical Intake:                 Diabetic?no         Activities of Daily Living No flowsheet data found.  Patient Care Team: Caren Macadam, MD as PCP - General (Family Medicine)  Indicate any recent Medical Services you may have received from other than [facility] providers in the past year (date may be approximate).     Assessment:   This is a routine wellness examination for [facility].  Hearing/Vision screen No results found.  Dietary issues and exercise activities discussed:     Goals Addressed   None    Depression Screen PHQ 2/9 Scores 11/24/2020 08/22/2019 04/25/2018 10/30/2016  PHQ - 2 Score 0 0 0 0    Fall Risk Fall Risk  02/18/2021 12/08/2020 11/24/2020 04/25/2018 10/30/2016  Falls in the past year? 1 0 0 No No  Number falls in past yr: 0 0 0 - -  Injury with Fall? 0 0 - - -    FALL RISK PREVENTION PERTAINING TO THE  HOME:  Any stairs in or around the home? Yes  If so, are there any without handrails? No  Home free of loose throw rugs in walkways, pet beds, electrical cords, etc? Yes  Adequate lighting in your home to reduce risk of falls? Yes   ASSISTIVE DEVICES UTILIZED TO PREVENT FALLS:  Life alert? No  Use of a cane, walker or  w/c? No  Grab bars in the bathroom? Yes  Shower chair or bench in shower? Yes  Elevated toilet seat or a handicapped toilet? No   TIMED UP AND GO:  Was the test performed? Yes .  Length of time to ambulate 10 feet: 5 sec.   Gait steady and fast without use of assistive device  Cognitive Function:        Immunizations Immunization History  Administered Date(s) Administered   Fluad Quad(high Dose 65+) 08/22/2019   Influenza Whole 09/29/2010   Influenza, High Dose Seasonal PF 10/05/2018   Influenza-Unspecified 10/12/2017, 08/27/2020   PFIZER(Purple Top)SARS-COV-2 Vaccination 01/28/2020, 02/17/2020, 09/26/2020   Pneumococcal Conjugate-13 08/16/2018   Pneumococcal Polysaccharide-23 08/22/2019   Tdap 09/15/2019   Zoster Recombinat (Shingrix) 08/24/2018, 03/12/2019    TDAP status: Up to date  Flu Vaccine status: Up to date  Pneumococcal vaccine status: Up to date  Covid-19 vaccine status: Completed vaccines  Qualifies for Shingles Vaccine? Yes   Zostavax completed No   Shingrix Completed?: Yes  Screening Tests Health Maintenance  Topic Date Due   COVID-19 Vaccine (4 - Booster for Pfizer series) 12/27/2020   INFLUENZA VACCINE  07/27/2021   COLONOSCOPY (Pts 45-67yrs Insurance coverage will need to be confirmed)  11/21/2023   TETANUS/TDAP  09/14/2029   Hepatitis C Screening  Completed   PNA vac Low Risk Adult  Completed   Zoster Vaccines- Shingrix  Completed   HPV VACCINES  Aged Out    Health Maintenance  Health Maintenance Due  Topic Date Due   COVID-19 Vaccine (4 - Booster for [place] series) 12/27/2020    Colorectal cancer screening: Type  of screening: Colonoscopy. Completed 11/20/2018. Repeat every 5 years  Lung Cancer Screening: (Low Dose CT Chest recommended if Age 45-80 years, 30 pack-year currently smoking OR have quit w/in 15years.) does not qualify.   Lung Cancer Screening Referral: n/a  Additional Screening:  Hepatitis C Screening: does not qualify; Completed 03/29/2018  Vision Screening: Recommended annual ophthalmology exams for early detection of glaucoma and other disorders of the eye. Is the patient up to date with their annual eye exam?  No  Who is the provider or what is the name of the office in which the patient attends annual eye exams? Referral 07/10/2021 completed  If pt is not established with a provider, would they like to be referred to a provider to establish care? Yes .   Dental Screening: Recommended annual dental exams for proper oral hygiene  Community Resource Referral / Chronic Care Management: CRR required this visit?  No   CCM required this visit?  No      Plan:     I have personally reviewed and noted the following in the patient's chart:   Medical and social history Use of alcohol, tobacco or illicit drugs  Current medications and supplements including opioid prescriptions. Patient is not currently taking opioid prescriptions. Functional ability and status Nutritional status Physical activity Advanced directives List of other physicians Hospitalizations, surgeries, and ER visits in previous 12 months Vitals Screenings to include cognitive, depression, and falls Referrals and appointments  In addition, I have reviewed and discussed with patient certain preventive protocols, quality metrics, and best practice recommendations. A written personalized care plan for preventive services as well as general preventive health recommendations were provided to patient.     Randel Pigg, LPN   1/60/1093   Nurse Notes: none

## 2021-08-01 ENCOUNTER — Other Ambulatory Visit: Payer: Self-pay | Admitting: Cardiology

## 2021-08-10 ENCOUNTER — Other Ambulatory Visit: Payer: Self-pay | Admitting: Family Medicine

## 2021-09-03 ENCOUNTER — Other Ambulatory Visit: Payer: Self-pay | Admitting: Family Medicine

## 2021-09-03 DIAGNOSIS — J4521 Mild intermittent asthma with (acute) exacerbation: Secondary | ICD-10-CM

## 2021-09-22 ENCOUNTER — Encounter: Payer: Self-pay | Admitting: Family Medicine

## 2021-09-22 ENCOUNTER — Telehealth (INDEPENDENT_AMBULATORY_CARE_PROVIDER_SITE_OTHER): Payer: Medicare HMO | Admitting: Family Medicine

## 2021-09-22 VITALS — Temp 98.5°F

## 2021-09-22 DIAGNOSIS — U071 COVID-19: Secondary | ICD-10-CM

## 2021-09-22 MED ORDER — BENZONATATE 200 MG PO CAPS: 200.0000 mg | ORAL_CAPSULE | Freq: Two times a day (BID) | ORAL | 0 refills | Status: DC | PRN

## 2021-09-22 MED ORDER — MOLNUPIRAVIR EUA 200MG CAPSULE: 4.0000 | ORAL_CAPSULE | Freq: Two times a day (BID) | ORAL | 0 refills | Status: AC

## 2021-09-22 NOTE — Progress Notes (Signed)
Virtual Visit via Video Note  I connected with Lee  on 09/22/21 at  4:00 PM EDT by a video enabled telemedicine application and verified that I am speaking with the correct person using two identifiers.  Location patient: home, [place] Location provider:work or home office Persons participating in the virtual visit: patient, provider  I discussed the limitations of evaluation and management by telemedicine and the availability of in person appointments. The patient expressed understanding and agreed to proceed.   HPI:  Acute telemedicine visit for Covid19: -Onset: yesterday, positive covid test today -Symptoms include:nasal congestion, low grade temp around 100, cough, HA, body aches, feels tired, had some wheezing and nebulizer helped that -Denies:CP, SOB, NVD, inability to eat/drink/get out of bed -Pertinent past medical history: see below -Pertinent medication allergies:  Allergies  Allergen Reactions   Celebrex [Celecoxib]     Blood in stool   Other     Animal dander causes sneezing and watery eyes   Neosporin [Bacitracin-Polymyxin B] Rash  -COVID-19 vaccine status:vaccinated x2 and had 2 booster  ROS: See pertinent positives and negatives per HPI.  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[place])    Lower GI bleed    Prolonged QT interval    PULMONARY EMBOLISM 5/36/6440   Umbilical hernia     Past Surgical History:  Procedure Laterality Date   ACHILLES TENDON REPAIR     COLONOSCOPY     last 2019, multiple   DOPPLER ECHOCARDIOGRAPHY  12/30/2011   EF >55%; MILD LVH,MILD LAE,NORMAL DIASTOLIC FUNCTION   RENAL DOPPLER  12/30/2011   NORMAL RENAL   UMBILICAL HERNIA REPAIR N/A 01/04/2020   Procedure: UMBILICAL HERNIA REPAIR WITH MESH;  Surgeon: Jovita Kussmaul, MD;  Location: [facility];  Service: General;  Laterality: N/A;     Current Outpatient Medications:    albuterol (PROVENTIL) (2.5 MG/3ML) 0.083% nebulizer solution, Take 3 mLs (2.5 mg total) by nebulization every 6 (six) hours as needed for wheezing or shortness of breath., Disp: 75 mL, Rfl: 12   albuterol (VENTOLIN HFA) 108 (90 Base) MCG/ACT inhaler, INHALE 2 PUFFS INTO THE LUNGS EVERY 4 HOURS AS NEEDED FOR WHEEZE, Disp: 18 each, Rfl: 2   amLODipine (NORVASC) 10 MG tablet, TAKE 1 TABLET BY MOUTH EVERY DAY, Disp: 90 tablet, Rfl: 1   benzonatate (TESSALON) 200 MG capsule, Take 1 capsule (200 mg total) by mouth 2 (two) times daily as needed for cough., Disp: 20 capsule, Rfl: 0   Calcium Carbonate-Vitamin D 600-400 MG-UNIT tablet, Take 1 tablet by mouth daily., Disp: , Rfl:    chlorthalidone (HYGROTON) 50 MG tablet, TAKE 1 TABLET BY MOUTH EVERY DAY, Disp: 90 tablet, Rfl: 1   Cholecalciferol (VITAMIN D-3) 5000 units TABS, Take 5,000 Units by mouth daily., Disp: , Rfl:    FLOVENT HFA 110 MCG/ACT inhaler, INHALE 2 PUFFS BY MOUTH TWICE A DAY. RINSE MOUTH AFTER USE, Disp: 36 each, Rfl: 0   fluticasone (FLONASE) 50 MCG/ACT nasal spray, PLACE 1 SPRAY INTO BOTH NOSTRILS DAILY., Disp: 48 mL, Rfl: 1   methimazole (TAPAZOLE) 5 MG tablet, Take 1 tablet (5 mg total) by mouth daily., Disp: 90 tablet, Rfl: 3   molnupiravir EUA (LAGEVRIO) 200 mg  CAPS capsule, Take 4 capsules (800 mg total) by mouth 2 (two) times daily for 5 days., Disp: 40 capsule, Rfl: 0   Multiple Vitamin (MULTIVITAMIN) tablet, Take 1 tablet by mouth daily., Disp: , Rfl:    Omega-3 Fatty Acids (FISH OIL) 1000 MG CAPS, Take by mouth 2 (two) times daily., Disp: , Rfl:    primidone (MYSOLINE) 50 MG tablet, TAKE 1 TABLET (50 MG TOTAL) BY MOUTH IN THE MORNING AND AT BEDTIME., Disp: 180 tablet, Rfl: 0   ramipril (ALTACE) 10 MG capsule, TAKE 1 CAPSULE BY MOUTH EVERY DAY, Disp: 90 capsule, Rfl: 0   rosuvastatin (CRESTOR) 5 MG tablet,  TAKE 1 TABLET BY MOUTH EVERY DAY, Disp: 90 tablet, Rfl: 1   SAVAYSA 60 MG TABS tablet, TAKE 1 TABLET BY MOUTH EVERY DAY, Disp: 30 tablet, Rfl: 5   Spacer/Aero-Holding Chambers (E-Z SPACER) inhaler, Use as instructed, Disp: 1 each, Rfl: 2  EXAM:  VITALS per patient if applicable:  GENERAL: alert, oriented, appears well and in no acute distress  HEENT: atraumatic, conjunttiva clear, no obvious abnormalities on inspection of external nose and ears  NECK: normal movements of the head and neck  LUNGS: on inspection no signs of respiratory distress, breathing rate appears normal, no obvious gross SOB, gasping or wheezing  CV: no obvious cyanosis  MS: moves all visible extremities without noticeable abnormality  PSYCH/NEURO: pleasant and cooperative, no obvious depression or anxiety, speech and thought processing grossly intact  ASSESSMENT AND PLAN:  Discussed the following assessment and plan:  COVID-19   Discussed treatment options (infusions and oral options and risk of drug interactions), ideal treatment window, potential complications, isolation and precautions for COVID-19.  Discussed possibility of rebound with antivirals and the need to reisolate if it should occur for 5 days. Checked for/reviewed any labs done in the last 90 days with GFR listed in HPI if available. After lengthy discussion, the patient opted for treatment with molnupiravir due to being higher risk for complications of covid or severe disease and other factors. Discussed EUA status of this drug and the fact that there is preliminary limited knowledge of risks/interactions/side effects per EUA document vs possible benefits and precautions. This information was shared with patient during the visit and also was provided in patient instructions. Also, advised that patient discuss risks/interactions and use with pharmacist/treatment team as well.  The patient did want a prescription for cough, Tessalon Rx sent.  Other  symptomatic care measures summarized in patient instructions. Advised to seek prompt in person care if worsening, new symptoms arise, or if is not improving with treatment. Discussed options for inperson care if PCP office not available. Did let this patient know that I only do telemedicine on Tuesdays and Thursdays for [place]. Advised to schedule follow up visit with PCP or [facility] if any further questions or concerns to avoid delays in care.   I discussed the assessment and treatment plan with the patient. The patient was provided an opportunity to ask questions and all were answered. The patient agreed with the plan and demonstrated an understanding of the instructions.     Lucretia Kern, DO

## 2021-09-22 NOTE — Patient Instructions (Signed)
HOME CARE TIPS:  -I sent the medication(s) we discussed to your pharmacy: Meds ordered this encounter  Medications   molnupiravir EUA (LAGEVRIO) 200 mg CAPS capsule    Sig: Take 4 capsules (800 mg total) by mouth 2 (two) times daily for 5 days.    Dispense:  40 capsule    Refill:  0   benzonatate (TESSALON) 200 MG capsule    Sig: Take 1 capsule (200 mg total) by mouth 2 (two) times daily as needed for cough.    Dispense:  20 capsule    Refill:  0     -I sent in the [place] treatment or referral you requested per our discussion. Please see the information provided below and discuss further with the pharmacist/treatment team.   -there is a chance of rebound illness after finishing your treatment. If you become sick again please isolate for an additional 5 days.    -can use tylenol if needed for fevers, aches and pains per instructions  -can use nasal saline a few times per day if you have nasal congestion  -stay hydrated, drink plenty of fluids and eat small healthy meals - avoid dairy  -follow up with your doctor in 2-3 days unless improving and feeling better  -stay home while sick, except to seek medical care. If you have COVID19, ideally it would be best to stay home for a full 10 days since the onset of symptoms PLUS one day of no fever and feeling better. Wear a good mask that fits snugly (such as N95 or KN95) if around others to reduce the risk of transmission.  It was nice to meet you today, and I really hope you are feeling better soon. I help [facility] out with telemedicine visits on Tuesdays and Thursdays and am available for visits on those days. If you have any concerns or questions following this visit please schedule a follow up visit with your Primary Care doctor or seek care at a local urgent care clinic to avoid delays in care.    Seek in person care or schedule a follow up video visit promptly if your symptoms worsen, new concerns arise or you are not improving  with treatment. Call 911 and/or seek emergency care if your symptoms are severe or life threatening.     Fact Sheet for Patients And Caregivers Emergency Use Authorization (EUA) Of LAGEVRIOT (molnupiravir) capsules For Coronavirus Disease 2019 (COVID-19)  What is the most important information I should know about LAGEVRIO? LAGEVRIO may cause serious side effects, including: ? LAGEVRIO may cause harm to your unborn baby. It is not known if LAGEVRIO will harm your baby if you take LAGEVRIO during pregnancy. o LAGEVRIO is not recommended for use in pregnancy. o LAGEVRIO has not been studied in pregnancy. LAGEVRIO was studied in pregnant animals only. When LAGEVRIO was given to pregnant animals, LAGEVRIO caused harm to their unborn babies. o You and your healthcare provider may decide that you should take LAGEVRIO during pregnancy if there are no other COVID-19 treatment options approved or authorized by the FDA that are accessible or clinically appropriate for you. o If you and your healthcare provider decide that you should take LAGEVRIO during pregnancy, you and your healthcare provider should discuss the known and potential benefits and the potential risks of taking LAGEVRIO during pregnancy. For individuals who are able to become pregnant: ? You should use a reliable method of birth control (contraception) consistently and correctly during treatment with LAGEVRIO and for 4 days after  the last dose of LAGEVRIO. Talk to your healthcare provider about reliable birth control methods. ? Before starting treatment with [facility] your healthcare provider may do a pregnancy test to see if you are pregnant before starting treatment with LAGEVRIO. ? Tell your healthcare provider right away if you become pregnant or think you may be pregnant during treatment with LAGEVRIO. Pregnancy Surveillance Program: ? There is a pregnancy surveillance program for individuals who take LAGEVRIO  during pregnancy. The purpose of this program is to collect information about the health of you and your baby. Talk to your healthcare provider about how to take part in this program. ? If you take LAGEVRIO during pregnancy and you agree to participate in the pregnancy surveillance program and allow your healthcare provider to share your information with [place], then your healthcare provider will report your use of [place] during pregnancy to [place]. by calling [phone] or [email or web address]. For individuals who are sexually active with partners who are able to become pregnant: ? It is not known if LAGEVRIO can affect sperm. While the risk is regarded as low, animal studies to fully assess the potential for LAGEVRIO to affect the babies of males treated with LAGEVRIO have not been completed. A reliable method of birth control (contraception) should be used consistently and correctly during treatment with LAGEVRIO and for at least 3 months after the last dose. The risk to sperm beyond 3 months is not known. Studies to understand the risk to sperm beyond 3 months are ongoing. Talk to your healthcare provider about reliable birth control methods. Talk to your healthcare provider if you have questions or concerns about how LAGEVRIO may affect sperm. You are being given this fact sheet because your healthcare provider believes it is necessary to provide you with LAGEVRIO for the treatment of adults with mild-to-moderate coronavirus disease 2019 (COVID-19) with positive results of direct SARS-CoV-2 viral testing, and who are at high risk for progression to severe COVID-19 including hospitalization or death, and for whom other COVID-19 treatment options approved or authorized by the FDA are not accessible or clinically appropriate. The U.S. Food and Drug Administration (FDA) has issued an Emergency Use Authorization (EUA) to make LAGEVRIO available  during the COVID-19 pandemic (for more details about an EUA please see "What is an Emergency Use Authorization?" at the end of this document). LAGEVRIO is not an FDA-approved medicine in the [place]. Read this Fact Sheet for information about LAGEVRIO. Talk to your healthcare provider about your options if you have any questions. It is your choice to take LAGEVRIO.  What is COVID-19? COVID-19 is caused by a virus called a coronavirus. You can get COVID-19 through close contact with another person who has the virus. COVID-19 illnesses have ranged from very mild-to-severe, including illness resulting in death. While information so far suggests that most COVID-19 illness is mild, serious illness can happen and may cause some of your other medical conditions to become worse. Older people and people of all ages with severe, long lasting (chronic) medical conditions like heart disease, lung disease and diabetes, for example seem to be at higher risk of being hospitalized for COVID-19.  What is LAGEVRIO? LAGEVRIO is an investigational medicine used to treat mild-to-moderate COVID-19 in adults: ? with positive results of direct SARS-CoV-2 viral testing, and ? who are at high risk for progression to severe COVID-19 including hospitalization or death, and for whom other COVID-19 treatment options approved or authorized by the FDA  are not accessible or clinically appropriate. The FDA has authorized the emergency use of LAGEVRIO for the treatment of mild-tomoderate COVID-19 in adults under an EUA. For more information on EUA, see the "What is an Emergency Use Authorization (EUA)?" section at the end of this Fact Sheet. LAGEVRIO is not authorized: ? for use in people less than 25 years of age. ? for prevention of COVID-19. ? for people needing hospitalization for COVID-19. ? for use for longer than 5 consecutive days.  What should I tell my healthcare provider before I take LAGEVRIO? Tell  your healthcare provider if you: ? Have any allergies ? Are breastfeeding or plan to breastfeed ? Have any serious illnesses ? Are taking any medicines (prescription, over-the-counter, vitamins, or herbal products).  How do I take LAGEVRIO? ? Take LAGEVRIO exactly as your healthcare provider tells you to take it. ? Take 4 capsules of LAGEVRIO every 12 hours (for example, at 8 am and at 8 pm) ? Take LAGEVRIO for 5 days. It is important that you complete the full 5 days of treatment with LAGEVRIO. Do not stop taking LAGEVRIO before you complete the full 5 days of treatment, even if you feel better. ? Take LAGEVRIO with or without food. ? You should stay in isolation for as long as your healthcare provider tells you to. Talk to your healthcare provider if you are not sure about how to properly isolate while you have COVID-19. ? Swallow LAGEVRIO capsules whole. Do not open, break, or crush the capsules. If you cannot swallow capsules whole, tell your healthcare provider. ? What to do if you miss a dose: o If it has been less than 10 hours since the missed dose, take it as soon as you remember o If it has been more than 10 hours since the missed dose, skip the missed dose and take your dose at the next scheduled time. ? Do not double the dose of LAGEVRIO to make up for a missed dose.  What are the important possible side effects of LAGEVRIO? ? See, "What is the most important information I should know about LAGEVRIO?" ? Allergic Reactions. Allergic reactions can happen in people taking LAGEVRIO, even after only 1 dose. Stop taking LAGEVRIO and call your healthcare provider right away if you get any of the following symptoms of an allergic reaction: o hives o rapid heartbeat o trouble swallowing or breathing o swelling of the mouth, lips, or face o throat tightness o hoarseness o skin rash The most common side effects of LAGEVRIO are: ? diarrhea ? nausea ? dizziness These are not  all the possible side effects of LAGEVRIO. Not many people have taken LAGEVRIO. Serious and unexpected side effects may happen. This medicine is still being studied, so it is possible that all of the risks are not known at this time.  What other treatment choices are there?  Veklury (remdesivir) is FDA-approved as an intravenous (IV) infusion for the treatment of mildto-moderate [id number]-33 in certain adults and children. Talk with your doctor to see if [place] is appropriate for you. Like LAGEVRIO, FDA may also allow for the emergency use of other medicines to treat people with COVID-19. Go to LacrosseProperties.si for more information. It is your choice to be treated or not to be treated with LAGEVRIO. Should you decide not to take it, it will not change your standard medical care.  What if I am breastfeeding? Breastfeeding is not recommended during treatment with LAGEVRIO and for 4 days after the  last dose of LAGEVRIO. If you are breastfeeding or plan to breastfeed, talk to your healthcare provider about your options and specific situation before taking LAGEVRIO.  How do I report side effects with LAGEVRIO? Contact your healthcare provider if you have any side effects that bother you or do not go away. Report side effects to FDA MedWatch at SmoothHits.hu or call 1-800-FDA-1088 (1- 470-598-7291).  How should I store [place]? ? Store LAGEVRIO capsules at room temperature between 30F to 62F (20C to 25C). ? Keep LAGEVRIO and all medicines out of the reach of children and pets. How can I learn more about COVID-19? ? Ask your healthcare provider. ? Visit SeekRooms.co.uk ? Contact your local or state public health department. ? Call [place] at [phone] (toll free in the [place] ? Visit [email or web address].com  What Is an Emergency Use Authorization (EUA)? The  [place] FDA has made [place] available under an emergency access mechanism called an Emergency Use Authorization (EUA) The EUA is supported by a Presenter, broadcasting Health and Human Service (HHS) declaration that circumstances exist to justify emergency use of drugs and biological products during the COVID-19 pandemic. LAGEVRIO for the treatment of mild-to-moderate COVID-19 in adults with positive results of direct SARS-CoV-2 viral testing, who are at high risk for progression to severe COVID-19, including hospitalization or death, and for whom alternative COVID-19 treatment options approved or authorized by FDA are not accessible or clinically appropriate, has not undergone the same type of review as an FDA-approved product. In issuing an EUA under the IOPPU-68 public health emergency, the FDA has determined, among other things, that based on the total amount of scientific evidence available including data from adequate and well-controlled clinical trials, if available, it is reasonable to believe that the product may be effective for diagnosing, treating, or preventing COVID-19, or a serious or life-threatening disease or condition caused by COVID-19; that the known and potential benefits of the product, when used to diagnose, treat, or prevent such disease or condition, outweigh the known and potential risks of such product; and that there are no adequate, approved, and available alternatives.  All of these criteria must be met to allow for the product to be used in the treatment of patients during the COVID-19 pandemic. The EUA for LAGEVRIO is in effect for the duration of the COVID-19 declaration justifying emergency use of LAGEVRIO, unless terminated or revoked (after which LAGEVRIO may no longer be used under the EUA). For patent information: http://rogers.info/ Copyright  2021-2022 [place] [place], [place] and its affiliates. All rights  reserved. [id number] Revised: March 2022

## 2021-10-12 ENCOUNTER — Other Ambulatory Visit: Payer: Self-pay | Admitting: Cardiology

## 2021-10-12 ENCOUNTER — Other Ambulatory Visit: Payer: Self-pay | Admitting: Family Medicine

## 2021-10-17 ENCOUNTER — Other Ambulatory Visit: Payer: Self-pay | Admitting: Neurology

## 2021-10-17 DIAGNOSIS — G250 Essential tremor: Secondary | ICD-10-CM

## 2021-10-19 ENCOUNTER — Other Ambulatory Visit: Payer: Self-pay

## 2021-10-20 ENCOUNTER — Other Ambulatory Visit: Payer: Self-pay

## 2021-10-20 ENCOUNTER — Ambulatory Visit (INDEPENDENT_AMBULATORY_CARE_PROVIDER_SITE_OTHER): Payer: Medicare HMO | Admitting: Family Medicine

## 2021-10-20 VITALS — BP 160/82 | HR 83 | Temp 97.8°F | Wt 222.0 lb

## 2021-10-20 DIAGNOSIS — J4541 Moderate persistent asthma with (acute) exacerbation: Secondary | ICD-10-CM | POA: Diagnosis not present

## 2021-10-20 MED ORDER — DOXYCYCLINE HYCLATE 100 MG PO CAPS: 100.0000 mg | ORAL_CAPSULE | Freq: Two times a day (BID) | ORAL | 0 refills | Status: DC

## 2021-10-20 MED ORDER — PREDNISONE 20 MG PO TABS: ORAL_TABLET | ORAL | 0 refills | Status: DC

## 2021-10-20 NOTE — Progress Notes (Signed)
Established Patient Office Visit  Subjective:  Patient ID: Matthew Trujillo, male    DOB: [birth year]  Age: 70 y.o. MRN: [id number]  CC:  Chief Complaint  Patient presents with   Cough    X 1 week, productive cough, green/grey mucus, SOB, using albuterol to help breathing    HPI Matthew Trujillo presents for 1 week history of cough and wheezing.  Cough productive of green sputum.  Intermittent dyspnea.  He has persistent asthma which is generally well controlled.  He had similar flareup around 2019 which improved with antibiotics and prednisone.  He has rarely had to take oral prednisone in the past.  He is on Flovent regularly.  He has albuterol nebulizer at home which helps temporarily.  1 week history of wheezing which is worse at night.  COVID testing negative x2.  He actually had COVID infection about a month ago and felt like he had recovered fully from that.  Denies any sore throat.  Recent mild nasal congestion.  Past Medical History:  Diagnosis Date   ALLERGIC RHINITIS 05/21/2010   ASTHMA 05/21/2010   Asthma    Factor V Leiden ([place])    Hx of adenomatous colonic polyps    HYPERTENSION 05/21/2010   Diffiult to control   Hyperthyroidism    Inguinal hernia    Ischemic colitis ([place])    Lower GI bleed    Prolonged QT interval    PULMONARY EMBOLISM 6/96/7893   Umbilical hernia     Past Surgical History:  Procedure Laterality Date   ACHILLES TENDON REPAIR     COLONOSCOPY     last 2019, multiple   DOPPLER ECHOCARDIOGRAPHY  12/30/2011   EF >55%; MILD LVH,MILD LAE,NORMAL DIASTOLIC FUNCTION   RENAL DOPPLER  12/30/2011   NORMAL RENAL   UMBILICAL HERNIA REPAIR N/A 01/04/2020   Procedure: UMBILICAL HERNIA REPAIR WITH MESH;  Surgeon: Jovita Kussmaul, MD;  Location: [facility];  Service: General;  Laterality: N/A;    Family History  Problem Relation Age of Onset   Kidney failure Father        single kidney; hx of recurrent infections   Renal cancer Father     Pancreatic cancer Brother    Hypertension Mother    Hypothyroidism Mother    Hypothyroidism Sister    Heart disease Neg Hx    Colon cancer Neg Hx    Esophageal cancer Neg Hx    Stomach cancer Neg Hx    Rectal cancer Neg Hx     Social History   Socioeconomic History   Marital status: Married    Spouse name: Not on file   Number of children: Not on file   Years of education: Not on file   Highest education level: Not on file  Occupational History   Not on file  Tobacco Use   Smoking status: Never   Smokeless tobacco: Never  Vaping Use   Vaping Use: Never used  Substance and Sexual Activity   Alcohol use: Yes    Comment: 2-3 times/week   Drug use: No   Sexual activity: Not on file  Other Topics Concern   Not on file  Social History Narrative   He is a married father of 2. [employer] employee   He does exercise about 3-4 days a week with no true symptoms.    Does not smoke and occasional social alcohol.    Right handed   Social Determinants of Health   Financial Resource Strain:  Low Risk    Difficulty of Paying Living Expenses: Not hard at all  Food Insecurity: No Food Insecurity   Worried About Running Out of Food in the Last Year: Never true   Ran Out of Food in the Last Year: Never true  Transportation Needs: No Transportation Needs   Lack of Transportation (Medical): No   Lack of Transportation (Non-Medical): No  Physical Activity: Sufficiently Active   Days of Exercise per Week: 5 days   Minutes of Exercise per Session: 60 min  Stress: No Stress Concern Present   Feeling of Stress : Not at all  Social Connections: Moderately Isolated   Frequency of Communication with Friends and Family: Three times a week   Frequency of Social Gatherings with Friends and Family: Three times a week   Attends Religious Services: Never   Active Member of Clubs or Organizations: No   Attends Archivist Meetings: Never   Marital Status: Married  Human resources officer  Violence: Not At Risk   Fear of Current or Ex-Partner: No   Emotionally Abused: No   Physically Abused: No   Sexually Abused: No    Outpatient Medications Prior to Visit  Medication Sig Dispense Refill   albuterol (PROVENTIL) (2.5 MG/3ML) 0.083% nebulizer solution Take 3 mLs (2.5 mg total) by nebulization every 6 (six) hours as needed for wheezing or shortness of breath. 75 mL 12   albuterol (VENTOLIN HFA) 108 (90 Base) MCG/ACT inhaler INHALE 2 PUFFS INTO THE LUNGS EVERY 4 HOURS AS NEEDED FOR WHEEZE 18 each 2   amLODipine (NORVASC) 10 MG tablet TAKE 1 TABLET BY MOUTH EVERY DAY 90 tablet 1   benzonatate (TESSALON) 200 MG capsule Take 1 capsule (200 mg total) by mouth 2 (two) times daily as needed for cough. 20 capsule 0   Calcium Carbonate-Vitamin D 600-400 MG-UNIT tablet Take 1 tablet by mouth daily.     chlorthalidone (HYGROTON) 50 MG tablet TAKE 1 TABLET BY MOUTH EVERY DAY 90 tablet 1   Cholecalciferol (VITAMIN D-3) 5000 units TABS Take 5,000 Units by mouth daily.     FLOVENT HFA 110 MCG/ACT inhaler INHALE 2 PUFFS BY MOUTH TWICE A DAY. RINSE MOUTH AFTER USE 36 each 0   fluticasone (FLONASE) 50 MCG/ACT nasal spray PLACE 1 SPRAY INTO BOTH NOSTRILS DAILY. 48 mL 1   methimazole (TAPAZOLE) 5 MG tablet Take 1 tablet (5 mg total) by mouth daily. 90 tablet 3   Multiple Vitamin (MULTIVITAMIN) tablet Take 1 tablet by mouth daily.     Omega-3 Fatty Acids (FISH OIL) 1000 MG CAPS Take by mouth 2 (two) times daily.     primidone (MYSOLINE) 50 MG tablet TAKE 1 TABLET (50 MG TOTAL) BY MOUTH IN THE MORNING AND AT BEDTIME. 180 tablet 0   ramipril (ALTACE) 10 MG capsule TAKE 1 CAPSULE BY MOUTH EVERY DAY 90 capsule 0   rosuvastatin (CRESTOR) 5 MG tablet TAKE 1 TABLET BY MOUTH EVERY DAY 90 tablet 0   SAVAYSA 60 MG TABS tablet TAKE 1 TABLET BY MOUTH EVERY DAY 30 tablet 5   Spacer/Aero-Holding Chambers (E-Z SPACER) inhaler Use as instructed 1 each 2   No facility-administered medications prior to visit.     Allergies  Allergen Reactions   Celebrex [Celecoxib]     Blood in stool   Other     Animal dander causes sneezing and watery eyes   Neosporin [Bacitracin-Polymyxin B] Rash    ROS Review of Systems  Constitutional:  Negative for chills and fever.  HENT:  Negative for sinus pressure, sinus pain and sore throat.   Respiratory:  Positive for cough, shortness of breath and wheezing.   Cardiovascular:  Negative for chest pain and leg swelling.     Objective:    Physical Exam Vitals reviewed.  Constitutional:      Appearance: Normal appearance.  Cardiovascular:     Rate and Rhythm: Normal rate.  Pulmonary:     Effort: Pulmonary effort is normal. No respiratory distress.     Breath sounds: Wheezing present. No rales.  Musculoskeletal:     Cervical back: Neck supple.  Lymphadenopathy:     Cervical: No cervical adenopathy.  Neurological:     Mental Status: He is alert.    BP (!) 160/82 (BP Location: Left Arm, Patient Position: Sitting, Cuff Size: Normal)   Pulse 83   Temp 97.8 F (36.6 C) (Oral)   Wt 222 lb (100.7 kg)   SpO2 97%   BMI 28.50 kg/m  Wt Readings from Last 3 Encounters:  10/20/21 222 lb (100.7 kg)  07/10/21 219 lb (99.3 kg)  05/15/21 225 lb (102.1 kg)     Health Maintenance Due  Topic Date Due   INFLUENZA VACCINE  07/27/2021   COVID-19 Vaccine (5 - Booster for Pfizer series) 08/20/2021    There are no preventive care reminders to display for this patient.  Lab Results  Component Value Date   TSH 2.31 05/07/2021   Lab Results  Component Value Date   WBC 9.2 11/24/2020   HGB 16.6 11/24/2020   HCT 49.2 11/24/2020   MCV 90.8 11/24/2020   PLT 329 11/24/2020   Lab Results  Component Value Date   NA 139 11/24/2020   K 4.0 11/24/2020   CO2 28 11/24/2020   GLUCOSE 104 (H) 11/24/2020   BUN 21 11/24/2020   CREATININE 0.91 11/24/2020   BILITOT 1.0 11/24/2020   ALKPHOS 52 04/29/2020   AST 24 11/24/2020   ALT 43 11/24/2020   PROT 7.0  11/24/2020   ALBUMIN 4.5 04/29/2020   CALCIUM 10.0 11/24/2020   ANIONGAP 12 01/01/2020   GFR 82.10 04/29/2020   Lab Results  Component Value Date   CHOL 130 08/21/2020   Lab Results  Component Value Date   HDL 45 08/21/2020   Lab Results  Component Value Date   LDLCALC 66 08/21/2020   Lab Results  Component Value Date   TRIG 109 08/21/2020   Lab Results  Component Value Date   CHOLHDL 2.9 08/21/2020   Lab Results  Component Value Date   HGBA1C 5.5 08/08/2019      Assessment & Plan:   Acute exacerbation of chronic/persistent asthma.  Patient has persistent asthma and has significant wheezing on exam today but is in no respiratory distress.  Pulse oximeter 97% room air.  -Start prednisone 20 mg 2 tablets daily for 5 days -Doxycycline 100 mg twice daily for 7 days -Continue home albuterol nebulizer every 4-6 hours as needed -Follow-up for any increased shortness of breath or any persistent or worsening symptoms -Continue over-the-counter Mucinex twice daily   Meds ordered this encounter  Medications   predniSONE (DELTASONE) 20 MG tablet    Sig: Take two tablets by mouth once daily for 5 days.    Dispense:  10 tablet    Refill:  0   doxycycline (VIBRAMYCIN) 100 MG capsule    Sig: Take 1 capsule (100 mg total) by mouth 2 (two) times daily.    Dispense:  14 capsule  Refill:  0    Follow-up: No follow-ups on file.    Carolann Littler, MD

## 2021-11-05 ENCOUNTER — Other Ambulatory Visit: Payer: Self-pay | Admitting: Cardiology

## 2021-11-06 ENCOUNTER — Telehealth: Payer: Self-pay

## 2021-11-06 NOTE — Telephone Encounter (Signed)
Last OV for preventative health on 11/24/20; AWV 07/10/21. No appt noted at this time.  Pt notified of above. Preventative OV scheduled for 11/30/21

## 2021-11-29 ENCOUNTER — Encounter: Payer: Self-pay | Admitting: Family Medicine

## 2021-11-30 ENCOUNTER — Ambulatory Visit (INDEPENDENT_AMBULATORY_CARE_PROVIDER_SITE_OTHER): Payer: Medicare HMO | Admitting: Family Medicine

## 2021-11-30 ENCOUNTER — Encounter: Payer: Self-pay | Admitting: Family Medicine

## 2021-11-30 ENCOUNTER — Other Ambulatory Visit: Payer: Self-pay | Admitting: Family Medicine

## 2021-11-30 VITALS — BP 130/78 | HR 78 | Temp 97.7°F | Ht 73.25 in | Wt 225.7 lb

## 2021-11-30 DIAGNOSIS — Z86711 Personal history of pulmonary embolism: Secondary | ICD-10-CM

## 2021-11-30 DIAGNOSIS — E785 Hyperlipidemia, unspecified: Secondary | ICD-10-CM

## 2021-11-30 DIAGNOSIS — Z23 Encounter for immunization: Secondary | ICD-10-CM

## 2021-11-30 DIAGNOSIS — E0500 Thyrotoxicosis with diffuse goiter without thyrotoxic crisis or storm: Secondary | ICD-10-CM

## 2021-11-30 DIAGNOSIS — J4520 Mild intermittent asthma, uncomplicated: Secondary | ICD-10-CM | POA: Diagnosis not present

## 2021-11-30 DIAGNOSIS — Z0000 Encounter for general adult medical examination without abnormal findings: Secondary | ICD-10-CM | POA: Diagnosis not present

## 2021-11-30 DIAGNOSIS — D6851 Activated protein C resistance: Secondary | ICD-10-CM

## 2021-11-30 DIAGNOSIS — I10 Essential (primary) hypertension: Secondary | ICD-10-CM

## 2021-11-30 DIAGNOSIS — I82402 Acute embolism and thrombosis of unspecified deep veins of left lower extremity: Secondary | ICD-10-CM

## 2021-11-30 MED ORDER — ALBUTEROL SULFATE (2.5 MG/3ML) 0.083% IN NEBU: 2.5000 mg | INHALATION_SOLUTION | Freq: Four times a day (QID) | RESPIRATORY_TRACT | 12 refills | Status: DC | PRN

## 2021-11-30 NOTE — Progress Notes (Signed)
Matthew Trujillo DOB: 05/13/1951 Encounter date: 11/30/2021  This is a 69 y.o. male who presents for complete physical   History of present illness/Additional concerns: Last visit was 1 year ago.  Hypertension: Amlodipine 10 mg, chlorthalidone 50 mg, ramipril 10 mg. Not checking at home regularly.   Hyperlipidemia:  Crestor 5 mg daily. This is only new medication. He did note tremor prior to statin.    Hyperthyroidism: Methimazole; managed by endocrinologist.   Had visits this month.  Continues on 5 mg daily dose.   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 This was the most appropriate anticoagulation choice due to difficulty with maintaining stable INR when on coumadin in the past, and interaction of other anticoagulants (eliquis, xarelto) with primidone. He has not had luck with alternatives to primidone for essential tremor due to intolerance of beta blockers with asthma and side effects of topirimate (cognitive changes/depression). States that he has to get 30 tablets at a time per insurance.    Allergies: Flonase - just if needed - starts using it when grandson sick or heavy pollen season.    Asthma: Flovent, albuterol. Breathing has been doing well. was a little wheezy on Thanksgiving but been ok since then. Ended up using proventil more this past year due to bronchitis, covid from grandson. Would just like to get refills of albuterol. Keeping grandson at their house but now that he is in preschool it has been rough.    Colonoscopy done 10/2018; repeat 11/21/23   Does have some arthritis left ring finger; broke in past.   Enjoyed hike at [employer]. Just hard on joints.    Past Medical History:  Diagnosis Date   ALLERGIC RHINITIS 05/21/2010   ASTHMA 05/21/2010   Asthma    Factor V Leiden ([place])    Hx of adenomatous colonic polyps    HYPERTENSION 05/21/2010   Diffiult to control   Hyperthyroidism    Inguinal hernia    Ischemic colitis ([place])    Lower GI  bleed    Prolonged QT interval    PULMONARY EMBOLISM 7/34/1937   Umbilical hernia    Past Surgical History:  Procedure Laterality Date   ACHILLES TENDON REPAIR     COLONOSCOPY     last 2019, multiple   DOPPLER ECHOCARDIOGRAPHY  12/30/2011   EF >55%; MILD LVH,MILD LAE,NORMAL DIASTOLIC FUNCTION   RENAL DOPPLER  12/30/2011   NORMAL RENAL   UMBILICAL HERNIA REPAIR N/A 01/04/2020   Procedure: UMBILICAL HERNIA REPAIR WITH MESH;  Surgeon: Jovita Kussmaul, MD;  Location: [place];  Service: General;  Laterality: N/A;   Allergies  Allergen Reactions   Celebrex [Celecoxib]     Blood in stool   Other     Animal dander causes sneezing and watery eyes   Neosporin [Bacitracin-Polymyxin B] Rash   Current Meds  Medication Sig   albuterol (VENTOLIN HFA) 108 (90 Base) MCG/ACT inhaler INHALE 2 PUFFS INTO THE LUNGS EVERY 4 HOURS AS NEEDED FOR WHEEZE   amLODipine (NORVASC) 10 MG tablet TAKE 1 TABLET BY MOUTH EVERY DAY   Calcium Carbonate-Vitamin D 600-400 MG-UNIT tablet Take 1 tablet by mouth daily.   chlorthalidone (HYGROTON) 50 MG tablet TAKE 1 TABLET BY MOUTH EVERY DAY   Cholecalciferol (VITAMIN D-3) 5000 units TABS Take 5,000 Units by mouth daily.   FLOVENT HFA 110 MCG/ACT inhaler INHALE 2 PUFFS BY MOUTH TWICE A DAY. RINSE MOUTH AFTER USE   fluticasone (FLONASE) 50 MCG/ACT nasal spray PLACE 1  SPRAY INTO BOTH NOSTRILS DAILY.   methimazole (TAPAZOLE) 5 MG tablet Take 1 tablet (5 mg total) by mouth daily.   Multiple Vitamin (MULTIVITAMIN) tablet Take 1 tablet by mouth daily.   Omega-3 Fatty Acids (FISH OIL) 1000 MG CAPS Take by mouth 2 (two) times daily.   primidone (MYSOLINE) 50 MG tablet TAKE 1 TABLET (50 MG TOTAL) BY MOUTH IN THE MORNING AND AT BEDTIME.   rosuvastatin (CRESTOR) 5 MG tablet TAKE 1 TABLET BY MOUTH EVERY DAY   SAVAYSA 60 MG TABS tablet TAKE 1 TABLET BY MOUTH EVERY DAY   Spacer/Aero-Holding Chambers (E-Z SPACER) inhaler Use as instructed   [DISCONTINUED] albuterol  (PROVENTIL) (2.5 MG/3ML) 0.083% nebulizer solution Take 3 mLs (2.5 mg total) by nebulization every 6 (six) hours as needed for wheezing or shortness of breath.   [DISCONTINUED] benzonatate (TESSALON) 200 MG capsule Take 1 capsule (200 mg total) by mouth 2 (two) times daily as needed for cough.   [DISCONTINUED] ramipril (ALTACE) 10 MG capsule Take 1 capsule (10 mg total) by mouth daily. Patient need a appointment for future refills. First attempt   Social History   Tobacco Use   Smoking status: Never   Smokeless tobacco: Never  Substance Use Topics   Alcohol use: Yes    Comment: 2-3 times/week   Family History  Problem Relation Age of Onset   Kidney failure Father        single kidney; hx of recurrent infections   Renal cancer Father    Pancreatic cancer Brother    Hypertension Mother    Hypothyroidism Mother    Hypothyroidism Sister    Heart disease Neg Hx    Colon cancer Neg Hx    Esophageal cancer Neg Hx    Stomach cancer Neg Hx    Rectal cancer Neg Hx      Review of Systems  Constitutional:  Negative for activity change, appetite change, chills, fatigue, fever and unexpected weight change.  HENT:  Negative for congestion, ear pain, hearing loss, sinus pressure, sinus pain, sore throat and trouble swallowing.   Eyes:  Negative for pain and visual disturbance.  Respiratory:  Negative for cough, chest tightness, shortness of breath and wheezing.   Cardiovascular:  Negative for chest pain, palpitations and leg swelling.  Gastrointestinal:  Negative for abdominal distention, abdominal pain, blood in stool, constipation, diarrhea, nausea and vomiting.  Genitourinary:  Negative for decreased urine volume, difficulty urinating, dysuria, penile pain and testicular pain.  Musculoskeletal:  Negative for arthralgias, back pain and joint swelling.  Skin:  Negative for rash.  Neurological:  Negative for dizziness, weakness, numbness and headaches.  Hematological:  Negative for  adenopathy. Does not bruise/bleed easily.  Psychiatric/Behavioral:  Negative for agitation, sleep disturbance and suicidal ideas. The patient is not nervous/anxious.    CBC:  Lab Results  Component Value Date   WBC 9.8 12/01/2021   HGB 15.5 12/01/2021   HCT 45.8 12/01/2021   MCH 30.6 11/24/2020   MCHC 33.8 12/01/2021   RDW 14.6 12/01/2021   PLT 308.0 12/01/2021   MPV 10.8 11/24/2020   CMP: Lab Results  Component Value Date   NA 138 12/01/2021   K 3.6 12/01/2021   CL 101 12/01/2021   CO2 30 12/01/2021   ANIONGAP 12 01/01/2020   GLUCOSE 98 12/01/2021   BUN 22 12/01/2021   CREATININE 0.99 12/01/2021   CREATININE 0.91 11/24/2020   GFRAA >60 01/01/2020   CALCIUM 9.7 12/01/2021   PROT 7.1 12/01/2021   BILITOT  0.9 12/01/2021   ALKPHOS 52 12/01/2021   ALT 37 12/01/2021   AST 25 12/01/2021   LIPID: Lab Results  Component Value Date   CHOL 134 12/01/2021   TRIG 111.0 12/01/2021   HDL 45.90 12/01/2021   LDLCALC 66 12/01/2021   LDLCALC 66 08/21/2020    Objective:  BP 130/78 (BP Location: Left Arm, Patient Position: Sitting, Cuff Size: Large)   Pulse 78   Temp 97.7 F (36.5 C) (Oral)   Ht 6' 1.25" (1.861 m)   Wt 225 lb 11.2 oz (102.4 kg)   SpO2 98%   BMI 29.57 kg/m   Weight: 225 lb 11.2 oz (102.4 kg)   BP Readings from Last 3 Encounters:  11/30/21 130/78  10/20/21 (!) 160/82  07/10/21 140/80   Wt Readings from Last 3 Encounters:  11/30/21 225 lb 11.2 oz (102.4 kg)  10/20/21 222 lb (100.7 kg)  07/10/21 219 lb (99.3 kg)    Physical Exam Constitutional:      General: He is not in acute distress.    Appearance: He is well-developed.  HENT:     Head: Normocephalic and atraumatic.     Right Ear: External ear normal.     Left Ear: External ear normal.     Nose: Nose normal.     Mouth/Throat:     Pharynx: No oropharyngeal exudate.  Eyes:     Conjunctiva/sclera: Conjunctivae normal.     Pupils: Pupils are equal, round, and reactive to light.  Neck:      Thyroid: No thyromegaly.  Cardiovascular:     Rate and Rhythm: Normal rate and regular rhythm.     Heart sounds: Normal heart sounds. No murmur heard.   No friction rub. No gallop.  Pulmonary:     Effort: Pulmonary effort is normal. No respiratory distress.     Breath sounds: Normal breath sounds. No stridor. No wheezing or rales.  Abdominal:     General: Bowel sounds are normal.     Palpations: Abdomen is soft.  Musculoskeletal:        General: Normal range of motion.     Cervical back: Neck supple.  Skin:    General: Skin is warm and dry.  Neurological:     Mental Status: He is alert and oriented to person, place, and time.  Psychiatric:        Behavior: Behavior normal.        Thought Content: Thought content normal.        Judgment: Judgment normal.    Assessment/Plan: There are no preventive care reminders to display for this patient.  Health Maintenance reviewed -up-to-date..  1. Preventative health care Keep up with regular activity level.  Caring for her grandson regularly keeps him pretty active. - Ambulatory referral to Ophthalmology  2. Essential hypertension Blood pressures been well controlled.  Continue with amlodipine 10 mg, chlorthalidone 50 mg, ramipril 10 mg daily. - CBC with Differential/Platelet; Future - Comprehensive metabolic panel; Future  3. 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  See above.  Requires Savaysa due to other anticoagulants interacting with his primidone, difficulty with regulation of INR on Coumadin historically.  4. Asthma in adult, mild intermittent, uncomplicated Has had some flares with asthma due to recent viral illnesses.  Refilled albuterol for nebulizer today to use as needed.  Generally symptoms are well controlled with current Flovent inhaler use.  5. Graves disease Follows regularly with endocrinology. - TSH; Future - T4, free;  Future  6. Factor V Leiden  ([place]) See above.  On Savaysa.  7. Hyperlipidemia with target LDL less than 100 On Crestor 5 mg daily. - Lipid panel; Future  8. History of pulmonary embolism See above.  9. Need for pneumococcal vaccination - Pneumococcal conjugate vaccine 20-valent (Prevnar 20)    Return in about 6 months (around 05/31/2022) for Chronic condition visit.  Micheline Rough, MD

## 2021-12-01 ENCOUNTER — Other Ambulatory Visit (INDEPENDENT_AMBULATORY_CARE_PROVIDER_SITE_OTHER): Payer: Medicare HMO

## 2021-12-01 ENCOUNTER — Other Ambulatory Visit: Payer: Self-pay | Admitting: Cardiology

## 2021-12-01 DIAGNOSIS — E0500 Thyrotoxicosis with diffuse goiter without thyrotoxic crisis or storm: Secondary | ICD-10-CM | POA: Diagnosis not present

## 2021-12-01 DIAGNOSIS — E785 Hyperlipidemia, unspecified: Secondary | ICD-10-CM | POA: Diagnosis not present

## 2021-12-01 DIAGNOSIS — I10 Essential (primary) hypertension: Secondary | ICD-10-CM

## 2021-12-01 LAB — LIPID PANEL
Cholesterol: 134 mg/dL (ref 0–200)
HDL: 45.9 mg/dL (ref >39.00)
LDL Cholesterol: 66 mg/dL (ref 0–99)
NonHDL: 88.06
Total CHOL/HDL Ratio: 3
Triglycerides: 111 mg/dL (ref 0.0–149.0)
VLDL: 22.2 mg/dL (ref 0.0–40.0)

## 2021-12-01 LAB — COMPREHENSIVE METABOLIC PANEL
ALT: 37 U/L (ref 0–53)
AST: 25 U/L (ref 0–37)
Albumin: 4.5 g/dL (ref 3.5–5.2)
Alkaline Phosphatase: 52 U/L (ref 39–117)
BUN: 22 mg/dL (ref 6–23)
CO2: 30 mEq/L (ref 19–32)
Calcium: 9.7 mg/dL (ref 8.4–10.5)
Chloride: 101 mEq/L (ref 96–112)
Creatinine, Ser: 0.99 mg/dL (ref 0.40–1.50)
GFR: 78.31 mL/min (ref >60.00)
Glucose, Bld: 98 mg/dL (ref 70–99)
Potassium: 3.6 mEq/L (ref 3.5–5.1)
Sodium: 138 mEq/L (ref 135–145)
Total Bilirubin: 0.9 mg/dL (ref 0.2–1.2)
Total Protein: 7.1 g/dL (ref 6.0–8.3)

## 2021-12-01 LAB — CBC WITH DIFFERENTIAL/PLATELET
Basophils Absolute: 0.1 10*3/uL (ref 0.0–0.1)
Basophils Relative: 1.1 % (ref 0.0–3.0)
Eosinophils Absolute: 0.2 10*3/uL (ref 0.0–0.7)
Eosinophils Relative: 2.4 % (ref 0.0–5.0)
HCT: 45.8 % (ref 39.0–52.0)
Hemoglobin: 15.5 g/dL (ref 13.0–17.0)
Lymphocytes Relative: 36 % (ref 12.0–46.0)
Lymphs Abs: 3.5 10*3/uL (ref 0.7–4.0)
MCHC: 33.8 g/dL (ref 30.0–36.0)
MCV: 90.8 fl (ref 78.0–100.0)
Monocytes Absolute: 0.9 10*3/uL (ref 0.1–1.0)
Monocytes Relative: 9.7 % (ref 3.0–12.0)
Neutro Abs: 5 10*3/uL (ref 1.4–7.7)
Neutrophils Relative %: 50.8 % (ref 43.0–77.0)
Platelets: 308 10*3/uL (ref 150.0–400.0)
RBC: 5.04 Mil/uL (ref 4.22–5.81)
RDW: 14.6 % (ref 11.5–15.5)
WBC: 9.8 10*3/uL (ref 4.0–10.5)

## 2021-12-01 LAB — TSH: TSH: 3.22 u[IU]/mL (ref 0.35–5.50)

## 2021-12-01 LAB — T4, FREE: Free T4: 0.74 ng/dL (ref 0.60–1.60)

## 2021-12-06 ENCOUNTER — Other Ambulatory Visit: Payer: Self-pay | Admitting: Family Medicine

## 2021-12-09 ENCOUNTER — Encounter: Payer: Self-pay | Admitting: Family Medicine

## 2021-12-12 ENCOUNTER — Other Ambulatory Visit: Payer: Self-pay | Admitting: Family Medicine

## 2022-01-03 ENCOUNTER — Other Ambulatory Visit: Payer: Self-pay | Admitting: Family Medicine

## 2022-01-05 ENCOUNTER — Telehealth: Payer: Self-pay | Admitting: Pharmacist

## 2022-01-05 NOTE — Telephone Encounter (Signed)
[employer] to get the process started for the tier exception for the patient's Savaysa. Insurance representative informed me that a prior authorization is needed to be completed first prior to working on the tier exception. She gave me the Covermymeds key of: [id number].   Will route to PCP to make her aware of this initial step.

## 2022-01-06 ENCOUNTER — Other Ambulatory Visit: Payer: Self-pay | Admitting: Family Medicine

## 2022-01-06 NOTE — Telephone Encounter (Signed)
Thanks so much! Let me know if I need to do anything else, but I assume this info is passed to pharmacy. Appreciate your help!

## 2022-01-06 NOTE — Telephone Encounter (Signed)
PA started in Cover My Meds Key: [id number]

## 2022-01-06 NOTE — Telephone Encounter (Signed)
This is the rx that needs PA. Thanks :)

## 2022-01-06 NOTE — Telephone Encounter (Signed)
As a member of [employer] ([employer]), we are pleased to inform you that, upon review of the information provided by you or your doctor, we have approved the requested  coverage for the following prescription drug(s): SAVAYSA Tablet Type of coverage approved: Non-Formulary This approval authorizes your coverage from 12/27/2021 - 12/26/2022, unless we notify you otherwise, and as long as the following conditions apply: you remain enrolled in our Medicare Part D prescription drug plan,your physician or other prescriber continues to prescribe the medication for you, and the medication continues to be safe for treating your condition.

## 2022-01-06 NOTE — Telephone Encounter (Signed)
Please help me with this/let me know what I need to do. This is really his only medication choice. This is for a continuation of medication. Alternatives interact with his current medications. I just want to make sure this is streamlined so he doesn't run out of meds.

## 2022-01-06 NOTE — Telephone Encounter (Signed)
I sent you another message on him; this is the medication that he needs, so we need to process a Prior auth. Appreciate any help moving forward to get him meds.

## 2022-01-07 ENCOUNTER — Encounter: Payer: Self-pay | Admitting: Endocrinology

## 2022-01-07 ENCOUNTER — Ambulatory Visit: Payer: Medicare HMO | Admitting: Endocrinology

## 2022-01-07 ENCOUNTER — Other Ambulatory Visit: Payer: Self-pay

## 2022-01-07 DIAGNOSIS — E0500 Thyrotoxicosis with diffuse goiter without thyrotoxic crisis or storm: Secondary | ICD-10-CM

## 2022-01-07 MED ORDER — METHIMAZOLE 5 MG PO TABS: 5.0000 mg | ORAL_TABLET | Freq: Every day | ORAL | 3 refills | Status: DC

## 2022-01-07 NOTE — Telephone Encounter (Signed)
Please see prior note in which a prior auth was already sent. Spoke with Summer at the pharmacy and she stated the Rx is no longer on the patients formulary.  Message sent to PCP.

## 2022-01-07 NOTE — Patient Instructions (Addendum)
Please continue the same methimazole.   If ever you have fever while taking methimazole, stop it and call us, even if the reason is obvious, because of the risk of a rare side-effect.   It is best to never miss the medication.  However, if you do miss it, next best is to double up the next time.   I would be happy to see you back here as needed.

## 2022-01-07 NOTE — Progress Notes (Signed)
Subjective:    Patient ID: Matthew Trujillo, male    DOB: 1951-06-13, 71 y.o.   MRN: [id number]  HPI Pt returns for f/u of hyperthyroidism (dx'ed 2017; US showed autoimmune thyroidtis; he declines RAI).  He takes tapazole 5 mg qd, as rx'ed.  pt states he feels well in general.  Specifically, he denies palpitations and tremor.   Past Medical History:  Diagnosis Date   ALLERGIC RHINITIS 05/21/2010   ASTHMA 05/21/2010   Asthma    Factor V Leiden ([place])    Hx of adenomatous colonic polyps    HYPERTENSION 05/21/2010   Diffiult to control   Hyperthyroidism    Inguinal hernia    Ischemic colitis ([place])    Lower GI bleed    Prolonged QT interval    PULMONARY EMBOLISM 03/18/5572   Umbilical hernia     Past Surgical History:  Procedure Laterality Date   ACHILLES TENDON REPAIR     COLONOSCOPY     last 2019, multiple   DOPPLER ECHOCARDIOGRAPHY  12/30/2011   EF >55%; MILD LVH,MILD LAE,NORMAL DIASTOLIC FUNCTION   RENAL DOPPLER  12/30/2011   NORMAL RENAL   UMBILICAL HERNIA REPAIR N/A 01/04/2020   Procedure: UMBILICAL HERNIA REPAIR WITH MESH;  Surgeon: Jovita Kussmaul, MD;  Location: [place];  Service: General;  Laterality: N/A;    Social History   Socioeconomic History   Marital status: Married    Spouse name: Not on file   Number of children: Not on file   Years of education: Not on file   Highest education level: Not on file  Occupational History   Not on file  Tobacco Use   Smoking status: Never   Smokeless tobacco: Never  Vaping Use   Vaping Use: Never used  Substance and Sexual Activity   Alcohol use: Yes    Comment: 2-3 times/week   Drug use: No   Sexual activity: Not on file  Other Topics Concern   Not on file  Social History Narrative   He is a married father of 2. [employer] employee   He does exercise about 3-4 days a week with no true symptoms.    Does not smoke and occasional social alcohol.    Right handed   Social Determinants of Health    Financial Resource Strain: Low Risk    Difficulty of Paying Living Expenses: Not hard at all  Food Insecurity: No Food Insecurity   Worried About Charity fundraiser in the Last Year: Never true   Ran Out of Food in the Last Year: Never true  Transportation Needs: No Transportation Needs   Lack of Transportation (Medical): No   Lack of Transportation (Non-Medical): No  Physical Activity: Sufficiently Active   Days of Exercise per Week: 5 days   Minutes of Exercise per Session: 60 min  Stress: No Stress Concern Present   Feeling of Stress : Not at all  Social Connections: Moderately Isolated   Frequency of Communication with Friends and Family: Three times a week   Frequency of Social Gatherings with Friends and Family: Three times a week   Attends Religious Services: Never   Active Member of Clubs or Organizations: No   Attends Archivist Meetings: Never   Marital Status: Married  Human resources officer Violence: Not At Risk   Fear of Current or Ex-Partner: No   Emotionally Abused: No   Physically Abused: No   Sexually Abused: No    Current Outpatient Medications on  File Prior to Visit  Medication Sig Dispense Refill   albuterol (PROVENTIL) (2.5 MG/3ML) 0.083% nebulizer solution Take 3 mLs (2.5 mg total) by nebulization every 6 (six) hours as needed for wheezing or shortness of breath. 75 mL 12   albuterol (VENTOLIN HFA) 108 (90 Base) MCG/ACT inhaler INHALE 2 PUFFS INTO THE LUNGS EVERY 4 HOURS AS NEEDED FOR WHEEZE 18 each 2   amLODipine (NORVASC) 10 MG tablet TAKE 1 TABLET BY MOUTH EVERY DAY 90 tablet 1   Calcium Carbonate-Vitamin D 600-400 MG-UNIT tablet Take 1 tablet by mouth daily.     chlorthalidone (HYGROTON) 50 MG tablet TAKE 1 TABLET BY MOUTH EVERY DAY 90 tablet 1   Cholecalciferol (VITAMIN D-3) 5000 units TABS Take 5,000 Units by mouth daily.     FLOVENT HFA 110 MCG/ACT inhaler INHALE 2 PUFFS BY MOUTH TWICE A DAY. RINSE MOUTH AFTER USE 36 each 0   fluticasone  (FLONASE) 50 MCG/ACT nasal spray PLACE 1 SPRAY INTO BOTH NOSTRILS DAILY. 48 mL 1   Multiple Vitamin (MULTIVITAMIN) tablet Take 1 tablet by mouth daily.     Omega-3 Fatty Acids (FISH OIL) 1000 MG CAPS Take by mouth 2 (two) times daily.     primidone (MYSOLINE) 50 MG tablet TAKE 1 TABLET (50 MG TOTAL) BY MOUTH IN THE MORNING AND AT BEDTIME. 180 tablet 0   ramipril (ALTACE) 10 MG capsule Take 1 capsule (10 mg total) by mouth daily. Must keep upcoming appointment for future refills 90 capsule 0   rosuvastatin (CRESTOR) 5 MG tablet TAKE 1 TABLET BY MOUTH EVERY DAY 90 tablet 0   SAVAYSA 60 MG TABS tablet TAKE 1 TABLET BY MOUTH EVERY DAY 90 tablet 3   Spacer/Aero-Holding Chambers (E-Z SPACER) inhaler Use as instructed 1 each 2   No current facility-administered medications on file prior to visit.    Allergies  Allergen Reactions   Celebrex [Celecoxib]     Blood in stool   Other     Animal dander causes sneezing and watery eyes   Neosporin [Bacitracin-Polymyxin B] Rash    Family History  Problem Relation Age of Onset   Kidney failure Father        single kidney; hx of recurrent infections   Renal cancer Father    Pancreatic cancer Brother    Hypertension Mother    Hypothyroidism Mother    Hypothyroidism Sister    Heart disease Neg Hx    Colon cancer Neg Hx    Esophageal cancer Neg Hx    Stomach cancer Neg Hx    Rectal cancer Neg Hx     BP (!) 142/80 (BP Location: Left Arm, Patient Position: Sitting, Cuff Size: Normal)    Pulse 79    Ht 6\' 1"  (1.854 m)    Wt 229 lb 6.4 oz (104.1 kg)    SpO2 96%    BMI 30.27 kg/m    Review of Systems Denies fever    Objective:   Physical Exam VITAL SIGNS:  See vs page GENERAL: no distress NECK: There is no palpable thyroid enlargement.  No thyroid nodule is palpable.  No palpable lymphadenopathy at the anterior neck.   Lab Results  Component Value Date   TSH 3.22 12/01/2021      Assessment & Plan:  Hypothyroidism:  well-controlled.  Patient Instructions  Please continue the same methimazole.   If ever you have fever while taking methimazole, stop it and call us, even if the reason is obvious, because of the risk of  a rare side-effect.   It is best to never miss the medication.  However, if you do miss it, next best is to double up the next time.   I would be happy to see you back here as needed.

## 2022-01-08 NOTE — Telephone Encounter (Signed)
We received this message from insurance on 01/06/22 that although not on his formulary, they approved the medication. Since approval window started 12/27/21, he should be able to get medication. Please communicate this with the pharmacy if he has not been able to get the medication yet.    As a member of [employer] ([employer]), we are pleased to inform you that, upon review of the information provided by you or your doctor, we have approved the requested  coverage for the following prescription drug(s): SAVAYSA Tablet Type of coverage approved: Non-Formulary This approval authorizes your coverage from 12/27/2021 - 12/26/2022, unless we notify you otherwise, and as long as the following conditions apply: you remain enrolled in our Medicare Part D prescription drug plan,your physician or other prescriber continues to prescribe the medication for you, and the medication continues to be safe for treating your condition.

## 2022-01-14 ENCOUNTER — Other Ambulatory Visit: Payer: Self-pay | Admitting: Family Medicine

## 2022-01-14 ENCOUNTER — Other Ambulatory Visit: Payer: Self-pay | Admitting: Neurology

## 2022-01-14 DIAGNOSIS — G250 Essential tremor: Secondary | ICD-10-CM

## 2022-02-02 ENCOUNTER — Other Ambulatory Visit: Payer: Self-pay | Admitting: Neurology

## 2022-02-02 ENCOUNTER — Ambulatory Visit: Payer: Medicare HMO | Admitting: Cardiology

## 2022-02-02 ENCOUNTER — Encounter: Payer: Self-pay | Admitting: Cardiology

## 2022-02-02 ENCOUNTER — Other Ambulatory Visit: Payer: Self-pay | Admitting: Cardiology

## 2022-02-02 ENCOUNTER — Other Ambulatory Visit: Payer: Self-pay

## 2022-02-02 VITALS — BP 138/72 | HR 69 | Ht 74.0 in | Wt 229.2 lb

## 2022-02-02 DIAGNOSIS — I82402 Acute embolism and thrombosis of unspecified deep veins of left lower extremity: Secondary | ICD-10-CM | POA: Diagnosis not present

## 2022-02-02 DIAGNOSIS — I10 Essential (primary) hypertension: Secondary | ICD-10-CM | POA: Diagnosis not present

## 2022-02-02 DIAGNOSIS — R002 Palpitations: Secondary | ICD-10-CM

## 2022-02-02 DIAGNOSIS — E785 Hyperlipidemia, unspecified: Secondary | ICD-10-CM

## 2022-02-02 DIAGNOSIS — E0500 Thyrotoxicosis with diffuse goiter without thyrotoxic crisis or storm: Secondary | ICD-10-CM | POA: Diagnosis not present

## 2022-02-02 DIAGNOSIS — G250 Essential tremor: Secondary | ICD-10-CM

## 2022-02-02 NOTE — Assessment & Plan Note (Signed)
Well-controlled lipids on low-dose rosuvastatin.

## 2022-02-02 NOTE — Progress Notes (Signed)
Primary Care Provider: Caren Macadam, MD Cardiologist: None Electrophysiologist: None  Clinic Note: Chief Complaint  Patient presents with   Follow-up    42-month   Palpitations    Well-controlled.  No more issues since treatment of thyroid disease    ===================================  ASSESSMENT/PLAN   Problem List Items Addressed This Visit       Cardiology Problems   Essential hypertension (Chronic)    Borderline blood pressure today, but appears to been pretty well controlled at home.  Is on high-dose chlorthalidone and amlodipine as well as ramipril.  Otherwise doing pretty well.  Home blood pressure stable.      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  Switched to [facility] because of interaction with Eliquis or Xarelto with primidone.  No bleeding issues.      Hyperlipidemia with target LDL less than 100 (Chronic)    Well-controlled lipids on low-dose rosuvastatin.        Other   Palpitations - Primary (Chronic)    Almost nonexistent now that thyroid levels have stabilized out.      Relevant Orders   EKG 12-Lead   Graves disease (Chronic)    Now controlled on on methimazole.  With control of thyroid disease, palpitations notably improved.      ===================================  HPI:    Matthew Trujillo is a 71 y.o. male with a PMH notable for HTN, HLD, venous stasis edema, Factor V Leiden (history of PE in 2007-on Savaysa -switched b/c Eliquis / Xarelto interferes with Thyroid Rx), and palpitations related to hyperthyroidism.  He presents presents today for essentially 59-month follow-up.  ARIZ TERRONES was last seen on 05/26/2020 -> noted that his palpitations were notably improved, no longer on beta-blocker.  Treatment of the thyroid levels significant he had an improvement.  Still on methimazole for hyperthyroidism.  No  chest pain or pressure.  Had some cough from his allergies but otherwise no real dyspnea.  Remains on Eliquis with no GI bleeds.  Not on beta-blocker.  On combination of amlodipine, chlorthalidone on an ACE inhibitor.  Recent Hospitalizations: None  Reviewed  CV studies:    The following studies were reviewed today: (if available, images/films reviewed: From Epic Chart or Care Everywhere) None:   Interval History:   Matthew Trujillo returns here today for what amounts to be and 18+ month follow-up doing pretty well.  He has not had any further palpitations since having his thyroid treated.  He denies any heart failure symptoms of PND, orthopnea or edema.  No chest pain or pressure. His blood pressures at home are usually better than they are here.  This morning was 130/70.  CV Review of Symptoms (Summary) Cardiovascular ROS: no chest pain or dyspnea on exertion negative for - edema, irregular heartbeat, orthopnea, palpitations, paroxysmal nocturnal dyspnea, rapid heart rate, shortness of breath, or -Syncope/near syncope or TIA/amaurosis fugax, claudication  REVIEWED OF SYSTEMS   Review of Systems  Constitutional:  Negative for malaise/fatigue and weight loss.  HENT:  Negative for congestion.   Respiratory:  Negative for cough and shortness of breath.   Gastrointestinal:  Negative for blood in stool and melena.  Genitourinary:  Negative for dysuria and hematuria.  Musculoskeletal:  Negative for joint pain.  Neurological:  Negative for dizziness and headaches.  Psychiatric/Behavioral:  Negative for memory loss. The patient does not have insomnia.  I have reviewed and (if needed) personally updated the patient's problem list, medications, allergies, past medical and surgical history, social and family history.   PAST MEDICAL HISTORY   Past Medical History:  Diagnosis Date   ALLERGIC RHINITIS 05/21/2010   ASTHMA 05/21/2010   Asthma    Factor V Leiden ([place])    Hx of adenomatous  colonic polyps    HYPERTENSION 05/21/2010   Diffiult to control   Hyperthyroidism    Inguinal hernia    Ischemic colitis ([place])    Lower GI bleed    Prolonged QT interval    PULMONARY EMBOLISM 09/16/1940   Umbilical hernia     PAST SURGICAL HISTORY   Past Surgical History:  Procedure Laterality Date   ACHILLES TENDON REPAIR     COLONOSCOPY     last 2019, multiple   DOPPLER ECHOCARDIOGRAPHY  12/30/2011   EF >55%; MILD LVH,MILD LAE,NORMAL DIASTOLIC FUNCTION   RENAL DOPPLER  12/30/2011   NORMAL RENAL   UMBILICAL HERNIA REPAIR N/A 01/04/2020   Procedure: UMBILICAL HERNIA REPAIR WITH MESH;  Surgeon: Jovita Kussmaul, MD;  Location: [facility];  Service: General;  Laterality: N/A;    Immunization History  Administered Date(s) Administered   Fluad Quad(high Dose 65+) 08/22/2019   Influenza Whole 09/29/2010   Influenza, High Dose Seasonal PF 10/05/2018   Influenza-Unspecified 10/12/2017, 08/27/2020, 10/26/2021   PFIZER(Purple Top)SARS-COV-2 Vaccination 01/28/2020, 02/17/2020, 09/26/2020, 06/25/2021   PNEUMOCOCCAL CONJUGATE-20 11/30/2021   Pfizer Covid-19 Vaccine Bivalent Booster 87yrs & up 12/09/2021   Pneumococcal Conjugate-13 08/16/2018   Pneumococcal Polysaccharide-23 08/22/2019   Tdap 09/15/2019   Zoster Recombinat (Shingrix) 08/24/2018, 03/12/2019    MEDICATIONS/ALLERGIES   Current Meds  Medication Sig   albuterol (PROVENTIL) (2.5 MG/3ML) 0.083% nebulizer solution Take 3 mLs (2.5 mg total) by nebulization every 6 (six) hours as needed for wheezing or shortness of breath.   albuterol (VENTOLIN HFA) 108 (90 Base) MCG/ACT inhaler INHALE 2 PUFFS INTO THE LUNGS EVERY 4 HOURS AS NEEDED FOR WHEEZE   Calcium Carbonate-Vitamin D 600-400 MG-UNIT tablet Take 1 tablet by mouth daily.   chlorthalidone (HYGROTON) 50 MG tablet TAKE 1 TABLET BY MOUTH EVERY DAY   Cholecalciferol (VITAMIN D-3) 5000 units TABS Take 5,000 Units by mouth daily.   FLOVENT HFA 110 MCG/ACT inhaler  INHALE 2 PUFFS BY MOUTH TWICE A DAY. RINSE MOUTH AFTER USE   fluticasone (FLONASE) 50 MCG/ACT nasal spray PLACE 1 SPRAY INTO BOTH NOSTRILS DAILY.   methimazole (TAPAZOLE) 5 MG tablet Take 1 tablet (5 mg total) by mouth daily.   Multiple Vitamin (MULTIVITAMIN) tablet Take 1 tablet by mouth daily.   Omega-3 Fatty Acids (FISH OIL) 1000 MG CAPS Take by mouth 2 (two) times daily.   primidone (MYSOLINE) 50 MG tablet TAKE 1 TABLET (50 MG TOTAL) BY MOUTH IN THE MORNING AND AT BEDTIME.   rosuvastatin (CRESTOR) 5 MG tablet TAKE 1 TABLET BY MOUTH EVERY DAY   SAVAYSA 60 MG TABS tablet TAKE 1 TABLET BY MOUTH EVERY DAY   Spacer/Aero-Holding Chambers (E-Z SPACER) inhaler Use as instructed   [DISCONTINUED] amLODipine (NORVASC) 10 MG tablet TAKE 1 TABLET BY MOUTH EVERY DAY   [DISCONTINUED] ramipril (ALTACE) 10 MG capsule Take 1 capsule (10 mg total) by mouth daily. Must keep upcoming appointment for future refills    Allergies  Allergen Reactions   Celebrex [Celecoxib]     Blood in stool   Other     Animal dander causes sneezing and watery eyes   Neosporin [  Bacitracin-Polymyxin B] Rash    SOCIAL HISTORY/FAMILY HISTORY   Reviewed in Epic:  Pertinent findings:  Social History   Tobacco Use   Smoking status: Never   Smokeless tobacco: Never  Vaping Use   Vaping Use: Never used  Substance Use Topics   Alcohol use: Yes    Comment: 2-3 times/week   Drug use: No   Social History   Social History Narrative   He is a married father of 2. [employer] employee   He does exercise about 3-4 days a week with no true symptoms.    Does not smoke and occasional social alcohol.    Right handed    OBJCTIVE -PE, EKG, labs   Wt Readings from Last 3 Encounters:  02/02/22 229 lb 3.2 oz (104 kg)  01/07/22 229 lb 6.4 oz (104.1 kg)  11/30/21 225 lb 11.2 oz (102.4 kg)    Physical Exam: BP 138/72    Pulse 69    Ht 6\' 2"  (1.88 m)    Wt 229 lb 3.2 oz (104 kg)    SpO2 97%    BMI 29.43 kg/m  Physical  Exam Vitals reviewed.  Constitutional:      General: He is not in acute distress.    Appearance: Normal appearance. He is obese. He is not toxic-appearing.     Comments: Well-nourished, well-groomed.  HENT:     Head: Normocephalic and atraumatic.  Neck:     Vascular: No carotid bruit or JVD.  Cardiovascular:     Rate and Rhythm: Normal rate and regular rhythm. No extrasystoles are present.    Chest Wall: PMI is not displaced.     Pulses: Normal pulses.     Heart sounds: Normal heart sounds, S1 normal and S2 normal. No murmur heard.   No friction rub. No gallop.  Pulmonary:     Effort: Pulmonary effort is normal. No respiratory distress.     Breath sounds: Normal breath sounds. No wheezing, rhonchi or rales.  Chest:     Chest wall: No tenderness.  Musculoskeletal:        General: No swelling. Normal range of motion.     Cervical back: Normal range of motion and neck supple.  Skin:    General: Skin is warm and dry.  Neurological:     General: No focal deficit present.     Mental Status: He is alert and oriented to person, place, and time.     Gait: Gait normal.  Psychiatric:        Mood and Affect: Mood normal.        Behavior: Behavior normal.        Thought Content: Thought content normal.        Judgment: Judgment normal.     Adult ECG Report  Rate: 69 ;  Rhythm: normal sinus rhythm and -Nonspecific ST-T wave changes.,  Intervals durations.  Otherwise normal axis ;   Narrative Interpretation: Stable  Recent Labs: Reviewed.Due for labs to be rechecked. Lab Results  Component Value Date   CHOL 134 12/01/2021   HDL 45.90 12/01/2021   LDLCALC 66 12/01/2021   TRIG 111.0 12/01/2021   CHOLHDL 3 12/01/2021   Lab Results  Component Value Date   CREATININE 0.99 12/01/2021   BUN 22 12/01/2021   NA 138 12/01/2021   K 3.6 12/01/2021   CL 101 12/01/2021   CO2 30 12/01/2021   CBC Latest Ref Rng & Units 12/01/2021 11/24/2020 04/29/2020  WBC 4.0 - 10.5 K/uL 9.8  9.2 9.3   Hemoglobin 13.0 - 17.0 g/dL 15.5 16.6 15.6  Hematocrit 39.0 - 52.0 % 45.8 49.2 45.9  Platelets 150.0 - 400.0 K/uL 308.0 329 328.0    Lab Results  Component Value Date   HGBA1C 5.5 08/08/2019   Lab Results  Component Value Date   TSH 3.22 12/01/2021    ==================================================  COVID-19 Education: The signs and symptoms of COVID-19 were discussed with the patient and how to seek care for testing (follow up with PCP or arrange E-visit).    I spent a total of 19  minutes with the patient spent in direct patient consultation.  Additional time spent with chart review  / charting (studies, outside notes, etc): 14 min Total Time: 33 min  Current medicines are reviewed at length with the patient today.  (+/- concerns) none  This visit occurred during the SARS-CoV-2 public health emergency.  Safety protocols were in place, including screening questions prior to the visit, additional usage of staff PPE, and extensive cleaning of exam room while observing appropriate contact time as indicated for disinfecting solutions.  Notice: This dictation was prepared with Dragon dictation along with smart phrase technology. Any transcriptional errors that result from this process are unintentional and may not be corrected upon review.  Studies Ordered:   Orders Placed This Encounter  Procedures   EKG 12-Lead    Patient Instructions / Medication Changes & Studies & Tests Ordered   Patient Instructions  Medication Instructions:   No changes  *If you need a refill on your cardiac medications before your next appointment, please call your pharmacy*   Lab Work: Not needed    Testing/Procedures:  Not needed  Follow-Up: At [facility], you and your health needs are our priority.  As part of our continuing mission to provide you with exceptional heart care, we have created designated Provider Care Teams.  These Care Teams include your primary Cardiologist  (physician) and Advanced Practice Providers (APPs -  Physician Assistants and Nurse Practitioners) who all work together to provide you with the care you need, when you need it.     Your next appointment:   24 month(s)  The format for your next appointment:   In Person  Provider:   None         Glenetta Hew, M.D., M.S. Interventional Cardiologist   Pager # [phone] Phone # [phone] [address] [place], [place] [address]   Thank you for choosing Heartcare at [facility]!!

## 2022-02-02 NOTE — Assessment & Plan Note (Signed)
Now controlled on on methimazole.  With control of thyroid disease, palpitations notably improved.

## 2022-02-02 NOTE — Patient Instructions (Signed)
Medication Instructions:   No changes  *If you need a refill on your cardiac medications before your next appointment, please call your pharmacy*   Lab Work: Not needed    Testing/Procedures:  Not needed  Follow-Up: At [facility], you and your health needs are our priority.  As part of our continuing mission to provide you with exceptional heart care, we have created designated Provider Care Teams.  These Care Teams include your primary Cardiologist (physician) and Advanced Practice Providers (APPs -  Physician Assistants and Nurse Practitioners) who all work together to provide you with the care you need, when you need it.     Your next appointment:   24 month(s)  The format for your next appointment:   In Person  Provider:   None

## 2022-02-02 NOTE — Assessment & Plan Note (Signed)
Borderline blood pressure today, but appears to been pretty well controlled at home.  Is on high-dose chlorthalidone and amlodipine as well as ramipril.  Otherwise doing pretty well.  Home blood pressure stable.

## 2022-02-02 NOTE — Assessment & Plan Note (Signed)
On chronic lifelong anticoagulation.  Switched to [facility] because of interaction with Eliquis or Xarelto with primidone.  No bleeding issues.

## 2022-02-02 NOTE — Assessment & Plan Note (Signed)
Almost nonexistent now that thyroid levels have stabilized out.

## 2022-02-03 ENCOUNTER — Other Ambulatory Visit: Payer: Self-pay

## 2022-02-25 ENCOUNTER — Encounter: Payer: Self-pay | Admitting: Family Medicine

## 2022-02-25 ENCOUNTER — Encounter: Payer: Self-pay | Admitting: Neurology

## 2022-03-01 MED ORDER — EDOXABAN TOSYLATE 60 MG PO TABS: 60.0000 mg | ORAL_TABLET | Freq: Every day | ORAL | 3 refills | Status: DC

## 2022-03-01 NOTE — Telephone Encounter (Signed)
[facility] said they are able to order it but need a prescription and a paid claim prior to ordering because it's very expensive. Can you please send the prescription to them? ?

## 2022-03-03 DIAGNOSIS — L570 Actinic keratosis: Secondary | ICD-10-CM | POA: Diagnosis not present

## 2022-03-03 DIAGNOSIS — D225 Melanocytic nevi of trunk: Secondary | ICD-10-CM | POA: Diagnosis not present

## 2022-03-03 DIAGNOSIS — L814 Other melanin hyperpigmentation: Secondary | ICD-10-CM | POA: Diagnosis not present

## 2022-03-03 DIAGNOSIS — L821 Other seborrheic keratosis: Secondary | ICD-10-CM | POA: Diagnosis not present

## 2022-04-12 ENCOUNTER — Ambulatory Visit (INDEPENDENT_AMBULATORY_CARE_PROVIDER_SITE_OTHER): Payer: Medicare HMO | Admitting: Family Medicine

## 2022-04-12 VITALS — BP 132/78 | HR 73 | Temp 99.2°F | Wt 228.0 lb

## 2022-04-12 DIAGNOSIS — J45909 Unspecified asthma, uncomplicated: Secondary | ICD-10-CM | POA: Insufficient documentation

## 2022-04-12 DIAGNOSIS — J45901 Unspecified asthma with (acute) exacerbation: Secondary | ICD-10-CM

## 2022-04-12 MED ORDER — PREDNISONE 10 MG PO TABS: ORAL_TABLET | ORAL | 0 refills | Status: DC

## 2022-04-12 MED ORDER — ADVAIR HFA 115-21 MCG/ACT IN AERO: 2.0000 | INHALATION_SPRAY | Freq: Two times a day (BID) | RESPIRATORY_TRACT | 12 refills | Status: DC

## 2022-04-12 MED ORDER — IPRATROPIUM-ALBUTEROL 0.5-2.5 (3) MG/3ML IN SOLN
3.0000 mL | Freq: Once | RESPIRATORY_TRACT | Status: AC
  Administered 2022-04-12: 3 mL via RESPIRATORY_TRACT

## 2022-04-12 NOTE — Progress Notes (Signed)
? ?  Subjective:  ? ? Patient ID: Matthew Trujillo, male    DOB: [birth year], 71 y.o.   MRN: [id number] ? ?HPI ?Here for a worsening of his asthma over the past 5 days. He does not fel like he has an infection. No fever. He is wheezing and has a dry cough. His usual maintenance inhaler is Flovent HFA. He has been using his nebulizer at home 3 times a day over the past few days.  ? ? ?Review of Systems  ?Constitutional: Negative.   ?HENT: Negative.    ?Eyes: Negative.   ?Respiratory:  Positive for cough, chest tightness, shortness of breath and wheezing.   ?Cardiovascular: Negative.   ? ?   ?Objective:  ? Physical Exam ?Constitutional:   ?   Appearance: Normal appearance. He is not ill-appearing.  ?Cardiovascular:  ?   Rate and Rhythm: Normal rate and regular rhythm.  ?   Pulses: Normal pulses.  ?   Heart sounds: Normal heart sounds.  ?Pulmonary:  ?   Effort: Pulmonary effort is normal. No respiratory distress.  ?   Comments: Diffuse wheezes and rhonchi at first. After the nebulizer treatment he was cleared with improved air flow  ?Lymphadenopathy:  ?   Cervical: No cervical adenopathy.  ?Neurological:  ?   Mental Status: He is alert.  ? ? ? ? ? ?   ?Assessment & Plan:  ?Asthma exacerbation. He was treated with a Duoneb nebulizer treatment today and he responded well. We will stop the maintenance Flovent inhaler and switch to Advair HFA 115-21 BID. Given 4 days of Prednisone to take 40 mg a day. We spent a total of (33   ) minutes reviewing records and discussing these issues.  ?Alysia Penna, MD ? ? ?

## 2022-04-12 NOTE — Addendum Note (Signed)
Addended by: Wyvonne Lenz on: 04/12/2022 03:47 PM ? ? Modules accepted: Orders ? ?

## 2022-04-16 ENCOUNTER — Encounter: Payer: Self-pay | Admitting: Family Medicine

## 2022-04-16 DIAGNOSIS — J4541 Moderate persistent asthma with (acute) exacerbation: Secondary | ICD-10-CM | POA: Diagnosis not present

## 2022-04-19 ENCOUNTER — Other Ambulatory Visit: Payer: Self-pay

## 2022-04-19 DIAGNOSIS — J309 Allergic rhinitis, unspecified: Secondary | ICD-10-CM

## 2022-04-19 MED ORDER — AZITHROMYCIN 250 MG PO TABS: ORAL_TABLET | ORAL | 0 refills | Status: AC

## 2022-04-19 NOTE — Telephone Encounter (Signed)
Please call in a Zpack  °

## 2022-04-22 ENCOUNTER — Other Ambulatory Visit: Payer: Self-pay | Admitting: Cardiology

## 2022-05-14 NOTE — Progress Notes (Signed)
Assessment/Plan:    1.  Essential Tremor   -Continue primidone, 50 mg twice daily  2.  Hyperthyroidism  -Follows with Dr. Loanne Drilling.  Reportedly well controlled with methimazole.  3.  Factor V Leiden  -Currently on savaysa.  -If he has to return to Eliquis because of inability to obtain savaysa, he knows he will need to be off of primidone.  He also knows that his other options for tremor control are very limited.  He has already been on topiramate.  He cannot take a beta-blocker because of his asthma.  Gabapentin could be an option, although my experience has been that has been a very limited option for control.  Certainly, there are anticholinergics such as trihexyphenidyl, but those are not ideal in this age group.  4.  F/u 1 year  Subjective:   Matthew Trujillo was seen today in follow up for essential tremor.  My previous records were reviewed prior to todays visit.  Patient emailed me in March stating that he was having a hard time getting a hold of savaysa, which was the only way that he was able to take primidone.  Tremor is controlled but he still notes tremor - "I can still feed my grandson."  Current prescribed movement disorder medications: Primidone, 50 mg twice per day    PREVIOUS MEDICATIONS: Metoprolol (taken off in the past because of asthma); topiramate (cognitive change and depression)  ALLERGIES:   Allergies  Allergen Reactions   Celebrex [Celecoxib]     Blood in stool   Other     Animal dander causes sneezing and watery eyes   Neosporin [Bacitracin-Polymyxin B] Rash    CURRENT MEDICATIONS:  Outpatient Encounter Medications as of 05/18/2022  Medication Sig   albuterol (PROVENTIL) (2.5 MG/3ML) 0.083% nebulizer solution Take 3 mLs (2.5 mg total) by nebulization every 6 (six) hours as needed for wheezing or shortness of breath.   albuterol (VENTOLIN HFA) 108 (90 Base) MCG/ACT inhaler INHALE 2 PUFFS INTO THE LUNGS EVERY 4 HOURS AS NEEDED FOR WHEEZE    amLODipine (NORVASC) 10 MG tablet TAKE 1 TABLET BY MOUTH EVERY DAY   Calcium Carbonate-Vitamin D 600-400 MG-UNIT tablet Take 1 tablet by mouth daily.   chlorthalidone (HYGROTON) 50 MG tablet TAKE 1 TABLET BY MOUTH EVERY DAY   Cholecalciferol (VITAMIN D-3) 5000 units TABS Take 5,000 Units by mouth daily.   edoxaban (SAVAYSA) 60 MG TABS tablet Take 60 mg by mouth daily.   fluticasone-salmeterol (ADVAIR HFA) 115-21 MCG/ACT inhaler Inhale 2 puffs into the lungs 2 (two) times daily.   methimazole (TAPAZOLE) 5 MG tablet Take 1 tablet (5 mg total) by mouth daily.   Multiple Vitamin (MULTIVITAMIN) tablet Take 1 tablet by mouth daily.   Omega-3 Fatty Acids (FISH OIL) 1000 MG CAPS Take by mouth 2 (two) times daily.   primidone (MYSOLINE) 50 MG tablet TAKE 1 TABLET (50 MG TOTAL) BY MOUTH IN THE MORNING AND AT BEDTIME.   ramipril (ALTACE) 10 MG capsule Take 1 capsule (10 mg total) by mouth daily.   rosuvastatin (CRESTOR) 5 MG tablet TAKE 1 TABLET BY MOUTH EVERY DAY   Spacer/Aero-Holding Chambers (E-Z SPACER) inhaler Use as instructed   fluticasone (FLONASE) 50 MCG/ACT nasal spray PLACE 1 SPRAY INTO BOTH NOSTRILS DAILY. (Patient not taking: Reported on 05/18/2022)   [DISCONTINUED] predniSONE (DELTASONE) 10 MG tablet Take 4 tablets a day for 4 days, then stop (Patient not taking: Reported on 05/18/2022)   No facility-administered encounter medications on file as  of 05/18/2022.     Objective:    PHYSICAL EXAMINATION:    VITALS:   Vitals:   05/18/22 0810  Weight: 227 lb (103 kg)  Height: '6\' 2"'$  (1.88 m)    GEN:  The patient appears stated age and is in NAD. HEENT:  Normocephalic, atraumatic.  The mucous membranes are moist. The superficial temporal arteries are without ropiness or tenderness. CV:  RRR Lungs:  CTAB Neck/HEME:  There are no carotid bruits bilaterally.  Neurological examination:  Orientation: The patient is alert and oriented x3. Cranial nerves: There is good facial symmetry. The  speech is fluent and clear. Soft palate rises symmetrically and there is no tongue deviation. Hearing is intact to conversational tone. Sensation: Sensation is intact to light touch throughout Motor: Strength is at least antigravity x4.  Movement examination: Tone: There is normal tone in the UE/LE Abnormal movements: no rest tremor.  Mild postural tremor, R>L.  Archimedes spirals much improved. Coordination:  There is no decremation with RAM's Gait and Station: The patient has no difficulty arising out of a deep-seated chair without the use of the hands. The patient's stride length is good I have reviewed and interpreted the following labs independently   Chemistry      Component Value Date/Time   NA 138 12/01/2021 0756   K 3.6 12/01/2021 0756   CL 101 12/01/2021 0756   CO2 30 12/01/2021 0756   BUN 22 12/01/2021 0756   CREATININE 0.99 12/01/2021 0756   CREATININE 0.91 11/24/2020 0932      Component Value Date/Time   CALCIUM 9.7 12/01/2021 0756   ALKPHOS 52 12/01/2021 0756   AST 25 12/01/2021 0756   ALT 37 12/01/2021 0756   BILITOT 0.9 12/01/2021 0756      Lab Results  Component Value Date   WBC 9.8 12/01/2021   HGB 15.5 12/01/2021   HCT 45.8 12/01/2021   MCV 90.8 12/01/2021   PLT 308.0 12/01/2021   Lab Results  Component Value Date   TSH 3.22 12/01/2021     Chemistry      Component Value Date/Time   NA 138 12/01/2021 0756   K 3.6 12/01/2021 0756   CL 101 12/01/2021 0756   CO2 30 12/01/2021 0756   BUN 22 12/01/2021 0756   CREATININE 0.99 12/01/2021 0756   CREATININE 0.91 11/24/2020 0932      Component Value Date/Time   CALCIUM 9.7 12/01/2021 0756   ALKPHOS 52 12/01/2021 0756   AST 25 12/01/2021 0756   ALT 37 12/01/2021 0756   BILITOT 0.9 12/01/2021 0756      Total time spent on today's visit was 20 minutes, including both face-to-face time and nonface-to-face time.  Time included that spent on review of records (prior notes available to me/labs/imaging if  pertinent), discussing treatment and goals, answering patient's questions and coordinating care.  Cc:  Caren Macadam, MD

## 2022-05-18 ENCOUNTER — Encounter: Payer: Self-pay | Admitting: Neurology

## 2022-05-18 ENCOUNTER — Ambulatory Visit: Payer: Medicare HMO | Admitting: Neurology

## 2022-05-18 DIAGNOSIS — G250 Essential tremor: Secondary | ICD-10-CM | POA: Diagnosis not present

## 2022-05-18 MED ORDER — PRIMIDONE 50 MG PO TABS: 50.0000 mg | ORAL_TABLET | Freq: Two times a day (BID) | ORAL | 2 refills | Status: DC

## 2022-06-16 ENCOUNTER — Encounter: Payer: Self-pay | Admitting: Family

## 2022-06-16 ENCOUNTER — Ambulatory Visit (INDEPENDENT_AMBULATORY_CARE_PROVIDER_SITE_OTHER): Payer: Medicare HMO | Admitting: Family

## 2022-06-16 VITALS — BP 120/80 | HR 72 | Temp 97.7°F | Wt 228.9 lb

## 2022-06-16 DIAGNOSIS — R051 Acute cough: Secondary | ICD-10-CM | POA: Diagnosis not present

## 2022-06-16 DIAGNOSIS — J019 Acute sinusitis, unspecified: Secondary | ICD-10-CM

## 2022-06-16 DIAGNOSIS — B9689 Other specified bacterial agents as the cause of diseases classified elsewhere: Secondary | ICD-10-CM | POA: Diagnosis not present

## 2022-06-16 MED ORDER — AMOXICILLIN-POT CLAVULANATE 875-125 MG PO TABS: 1.0000 | ORAL_TABLET | Freq: Two times a day (BID) | ORAL | 0 refills | Status: DC

## 2022-06-16 MED ORDER — METHYLPREDNISOLONE 4 MG PO TBPK: ORAL_TABLET | ORAL | 0 refills | Status: DC

## 2022-06-16 NOTE — Progress Notes (Signed)
   Acute Office Visit  Subjective:     Patient ID: Matthew Trujillo, male    DOB: 03-19-1951, 71 y.o.   MRN: [id number]  Chief Complaint  Patient presents with  . Cough  . Sinusitis    HPI Patient is in today with c/o sinus pressure, congestion, cough, dark green sputum that has been ongoing x 1 month. Has been taking Mucinex, Tussin, Zyrtec, Tylenol without much relief of his symptoms. Grandchildren have been sick off and on.   Review of Systems  Constitutional:  Negative for chills and weight loss.  HENT:  Positive for congestion and sinus pain.   Respiratory:  Positive for cough.   Cardiovascular: Negative.   Gastrointestinal: Negative.   Musculoskeletal: Negative.   Skin: Negative.   Neurological: Negative.   Psychiatric/Behavioral: Negative.    All other systems reviewed and are negative.      Objective:    BP 120/80 (BP Location: Left Arm, Patient Position: Sitting, Cuff Size: Large)   Pulse 72   Temp 97.7 F (36.5 C) (Oral)   Wt 228 lb 14.4 oz (103.8 kg)   SpO2 99%   BMI 29.39 kg/m    Physical Exam Vitals and nursing note reviewed.  Constitutional:      Appearance: Normal appearance.  HENT:     Right Ear: Tympanic membrane, ear canal and external ear normal.     Left Ear: Tympanic membrane, ear canal and external ear normal.     Nose: Congestion present.     Mouth/Throat:     Mouth: Mucous membranes are moist.     Pharynx: Oropharynx is clear.  Cardiovascular:     Rate and Rhythm: Normal rate and regular rhythm.  Pulmonary:     Effort: Pulmonary effort is normal.     Breath sounds: Normal breath sounds.  Musculoskeletal:     Cervical back: Normal range of motion and neck supple.  Skin:    General: Skin is warm and dry.  Neurological:     General: No focal deficit present.     Mental Status: He is oriented to person, place, and time.  Psychiatric:        Mood and Affect: Mood normal.        Behavior: Behavior normal.   No results found for any  visits on 06/16/22.      Assessment & Plan:   Problem List Items Addressed This Visit   None Visit Diagnoses     Acute bacterial sinusitis    -  Primary   Relevant Medications   methylPREDNISolone (MEDROL DOSEPAK) 4 MG TBPK tablet   amoxicillin-clavulanate (AUGMENTIN) 875-125 MG tablet   Acute cough           Meds ordered this encounter  Medications  . methylPREDNISolone (MEDROL DOSEPAK) 4 MG TBPK tablet    Sig: As directed    Dispense:  21 tablet    Refill:  0  . amoxicillin-clavulanate (AUGMENTIN) 875-125 MG tablet    Sig: Take 1 tablet by mouth 2 (two) times daily.    Dispense:  14 tablet    Refill:  0    Call the office if symptoms worsen or persist. Recheck as scheduled and sooner as needed  Kennyth Arnold, FNP

## 2022-07-12 ENCOUNTER — Other Ambulatory Visit: Payer: Self-pay | Admitting: *Deleted

## 2022-07-12 MED ORDER — ROSUVASTATIN CALCIUM 5 MG PO TABS: 5.0000 mg | ORAL_TABLET | Freq: Every day | ORAL | 1 refills | Status: DC

## 2022-07-14 ENCOUNTER — Ambulatory Visit (INDEPENDENT_AMBULATORY_CARE_PROVIDER_SITE_OTHER): Payer: Medicare HMO

## 2022-07-14 VITALS — BP 128/72 | HR 80 | Temp 97.7°F | Ht 74.0 in | Wt 228.8 lb

## 2022-07-14 DIAGNOSIS — Z0000 Encounter for general adult medical examination without abnormal findings: Secondary | ICD-10-CM

## 2022-07-14 NOTE — Progress Notes (Signed)
Subjective:   Matthew Trujillo is a 71 y.o. male who presents for Medicare Annual/Subsequent preventive examination.  Review of Systems     Cardiac Risk Factors include: advanced age (>35mn, >>56women);hypertension;male gender     Objective:    Today's Vitals   07/14/22 0947  BP: 128/72  Pulse: 80  Temp: 97.7 F (36.5 C)  TempSrc: Oral  SpO2: 98%  Weight: 228 lb 12.8 oz (103.8 kg)  Height: '6\' 2"'$  (1.88 m)   Body mass index is 29.38 kg/m.     07/14/2022   10:04 AM 05/18/2022    8:11 AM 07/10/2021   11:22 AM 02/18/2021    8:12 AM 12/08/2020    8:57 AM 01/04/2020    8:11 AM 12/27/2019    3:07 PM  Advanced Directives  Does Patient Have a Medical Advance Directive? Yes Yes Yes Yes Yes Yes Yes  Type of AParamedicof [place]room[place]Living will [place] will;Out of facility DNR (pink MOST or yellow form) HWestfirLiving will HRiceLiving will  HWoxallLiving will Living will;Healthcare Power of Attorney  Does patient want to make changes to medical advance directive? No - Patient declined     No - Patient declined No - Patient declined  Copy of [place] Chart? No - copy requested  No - copy requested   No - copy requested No - copy requested    Current Medications (verified) Outpatient Encounter Medications as of 07/14/2022  Medication Sig   albuterol (PROVENTIL) (2.5 MG/3ML) 0.083% nebulizer solution Take 3 mLs (2.5 mg total) by nebulization every 6 (six) hours as needed for wheezing or shortness of breath.   albuterol (VENTOLIN HFA) 108 (90 Base) MCG/ACT inhaler INHALE 2 PUFFS INTO THE LUNGS EVERY 4 HOURS AS NEEDED FOR WHEEZE   amLODipine (NORVASC) 10 MG tablet TAKE 1 TABLET BY MOUTH EVERY DAY   Calcium Carbonate-Vitamin D 600-400 MG-UNIT tablet Take 1 tablet by mouth daily.   chlorthalidone (HYGROTON) 50 MG tablet TAKE 1 TABLET BY MOUTH EVERY DAY    Cholecalciferol (VITAMIN D-3) 5000 units TABS Take 5,000 Units by mouth daily.   edoxaban (SAVAYSA) 60 MG TABS tablet Take 60 mg by mouth daily.   fluticasone-salmeterol (ADVAIR HFA) 115-21 MCG/ACT inhaler Inhale 2 puffs into the lungs 2 (two) times daily.   methimazole (TAPAZOLE) 5 MG tablet Take 1 tablet (5 mg total) by mouth daily.   methylPREDNISolone (MEDROL DOSEPAK) 4 MG TBPK tablet As directed   Multiple Vitamin (MULTIVITAMIN) tablet Take 1 tablet by mouth daily.   Omega-3 Fatty Acids (FISH OIL) 1000 MG CAPS Take by mouth 2 (two) times daily.   primidone (MYSOLINE) 50 MG tablet Take 1 tablet (50 mg total) by mouth in the morning and at bedtime.   ramipril (ALTACE) 10 MG capsule Take 1 capsule (10 mg total) by mouth daily.   rosuvastatin (CRESTOR) 5 MG tablet Take 1 tablet (5 mg total) by mouth daily.   Spacer/Aero-Holding Chambers (E-Z SPACER) inhaler Use as instructed   [DISCONTINUED] amoxicillin-clavulanate (AUGMENTIN) 875-125 MG tablet Take 1 tablet by mouth 2 (two) times daily.   No facility-administered encounter medications on file as of 07/14/2022.    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[place]    Hx of  adenomatous colonic polyps    HYPERTENSION 05/21/2010   Diffiult to control   Hyperthyroidism    Inguinal hernia    Ischemic colitis ([place])    Lower GI bleed    Prolonged QT interval    PULMONARY EMBOLISM 4/81/8563   Umbilical hernia    Past Surgical History:  Procedure Laterality Date   ACHILLES TENDON REPAIR     COLONOSCOPY     last 2019, multiple   DOPPLER ECHOCARDIOGRAPHY  12/30/2011   EF >55%; MILD LVH,MILD LAE,NORMAL DIASTOLIC FUNCTION   RENAL DOPPLER  12/30/2011   NORMAL RENAL   UMBILICAL HERNIA REPAIR N/A 01/04/2020   Procedure: UMBILICAL HERNIA REPAIR WITH MESH;  Surgeon: Jovita Kussmaul, MD;  Location: [facility];  Service: General;  Laterality: N/A;   Family History  Problem Relation Age of Onset   Kidney failure Father        single kidney; hx of recurrent infections   Renal cancer Father    Pancreatic cancer Brother    Hypertension Mother    Hypothyroidism Mother    Hypothyroidism Sister    Heart disease Neg Hx    Colon cancer Neg Hx    Esophageal cancer Neg Hx    Stomach cancer Neg Hx    Rectal cancer Neg Hx    Social History   Socioeconomic History   Marital status: Married    Spouse name: Not on file   Number of children: Not on file   Years of education: Not on file   Highest education level: Doctorate  Occupational History   Not on file  Tobacco Use   Smoking status: Never   Smokeless tobacco: Never  Vaping Use   Vaping Use: Never used  Substance and Sexual Activity   Alcohol use: Yes    Comment: 2-3 times/week   Drug use: No   Sexual activity: Not on file  Other Topics Concern   Not on file  Social History Narrative   He is a married father of 2. [employer] employee   He does exercise about 3-4 days a week with no true symptoms.    Does not smoke and occasional social alcohol.    Right handed   Social Determinants of Health   Financial Resource Strain: Low Risk  (07/14/2022)   Overall Financial Resource Strain (CARDIA)    Difficulty of Paying Living Expenses: Not hard at all  Food Insecurity: No Food Insecurity (07/14/2022)   Hunger Vital Sign    Worried About Running Out of Food in the Last Year: Never true    Ran Out of Food in the Last Year: Never true  Transportation Needs: No Transportation Needs (07/14/2022)   PRAPARE - Hydrologist (Medical): No    Lack of Transportation (Non-Medical): No  Physical Activity: Insufficiently Active (07/14/2022)   Exercise Vital Sign    Days of Exercise per Week: 2 days    Minutes of Exercise per Session: 60 min  Stress: No Stress Concern Present  (07/14/2022)   [place]    Feeling of Stress : Only a little  Social Connections: Moderately Isolated (07/14/2022)   Social Connection and Isolation Panel [NHANES]    Frequency of Communication with Friends and Family: Twice a week    Frequency of Social Gatherings with Friends and Family: Once a week    Attends Religious Services: Never    Marine scientist or Organizations: No  Attends Archivist Meetings: Never    Marital Status: Married    Tobacco Counseling Counseling given: Not Answered   Clinical Intake:  Pre-visit preparation completed: Yes  Pain : No/denies pain     BMI - recorded: 29.13 Nutritional Status: BMI 25 -29 Overweight Nutritional Risks: None Diabetes: No  How often do you need to have someone help you when you read instructions, pamphlets, or other written materials from your doctor or pharmacy?: 1 - Never  Diabetic?  No   Activities of Daily Living    07/14/2022   10:02 AM 07/13/2022   10:58 PM  In your present state of health, do you have any difficulty performing the following activities:  Hearing? 0 0  Vision? 0 0  Difficulty concentrating or making decisions? 0 0  Walking or climbing stairs? 0 0  Dressing or bathing? 0 0  Doing errands, shopping? 0 0  Preparing Food and eating ? N N  Using the Toilet? N N  In the past six months, have you accidently leaked urine? N N  Do you have problems with loss of bowel control? N N  Managing your Medications? N N  Managing your Finances? N N  Housekeeping or managing your Housekeeping? N N    Patient Care Team: Caren Macadam, MD (Inactive) as PCP - General (Family Medicine) Tat, Eustace Quail, DO as Consulting Physician (Neurology)  Indicate any recent Medical Services you may have received from other than [facility] providers in the past year (date may be approximate).     Assessment:   This is a routine wellness  examination for [facility].  Hearing/Vision screen Hearing Screening - Comments:: No difficulty hearing Vision Screening - Comments:: Wears reading glasses.   Dietary issues and exercise activities discussed: Exercise limited by: None identified   Goals Addressed               This Visit's Progress     Increase physical activity (pt-stated)         Depression Screen    07/14/2022   10:00 AM 06/16/2022   11:26 AM 04/12/2022    2:05 PM 11/30/2021    2:30 PM 07/10/2021   11:25 AM 11/24/2020    8:29 AM 08/22/2019   11:35 AM  PHQ 2/9 Scores  PHQ - 2 Score 0 0 0 0 0 0 0  PHQ- 9 Score 0 '2 2 2       '$ Fall Risk    07/14/2022   10:03 AM 07/13/2022   10:58 PM 06/16/2022   11:26 AM 05/18/2022    8:11 AM 04/12/2022    2:04 PM  Fall Risk   Falls in the past year? 0 0 0 0 0  Number falls in past yr: 0  0 0 0  Injury with Fall? 0  0 0 0  Risk for fall due to : No Fall Risks  No Fall Risks  No Fall Risks  Follow up   Falls evaluation completed  Falls evaluation completed    FALL RISK PREVENTION PERTAINING TO THE HOME:  Any stairs in or around the home? Yes  If so, are there any without handrails? No  Home free of loose throw rugs in walkways, pet beds, electrical cords, etc? Yes  Adequate lighting in your home to reduce risk of falls? Yes   ASSISTIVE DEVICES UTILIZED TO PREVENT FALLS:  Life alert? No  Use of a cane, walker or w/c? No  Grab bars in the bathroom? Yes Shower  chair or bench in shower? Yes  Elevated toilet seat or a handicapped toilet? Yes   TIMED UP AND GO:  Was the test performed? Yes .  Length of time to ambulate 10 feet: 10 sec.   Gait steady and fast without use of assistive device  Cognitive Function:        07/14/2022   10:04 AM  6CIT Screen  What Year? 0 points  What month? 0 points  What time? 0 points  Count back from 20 0 points  Months in reverse 0 points  Repeat phrase 0 points  Total Score 0 points    Immunizations Immunization History   Administered Date(s) Administered   Fluad Quad(high Dose 65+) 08/22/2019   Influenza Whole 09/29/2010   Influenza, High Dose Seasonal PF 10/05/2018   Influenza-Unspecified 10/12/2017, 08/27/2020, 10/26/2021   PFIZER(Purple Top)SARS-COV-2 Vaccination 01/28/2020, 02/17/2020, 09/26/2020, 06/25/2021   PNEUMOCOCCAL CONJUGATE-20 11/30/2021   Pfizer Covid-19 Vaccine Bivalent Booster 25yr & up 12/09/2021   Pneumococcal Conjugate-13 08/16/2018   Pneumococcal Polysaccharide-23 08/22/2019   Tdap 09/15/2019   Zoster Recombinat (Shingrix) 08/24/2018, 03/12/2019    TDAP status: Up to date  Flu Vaccine status: Up to date  Pneumococcal vaccine status: Up to date  Covid-19 vaccine status: Completed vaccines  Qualifies for Shingles Vaccine? Yes   Zostavax completed Yes   Shingrix Completed?: Yes  Screening Tests Health Maintenance  Topic Date Due   INFLUENZA VACCINE  07/27/2022   COLONOSCOPY (Pts 45-437yrInsurance coverage will need to be confirmed)  11/21/2023   TETANUS/TDAP  09/14/2029   Pneumonia Vaccine 6560Years old  Completed   COVID-19 Vaccine  Completed   Hepatitis C Screening  Completed   Zoster Vaccines- Shingrix  Completed   HPV VACCINES  Aged Out    Health Maintenance  There are no preventive care reminders to display for this patient.  Colorectal cancer screening: Type of screening: Colonoscopy. Completed 11/21/23. Repeat every 5 years  Lung Cancer Screening: (Low Dose CT Chest recommended if Age 69-80ears, 30 pack-year currently smoking OR have quit w/in 15years.) does not qualify.     Additional Screening:  Hepatitis C Screening: does qualify; Completed 04/25/18  Vision Screening: Recommended annual ophthalmology exams for early detection of glaucoma and other disorders of the eye. Is the patient up to date with their annual eye exam?  No Who is the provider or what is the name of the office in which the patient attends annual eye exams? Patient deferred If  pt is not established with a provider, would they like to be referred to a provider to establish care? No .   Dental Screening: Recommended annual dental exams for proper oral hygiene  Community Resource Referral / Chronic Care Management:  CRR required this visit?  No   CCM required this visit?  No      Plan:     I have personally reviewed and noted the following in the patient's chart:   Medical and social history Use of alcohol, tobacco or illicit drugs  Current medications and supplements including opioid prescriptions. Patient is not currently taking opioid prescriptions. Functional ability and status Nutritional status Physical activity Advanced directives List of other physicians Hospitalizations, surgeries, and ER visits in previous 12 months Vitals Screenings to include cognitive, depression, and falls Referrals and appointments  In addition, I have reviewed and discussed with patient certain preventive protocols, quality metrics, and best practice recommendations. A written personalized care plan for preventive services as well as general preventive health  recommendations were provided to patient.     Criselda Peaches, LPN   7/62/2633   Nurse Notes: None

## 2022-07-14 NOTE — Patient Instructions (Addendum)
Matthew Trujillo , Thank you for taking time to come for your Medicare Wellness Visit. I appreciate your ongoing commitment to your health goals. Please review the following plan we discussed and let me know if I can assist you in the future.   These are the goals we discussed:  Goals       Increase physical activity (pt-stated)      Weight Loss Achieved      Evidence-based guidance:  Review medication that may contribute to weight gain, such as corticosteroid, beta-blocker, tricyclic antidepressant, oral antihyperglycemic; advocate for changes when appropriate.  Perform or refer to registered dietitian to perform comprehensive nutrition assessment that includes disordered-eating behaviors, such as binge-eating, emotional or compulsive eating, grazing.  Counsel patient regarding health risks of obesity and that weight loss goal of 5 to 10 percent of initial weight will improve risk.  Recommend initial weight loss goal of 3 to 5 percent of bodyweight; increase weight-loss goals based on patient success as achieving greater weight loss continues to reduce risk.  Propose a calorie-reduced diet based on the patient's preferences and health status.  Provide ongoing emotional support or cognitive behavioral therapy and dietitian services (individual, group, virtual) over at least 6 months with a minimum of 14 encounters to best facilitate weight loss.  Provide monthly follow-up for 12 months when weight loss goal is met to assist with maintenance of weight loss.  Encourage increased physical activity or exercise based on individual age, risk, and ability up to 200 to 300 minutes per week that includes aerobic and resistance training.  Encourage reduction in sedentary behaviors by replacing them with nonexercise yet active leisure pursuits.  Identify physical barriers, such as change in posture, balance, gait patterns, joint pain, and environmental barriers to activity.  Consider referral to rehabilitation  therapy, especially when mobility or function is impaired due to osteoarthritis and obesity.  Consider referral to weight-loss program that has published evidence of safety and efficacy if on-site intensive intervention is unavailable or patient preference.  Prepare patient for use of pharmacologic therapy as an adjunct to lifestyle changes based on body mass index, patient agreement and presence of risk factors or comorbidities.  Evaluate efficacy of pharmacologic therapy (weight loss) and tolerance to medication periodically.  Engage in shared decision-making regarding referral to bariatric surgeon for consultation and evaluation when weight-loss goal has not been accomplished by behavioral therapy with or without pharmacologic therapy.   Notes:         This is a list of the screening recommended for you and due dates:  Health Maintenance  Topic Date Due   Flu Shot  07/27/2022   Colon Cancer Screening  11/21/2023   Tetanus Vaccine  09/14/2029   Pneumonia Vaccine  Completed   COVID-19 Vaccine  Completed   Hepatitis C Screening: USPSTF Recommendation to screen - Ages 56-79 yo.  Completed   Zoster (Shingles) Vaccine  Completed   HPV Vaccine  Aged Out   Advanced directives: Yes  Conditions/risks identified: None  Next appointment: Follow up in one year for your annual wellness visit.    Preventive Care 22 Years and Older, Male Preventive care refers to lifestyle choices and visits with your health care provider that can promote health and wellness. What does preventive care include? A yearly physical exam. This is also called an annual well check. Dental exams once or twice a year. Routine eye exams. Ask your health care provider how often you should have your eyes checked. Personal lifestyle choices,  including: Daily care of your teeth and gums. Regular physical activity. Eating a healthy diet. Avoiding tobacco and drug use. Limiting alcohol use. Practicing safe sex. Taking  low doses of aspirin every day. Taking vitamin and mineral supplements as recommended by your health care provider. What happens during an annual well check? The services and screenings done by your health care provider during your annual well check will depend on your age, overall health, lifestyle risk factors, and family history of disease. Counseling  Your health care provider may ask you questions about your: Alcohol use. Tobacco use. Drug use. Emotional well-being. Home and relationship well-being. Sexual activity. Eating habits. History of falls. Memory and ability to understand (cognition). Work and work Statistician. Screening  You may have the following tests or measurements: Height, weight, and BMI. Blood pressure. Lipid and cholesterol levels. These may be checked every 5 years, or more frequently if you are over 53 years old. Skin check. Lung cancer screening. You may have this screening every year starting at age 58 if you have a 30-pack-year history of smoking and currently smoke or have quit within the past 15 years. Fecal occult blood test (FOBT) of the stool. You may have this test every year starting at age 24. Flexible sigmoidoscopy or colonoscopy. You may have a sigmoidoscopy every 5 years or a colonoscopy every 10 years starting at age 56. Prostate cancer screening. Recommendations will vary depending on your family history and other risks. Hepatitis C blood test. Hepatitis B blood test. Sexually transmitted disease (STD) testing. Diabetes screening. This is done by checking your blood sugar (glucose) after you have not eaten for a while (fasting). You may have this done every 1-3 years. Abdominal aortic aneurysm (AAA) screening. You may need this if you are a current or former smoker. Osteoporosis. You may be screened starting at age 38 if you are at high risk. Talk with your health care provider about your test results, treatment options, and if necessary, the  need for more tests. Vaccines  Your health care provider may recommend certain vaccines, such as: Influenza vaccine. This is recommended every year. Tetanus, diphtheria, and acellular pertussis (Tdap, Td) vaccine. You may need a Td booster every 10 years. Zoster vaccine. You may need this after age 13. Pneumococcal 13-valent conjugate (PCV13) vaccine. One dose is recommended after age 90. Pneumococcal polysaccharide (PPSV23) vaccine. One dose is recommended after age 20. Talk to your health care provider about which screenings and vaccines you need and how often you need them. This information is not intended to replace advice given to you by your health care provider. Make sure you discuss any questions you have with your health care provider. Document Released: 01/09/2016 Document Revised: 09/01/2016 Document Reviewed: 10/14/2015 Elsevier Interactive Patient Education  2017 [place] Prevention in the Home Falls can cause injuries. They can happen to people of all ages. There are many things you can do to make your home safe and to help prevent falls. What can I do on the outside of my home? Regularly fix the edges of walkways and driveways and fix any cracks. Remove anything that might make you trip as you walk through a door, such as a raised step or threshold. Trim any bushes or trees on the path to your home. Use bright outdoor lighting. Clear any walking paths of anything that might make someone trip, such as rocks or tools. Regularly check to see if handrails are loose or broken. Make sure that both  sides of any steps have handrails. Any raised decks and porches should have guardrails on the edges. Have any leaves, snow, or ice cleared regularly. Use sand or salt on walking paths during winter. Clean up any spills in your garage right away. This includes oil or grease spills. What can I do in the bathroom? Use night lights. Install grab bars by the toilet and in the tub  and shower. Do not use towel bars as grab bars. Use non-skid mats or decals in the tub or shower. If you need to sit down in the shower, use a plastic, non-slip stool. Keep the floor dry. Clean up any water that spills on the floor as soon as it happens. Remove soap buildup in the tub or shower regularly. Attach bath mats securely with double-sided non-slip rug tape. Do not have throw rugs and other things on the floor that can make you trip. What can I do in the bedroom? Use night lights. Make sure that you have a light by your bed that is easy to reach. Do not use any sheets or blankets that are too big for your bed. They should not hang down onto the floor. Have a firm chair that has side arms. You can use this for support while you get dressed. Do not have throw rugs and other things on the floor that can make you trip. What can I do in the kitchen? Clean up any spills right away. Avoid walking on wet floors. Keep items that you use a lot in easy-to-reach places. If you need to reach something above you, use a strong step stool that has a grab bar. Keep electrical cords out of the way. Do not use floor polish or wax that makes floors slippery. If you must use wax, use non-skid floor wax. Do not have throw rugs and other things on the floor that can make you trip. What can I do with my stairs? Do not leave any items on the stairs. Make sure that there are handrails on both sides of the stairs and use them. Fix handrails that are broken or loose. Make sure that handrails are as long as the stairways. Check any carpeting to make sure that it is firmly attached to the stairs. Fix any carpet that is loose or worn. Avoid having throw rugs at the top or bottom of the stairs. If you do have throw rugs, attach them to the floor with carpet tape. Make sure that you have a light switch at the top of the stairs and the bottom of the stairs. If you do not have them, ask someone to add them for  you. What else can I do to help prevent falls? Wear shoes that: Do not have high heels. Have rubber bottoms. Are comfortable and fit you well. Are closed at the toe. Do not wear sandals. If you use a stepladder: Make sure that it is fully opened. Do not climb a closed stepladder. Make sure that both sides of the stepladder are locked into place. Ask someone to hold it for you, if possible. Clearly mark and make sure that you can see: Any grab bars or handrails. First and last steps. Where the edge of each step is. Use tools that help you move around (mobility aids) if they are needed. These include: Canes. Walkers. Scooters. Crutches. Turn on the lights when you go into a dark area. Replace any light bulbs as soon as they burn out. Set up your furniture so you  have a clear path. Avoid moving your furniture around. If any of your floors are uneven, fix them. If there are any pets around you, be aware of where they are. Review your medicines with your doctor. Some medicines can make you feel dizzy. This can increase your chance of falling. Ask your doctor what other things that you can do to help prevent falls. This information is not intended to replace advice given to you by your health care provider. Make sure you discuss any questions you have with your health care provider. Document Released: 10/09/2009 Document Revised: 05/20/2016 Document Reviewed: 01/17/2015 Elsevier Interactive Patient Education  2017 [employer].

## 2022-10-08 ENCOUNTER — Other Ambulatory Visit: Payer: Self-pay | Admitting: Cardiology

## 2022-10-13 NOTE — Telephone Encounter (Signed)
Medication refilled per ordered

## 2022-10-13 NOTE — Telephone Encounter (Signed)
I do not think it was actually discontinued.  I think that was the way it is refilled.  I did not intend for her to be stopped.  Glenetta Hew, MD

## 2022-11-15 ENCOUNTER — Encounter: Payer: Self-pay | Admitting: Family Medicine

## 2022-11-15 ENCOUNTER — Ambulatory Visit (INDEPENDENT_AMBULATORY_CARE_PROVIDER_SITE_OTHER): Payer: Medicare HMO | Admitting: Family Medicine

## 2022-11-15 VITALS — BP 122/60 | HR 75 | Temp 98.0°F | Ht 74.0 in | Wt 228.2 lb

## 2022-11-15 DIAGNOSIS — I10 Essential (primary) hypertension: Secondary | ICD-10-CM

## 2022-11-15 DIAGNOSIS — E785 Hyperlipidemia, unspecified: Secondary | ICD-10-CM | POA: Diagnosis not present

## 2022-11-15 DIAGNOSIS — E0590 Thyrotoxicosis, unspecified without thyrotoxic crisis or storm: Secondary | ICD-10-CM

## 2022-11-15 DIAGNOSIS — J45909 Unspecified asthma, uncomplicated: Secondary | ICD-10-CM

## 2022-11-15 MED ORDER — METHIMAZOLE 5 MG PO TABS: 5.0000 mg | ORAL_TABLET | Freq: Every day | ORAL | 3 refills | Status: DC

## 2022-11-15 MED ORDER — FLUTICASONE-SALMETEROL 115-21 MCG/ACT IN AERO: 2.0000 | INHALATION_SPRAY | Freq: Two times a day (BID) | RESPIRATORY_TRACT | 12 refills | Status: DC

## 2022-11-15 MED ORDER — ROSUVASTATIN CALCIUM 5 MG PO TABS: 5.0000 mg | ORAL_TABLET | Freq: Every day | ORAL | 1 refills | Status: DC

## 2022-11-15 MED ORDER — CHLORTHALIDONE 50 MG PO TABS: 50.0000 mg | ORAL_TABLET | Freq: Every day | ORAL | 3 refills | Status: DC

## 2022-11-15 NOTE — Progress Notes (Unsigned)
Established Patient Office Visit  Subjective   Patient ID: Matthew Trujillo, male    DOB: 18-Apr-1951  Age: 71 y.o. MRN: [id number]  Chief Complaint  Patient presents with   Establish Care   Medication Refill    Patient requested PCP continue refills on Methimazole previously given by Dr Loanne Drilling (endo)    Pt is here for transition of care visit.  Moderate persistent asthma-- patient is on Advair 115-21 mcg. And also on albuterol inhaler, states he only needs it a couple times per month. States that he used to watch his grandkids and sometimes they would bring in colds and other things.   HTN -- pt reports he is compliant with the medications, states that he does have a BP cuff but doesn't check his BP much at home. States that when he does check it is usually normal. Denies side effects to the medications.   Hyperthyroidism-- patient is on 5 mg methimazole daily. States he has been on this medication for many years. States he no longer sees the endocrinologist, states that he was told to continue checking TSH yearly and if his TSH increases then he should stop the methimazole. States he is on primidone due to the tremor that started with the hyperthyroidism and it never went away. States the tremor is well controlled wit hthe primidone currently. Needs new labs this year.  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 States it is working well, denies any bleeding.    Current Outpatient Medications  Medication Instructions   albuterol (PROVENTIL) 2.5 mg, Nebulization, Every 6 hours PRN   albuterol (VENTOLIN HFA) 108 (90 Base) MCG/ACT inhaler INHALE 2 PUFFS INTO THE LUNGS EVERY 4 HOURS AS NEEDED FOR WHEEZE   amLODipine (NORVASC) 10 MG tablet TAKE 1 TABLET BY MOUTH EVERY DAY   Calcium Carbonate-Vitamin D 600-400 MG-UNIT tablet 1 tablet, Oral, Daily,     chlorthalidone (HYGROTON) 50 mg, Oral, Daily   edoxaban (SAVAYSA) 60 mg, Oral, Daily   fluticasone-salmeterol  (ADVAIR HFA) 115-21 MCG/ACT inhaler 2 puffs, Inhalation, 2 times daily   methimazole (TAPAZOLE) 5 mg, Oral, Daily   Multiple Vitamin (MULTIVITAMIN) tablet 1 tablet, Oral, Daily,     Omega-3 Fatty Acids (FISH OIL) 1000 MG CAPS Oral, 2 times daily   primidone (MYSOLINE) 50 mg, Oral, 2 times daily   ramipril (ALTACE) 10 mg, Oral, Daily   rosuvastatin (CRESTOR) 5 mg, Oral, Daily   Spacer/Aero-Holding Chambers (E-Z SPACER) inhaler Use as instructed   Vitamin D-3 5,000 Units, Oral, Daily    Patient Active Problem List   Diagnosis Date Noted   Asthma 04/12/2022   GI bleed 08/08/2019   Abdominal pain 08/08/2019   Factor V Leiden ([place]) 08/08/2019   Ischemic colitis ([place])    Chondromalacia, 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     Objective:     BP 122/60 (BP Location: Left Arm, Patient Position: Sitting, Cuff Size: Large)   Pulse 75   Temp 98 F (36.7  C) (Oral)   Ht '6\' 2"'$  (1.88 m)   Wt 228 lb 3.2 oz (103.5 kg)   SpO2 98%   BMI 29.30 kg/m  {Vitals History (Optional):23777}  Physical Exam Vitals reviewed.  Constitutional:      Appearance: Normal appearance. He is well-groomed and normal weight.  Eyes:     Conjunctiva/sclera: Conjunctivae normal.  Neck:     Thyroid: No thyromegaly.  Cardiovascular:     Rate and Rhythm: Normal rate and regular rhythm.     Heart sounds: S1 normal and S2 normal. No murmur heard. Pulmonary:     Effort: Pulmonary effort is normal.     Breath sounds: Normal breath sounds  and air entry. No rales.  Abdominal:     General: Bowel sounds are normal.  Musculoskeletal:     Right lower leg: No edema.     Left lower leg: No edema.  Neurological:     General: No focal deficit present.     Mental Status: He is alert and oriented to person, place, and time.     Gait: Gait is intact.  Psychiatric:        Mood and Affect: Mood and affect normal.      No results found for any visits on 11/15/22.  {Labs (Optional):23779}  The 10-year ASCVD risk score (Arnett DK, et al., 2019) is: 15.2%    Assessment & Plan:   Problem List Items Addressed This Visit       Unprioritized   Essential hypertension (Chronic)    Current hypertension medications:       Sig   amLODipine (NORVASC) 10 MG tablet (Taking) TAKE 1 TABLET BY MOUTH EVERY DAY   ramipril (ALTACE) 10 MG capsule (Taking) Take 1 capsule (10 mg total) by mouth daily.   chlorthalidone (HYGROTON) 50 MG tablet Take 1 tablet (50 mg total) by mouth daily.     BP today in office is well controlled, pt is doing well on the above medications, will continue as prescribed.       Relevant Medications   chlorthalidone (HYGROTON) 50 MG tablet   rosuvastatin (CRESTOR) 5 MG tablet   Other Relevant Orders   CMP   Hyperthyroidism - Primary    Secondary to Graves disease, will refill his methimazole  5 mg daily today and recheck TSH and Free T4,       Relevant Medications   methimazole (TAPAZOLE) 5 MG tablet   Other Relevant Orders   TSH   T4, Free   Hyperlipidemia with target LDL less than 100 (Chronic)   Relevant Medications   chlorthalidone (HYGROTON) 50 MG tablet   rosuvastatin (CRESTOR) 5 MG tablet   Other Relevant Orders   Lipid Panel   Asthma   Relevant Medications   fluticasone-salmeterol (ADVAIR HFA) 115-21 MCG/ACT inhaler    Return in about 1 year (around 11/16/2023) for Annual check up .    Farrel Conners, MD

## 2022-11-15 NOTE — Assessment & Plan Note (Signed)
Secondary to Graves disease, will refill his methimazole  5 mg daily today and recheck TSH and Free T4,

## 2022-11-15 NOTE — Telephone Encounter (Signed)
Requests added to notes in the reason for office visit today.

## 2022-11-15 NOTE — Assessment & Plan Note (Signed)
Current hypertension medications:       Sig   amLODipine (NORVASC) 10 MG tablet (Taking) TAKE 1 TABLET BY MOUTH EVERY DAY   ramipril (ALTACE) 10 MG capsule (Taking) Take 1 capsule (10 mg total) by mouth daily.   chlorthalidone (HYGROTON) 50 MG tablet Take 1 tablet (50 mg total) by mouth daily.      BP today in office is well controlled, pt is doing well on the above medications, will continue as prescribed.

## 2022-11-16 NOTE — Assessment & Plan Note (Signed)
Needs new lipid panel today, will order. Continue crestor 5 mg daily.

## 2022-11-16 NOTE — Assessment & Plan Note (Signed)
Moderate persistent, lungs clear on exam today, will continue advair 115-21 mcg, 2 puff BID.

## 2022-11-23 ENCOUNTER — Other Ambulatory Visit (INDEPENDENT_AMBULATORY_CARE_PROVIDER_SITE_OTHER): Payer: Medicare HMO

## 2022-11-23 DIAGNOSIS — I10 Essential (primary) hypertension: Secondary | ICD-10-CM

## 2022-11-23 DIAGNOSIS — E785 Hyperlipidemia, unspecified: Secondary | ICD-10-CM | POA: Diagnosis not present

## 2022-11-23 DIAGNOSIS — E0590 Thyrotoxicosis, unspecified without thyrotoxic crisis or storm: Secondary | ICD-10-CM

## 2022-11-23 LAB — COMPREHENSIVE METABOLIC PANEL
ALT: 37 U/L (ref 0–53)
AST: 25 U/L (ref 0–37)
Albumin: 4.5 g/dL (ref 3.5–5.2)
Alkaline Phosphatase: 46 U/L (ref 39–117)
BUN: 22 mg/dL (ref 6–23)
CO2: 29 mEq/L (ref 19–32)
Calcium: 9.8 mg/dL (ref 8.4–10.5)
Chloride: 99 mEq/L (ref 96–112)
Creatinine, Ser: 0.91 mg/dL (ref 0.40–1.50)
GFR: 86.05 mL/min (ref >60.00)
Glucose, Bld: 97 mg/dL (ref 70–99)
Potassium: 3.7 mEq/L (ref 3.5–5.1)
Sodium: 136 mEq/L (ref 135–145)
Total Bilirubin: 0.6 mg/dL (ref 0.2–1.2)
Total Protein: 7.2 g/dL (ref 6.0–8.3)

## 2022-11-23 LAB — LIPID PANEL
Cholesterol: 146 mg/dL (ref 0–200)
HDL: 40 mg/dL (ref >39.00)
LDL Cholesterol: 82 mg/dL (ref 0–99)
NonHDL: 105.76
Total CHOL/HDL Ratio: 4
Triglycerides: 120 mg/dL (ref 0.0–149.0)
VLDL: 24 mg/dL (ref 0.0–40.0)

## 2022-11-23 LAB — T4, FREE: Free T4: 0.71 ng/dL (ref 0.60–1.60)

## 2022-11-23 LAB — TSH: TSH: 3.59 u[IU]/mL (ref 0.35–5.50)

## 2022-11-23 NOTE — Progress Notes (Signed)
All labs are normal. Continue all medications as prescribed.

## 2022-12-20 ENCOUNTER — Encounter: Payer: Self-pay | Admitting: Family Medicine

## 2022-12-22 ENCOUNTER — Encounter: Payer: Self-pay | Admitting: Family Medicine

## 2022-12-22 ENCOUNTER — Telehealth (INDEPENDENT_AMBULATORY_CARE_PROVIDER_SITE_OTHER): Payer: Medicare HMO | Admitting: Family Medicine

## 2022-12-22 VITALS — Temp 98.8°F

## 2022-12-22 DIAGNOSIS — U071 COVID-19: Secondary | ICD-10-CM

## 2022-12-22 MED ORDER — NIRMATRELVIR/RITONAVIR (PAXLOVID)TABLET: 3.0000 | ORAL_TABLET | Freq: Two times a day (BID) | ORAL | 0 refills | Status: AC

## 2022-12-22 NOTE — Progress Notes (Signed)
Established Patient Office Visit  Subjective   Patient ID: Matthew Trujillo, male    DOB: [birth year]  Age: 71 y.o. MRN: [id number]  Chief Complaint  Patient presents with   Covid Positive    Patient states the home Covid test was positive 2 days ago, states his daughter tested positive also   Chills    X2 days   Generalized Body Aches    X2 days, taking Tylenol   Cough    Non-productive x2 days, now productive with brown sputum noted this morning   Nasal Congestion    X2 days, taking Fluticasone and Zyrtec with relief   I connected with  [name] on 12/22/22 by a video enabled telemedicine application and verified that I am speaking with the correct person using two identifiers.   I discussed the limitations of evaluation and management by telemedicine. The patient expressed understanding and agreed to proceed.   Patient location: home address  Provider location: [employer] office.  Patient is here for positive COVID 2 days ago, states that his symptoms include coughing, no fever but positive for chills. States his cough initially was non productive but now he is coughing up mucus-- initially clear but now brownish. No chest pain or difficulty breathing. He does have a history of asthma and is using his advair inhaler twice a day which is helping with the cough. He is also using tylenol and OTC antihistamines for his mucus production and chills.     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     Objective:     Temp 98.8 F (37.1 C)    Physical Exam Constitutional:      General: He is not in acute distress.    Appearance: Normal appearance. He is normal weight.  Pulmonary:     Effort: Pulmonary effort is normal.  Neurological:     Mental Status: He is alert and oriented to person, place, and time. Mental status is at baseline.      No results found for any visits on 12/22/22.    The 10-year ASCVD risk score (Arnett DK, et al., 2019) is: 17%    Assessment & Plan:   Problem List Items Addressed This Visit   None Visit Diagnoses     COVID-19    -  Primary   Relevant Medications   nirmatrelvir/ritonavir (PAXLOVID) 20 x 150 MG & 10 x '100MG'$  TABS     Given his age and history of asthma I recommended treating with Paxlovid to help reduce the severity and duration of illness. I reviewed the patient's GFR and his other medications. RX sent to pharmacy. Patient was counselled on isolating until day 7 of his illness and he may come out of isolation if his chills are resolved and his  coughing is improved. Pt advised to wear a mask for an additional 7 days after he comes out of isolation. RTC if sx worsen or persist.  No follow-ups on file.  I spent 20 minutes on this video enabled face to face encounter with the patient today.  Farrel Conners, MD

## 2023-01-12 ENCOUNTER — Other Ambulatory Visit: Payer: Self-pay | Admitting: Family Medicine

## 2023-01-12 ENCOUNTER — Other Ambulatory Visit: Payer: Self-pay | Admitting: Family

## 2023-01-12 DIAGNOSIS — U071 COVID-19: Secondary | ICD-10-CM

## 2023-01-12 DIAGNOSIS — J309 Allergic rhinitis, unspecified: Secondary | ICD-10-CM

## 2023-01-18 ENCOUNTER — Other Ambulatory Visit: Payer: Self-pay | Admitting: *Deleted

## 2023-01-18 MED ORDER — ALBUTEROL SULFATE HFA 108 (90 BASE) MCG/ACT IN AERS: INHALATION_SPRAY | RESPIRATORY_TRACT | 2 refills | Status: AC

## 2023-01-26 ENCOUNTER — Telehealth: Payer: Self-pay | Admitting: *Deleted

## 2023-01-26 ENCOUNTER — Encounter: Payer: Self-pay | Admitting: *Deleted

## 2023-01-26 NOTE — Patient Outreach (Signed)
  Care Coordination   Initial Visit Note   01/26/2023 Name: Matthew Trujillo MRN: [id number] DOB: September 23, 1951  Matthew Trujillo is a 72 y.o. year old male who sees Matthew Conners, MD for primary care. I spoke with  Matthew Trujillo by phone today.  What matters to the patients health and wellness today?  Requested medication refill on Methimazole 5 mg for daily use.    Goals Addressed               This Visit's Progress     COMPLETED: Need refills on medication (pt-stated)        Care Coordination Interventions: Reviewed medications with patient and discussed Adherence with all medications Collaborated with Matthew Trujillo regarding needed refill on medications Methimazole. Reviewed scheduled/upcoming provider appointments including sufficient transportation source Assessed social determinant of health barriers Educated on care management services related to pharmacy, social workers, and Designer, jewellery. Pt with no immediate care management needs only requested the above refill on the medications for Methimazole 5 mg qd.           SDOH assessments and interventions completed:  Yes  SDOH Interventions Today    Flowsheet Row Most Recent Value  SDOH Interventions   Food Insecurity Interventions Intervention Not Indicated  Housing Interventions Intervention Not Indicated  Transportation Interventions Intervention Not Indicated  Utilities Interventions Intervention Not Indicated        Care Coordination Interventions:  Yes, provided   Follow up plan: No further intervention required.   Encounter Outcome:  Pt. Visit Completed   Matthew Mina, RN Care Management Coordinator [place] Office [phone]

## 2023-01-26 NOTE — Patient Instructions (Signed)
Visit Information  Thank you for taking time to visit with me today. Please don't hesitate to contact me if I can be of assistance to you.   Following are the goals we discussed today:   Goals Addressed               This Visit's Progress     COMPLETED: Need refills on medication (pt-stated)        Care Coordination Interventions: Reviewed medications with patient and discussed Adherence with all medications Collaborated with Dr. Legrand Como regarding needed refill on medications Methimazole. Reviewed scheduled/upcoming provider appointments including sufficient transportation source Assessed social determinant of health barriers Educated on care management services related to pharmacy, social workers, and Designer, jewellery. Pt with no immediate care management needs only requested the above refill on the medications for Methimazole 5 mg qd.           Please call the care guide team at [phone] if you need to cancel or reschedule your appointment.   If you are experiencing a Mental Health or [place] or need someone to talk to, please call the Suicide and Crisis Lifeline: [phone]  Patient verbalizes understanding of instructions and care plan provided today and agrees to view in [place]. Active MyChart status and patient understanding of how to access instructions and care plan via MyChart confirmed with patient.     No further follow up required: No needs  Raina Mina, RN Care Management Coordinator [place] Office [phone]

## 2023-01-26 NOTE — Patient Outreach (Signed)
  Care Coordination   01/26/2023 Name: HAADI SANTELLAN MRN: [id number] DOB: 25-Mar-1951   Care Coordination Outreach Attempts:  An unsuccessful telephone outreach was attempted today to offer the patient information about available care coordination services as a benefit of their health plan.   Follow Up Plan:  Additional outreach attempts will be made to offer the patient care coordination information and services.   Encounter Outcome:  No Answer   Care Coordination Interventions:  No, not indicated    Raina Mina, RN Care Management Coordinator [place] Office [phone]

## 2023-02-25 ENCOUNTER — Telehealth: Payer: Self-pay

## 2023-02-25 NOTE — Patient Outreach (Signed)
  Care Coordination   02/25/2023 Name: Matthew Trujillo MRN: [id number] DOB: Jun 02, 1951   Care Coordination Outreach Attempts:  An unsuccessful telephone outreach was attempted today to offer the patient information about available care coordination services as a benefit of their health plan.   Follow Up Plan:  No further outreach attempts will be made at this time. We have been unable to contact the patient to offer or enroll patient in care coordination services  Encounter Outcome:  No Answer   Care Coordination Interventions:  No, not indicated    Jone Baseman, RN, MSN [place] [facility] Care Management Coordinator Direct Line [phone]

## 2023-03-07 DIAGNOSIS — L821 Other seborrheic keratosis: Secondary | ICD-10-CM | POA: Diagnosis not present

## 2023-03-07 DIAGNOSIS — L814 Other melanin hyperpigmentation: Secondary | ICD-10-CM | POA: Diagnosis not present

## 2023-03-07 DIAGNOSIS — D225 Melanocytic nevi of trunk: Secondary | ICD-10-CM | POA: Diagnosis not present

## 2023-03-18 ENCOUNTER — Other Ambulatory Visit: Payer: Self-pay | Admitting: Neurology

## 2023-03-18 DIAGNOSIS — G250 Essential tremor: Secondary | ICD-10-CM

## 2023-03-21 ENCOUNTER — Other Ambulatory Visit: Payer: Self-pay | Admitting: *Deleted

## 2023-03-22 ENCOUNTER — Other Ambulatory Visit: Payer: Self-pay | Admitting: Adult Health

## 2023-03-22 MED ORDER — EDOXABAN TOSYLATE 60 MG PO TABS: 60.0000 mg | ORAL_TABLET | Freq: Every day | ORAL | 0 refills | Status: DC

## 2023-03-22 NOTE — Telephone Encounter (Signed)
Rx done. 

## 2023-03-25 DIAGNOSIS — R300 Dysuria: Secondary | ICD-10-CM | POA: Diagnosis not present

## 2023-03-25 DIAGNOSIS — R319 Hematuria, unspecified: Secondary | ICD-10-CM | POA: Diagnosis not present

## 2023-03-25 DIAGNOSIS — N410 Acute prostatitis: Secondary | ICD-10-CM | POA: Diagnosis not present

## 2023-04-08 ENCOUNTER — Other Ambulatory Visit: Payer: Self-pay | Admitting: Cardiology

## 2023-05-02 ENCOUNTER — Other Ambulatory Visit: Payer: Self-pay | Admitting: Cardiology

## 2023-05-18 NOTE — Progress Notes (Signed)
Assessment/Plan:    1.  Essential Tremor   -d/c primidone because of going back to Eliquis (decrease x 1 week and then d/c)  -He has been told he could not take beta-blocker because of his asthma.  He tried metoprolol in the past and was taken off of it because of his asthma.  -We discussed gabapentin and anticholinergics, although neither is particularly ideal.  We will start gabapentin 300 mg q hs x 2 weeks and then 300 mg bid.  R/B/SE were discussed.  The opportunity to ask questions was given and they were answered to the best of my ability.  The patient expressed understanding and willingness to follow the outlined treatment protocols.  -Patient has been on topiramate in the past with side effects  2.  Hyperthyroidism  -Follows with Dr. Everardo All.  Reportedly well controlled with methimazole.  3.  Factor V Leiden  -Had to discontinue savaysa and is now on Eliquis   4.  F/u 6 months  Subjective:   Matthew Trujillo was seen today in follow up for essential tremor.  My previous records were reviewed prior to todays visit.  I have not seen the patient in about a year.  When I saw the patient last year, I did tell him that if he had to go back on Eliquis he would have to discontinue the primidone.  Unfortunately, he did have to go back on the Eliquis.  He has not d/c the primidone yet.  He has only been on eliquis x 3 weeks.    Current prescribed movement disorder medications: None   PREVIOUS MEDICATIONS: Metoprolol (taken off in the past because of asthma); topiramate (cognitive change and depression)  ALLERGIES:   Allergies  Allergen Reactions   Celebrex [Celecoxib]     Blood in stool   Other     Animal dander causes sneezing and watery eyes   Neosporin [Bacitracin-Polymyxin B] Rash    CURRENT MEDICATIONS:  Outpatient Encounter Medications as of 05/24/2023  Medication Sig   albuterol (PROVENTIL) (2.5 MG/3ML) 0.083% nebulizer solution Take 3 mLs (2.5 mg total) by  nebulization every 6 (six) hours as needed for wheezing or shortness of breath.   albuterol (VENTOLIN HFA) 108 (90 Base) MCG/ACT inhaler INHALE 2 PUFFS INTO THE LUNGS EVERY 4 HOURS AS NEEDED FOR WHEEZE   amLODipine (NORVASC) 10 MG tablet TAKE 1 TABLET BY MOUTH EVERY DAY   apixaban (ELIQUIS) 5 MG TABS tablet Take 1 tablet (5 mg total) by mouth 2 (two) times daily.   Calcium Carbonate-Vitamin D 600-400 MG-UNIT tablet Take 1 tablet by mouth daily.   chlorthalidone (HYGROTON) 50 MG tablet Take 1 tablet (50 mg total) by mouth daily.   Cholecalciferol (VITAMIN D-3) 5000 units TABS Take 5,000 Units by mouth daily.   fluticasone-salmeterol (ADVAIR HFA) 115-21 MCG/ACT inhaler Inhale 2 puffs into the lungs 2 (two) times daily.   methimazole (TAPAZOLE) 5 MG tablet Take 1 tablet (5 mg total) by mouth daily.   Multiple Vitamin (MULTIVITAMIN) tablet Take 1 tablet by mouth daily.   Omega-3 Fatty Acids (FISH OIL) 1000 MG CAPS Take by mouth 2 (two) times daily.   primidone (MYSOLINE) 50 MG tablet TAKE 1 TABLET (50 MG) BY MOUTH IN THE MORNING AND AT BEDTIME   ramipril (ALTACE) 10 MG capsule TAKE 1 CAPSULE BY MOUTH EVERY DAY   rosuvastatin (CRESTOR) 5 MG tablet Take 1 tablet (5 mg total) by mouth daily.   Spacer/Aero-Holding Chambers (E-Z SPACER) inhaler Use as instructed  No facility-administered encounter medications on file as of 05/24/2023.     Objective:    PHYSICAL EXAMINATION:    VITALS:   There were no vitals filed for this visit.   GEN:  The patient appears stated age and is in NAD. HEENT:  Normocephalic, atraumatic.  The mucous membranes are moist. The superficial temporal arteries are without ropiness or tenderness. CV:  RRR Lungs:  CTAB Neck/HEME:  There are no carotid bruits bilaterally.  Neurological examination:  Orientation: The patient is alert and oriented x3. Cranial nerves: There is good facial symmetry. The speech is fluent and clear. Soft palate rises symmetrically and there  is no tongue deviation. Hearing is intact to conversational tone. Sensation: Sensation is intact to light touch throughout Motor: Strength is at least antigravity x4.  Movement examination: Tone: There is normal tone in the UE/LE Abnormal movements: no rest tremor.  Mild postural tremor, R>L.  Archimedes spirals much improved. Coordination:  There is no decremation with RAM's Gait and Station: The patient has no difficulty arising out of a deep-seated chair without the use of the hands. The patient's stride length is good I have reviewed and interpreted the following labs independently   Chemistry      Component Value Date/Time   NA 136 11/23/2022 0834   K 3.7 11/23/2022 0834   CL 99 11/23/2022 0834   CO2 29 11/23/2022 0834   BUN 22 11/23/2022 0834   CREATININE 0.91 11/23/2022 0834   CREATININE 0.91 11/24/2020 0932      Component Value Date/Time   CALCIUM 9.8 11/23/2022 0834   ALKPHOS 46 11/23/2022 0834   AST 25 11/23/2022 0834   ALT 37 11/23/2022 0834   BILITOT 0.6 11/23/2022 0834      Lab Results  Component Value Date   WBC 9.8 12/01/2021   HGB 15.5 12/01/2021   HCT 45.8 12/01/2021   MCV 90.8 12/01/2021   PLT 308.0 12/01/2021   Lab Results  Component Value Date   TSH 3.59 11/23/2022     Chemistry      Component Value Date/Time   NA 136 11/23/2022 0834   K 3.7 11/23/2022 0834   CL 99 11/23/2022 0834   CO2 29 11/23/2022 0834   BUN 22 11/23/2022 0834   CREATININE 0.91 11/23/2022 0834   CREATININE 0.91 11/24/2020 0932      Component Value Date/Time   CALCIUM 9.8 11/23/2022 0834   ALKPHOS 46 11/23/2022 0834   AST 25 11/23/2022 0834   ALT 37 11/23/2022 0834   BILITOT 0.6 11/23/2022 0834       Cc:  Karie Georges, MD

## 2023-05-24 ENCOUNTER — Ambulatory Visit: Payer: Medicare HMO | Admitting: Neurology

## 2023-05-24 VITALS — BP 132/84 | HR 81 | Ht 74.0 in | Wt 233.0 lb

## 2023-05-24 DIAGNOSIS — G250 Essential tremor: Secondary | ICD-10-CM

## 2023-05-24 MED ORDER — GABAPENTIN 300 MG PO CAPS: 300.0000 mg | ORAL_CAPSULE | Freq: Two times a day (BID) | ORAL | 1 refills | Status: DC

## 2023-05-24 NOTE — Patient Instructions (Addendum)
Decrease primidone x 1 week and then STOP the primidone Start gabapentin 300 mg x 2 weeks and then increase to 300 mg twice per day  The physicians and staff at [facility] are committed to providing excellent care. You may receive a survey requesting feedback about your experience at our office. We strive to receive "very good" responses to the survey questions. If you feel that your experience would prevent you from giving the office a "very good " response, please contact our office to try to remedy the situation. We may be reached at [phone]. Thank you for taking the time out of your busy day to complete the survey.

## 2023-06-04 DIAGNOSIS — J209 Acute bronchitis, unspecified: Secondary | ICD-10-CM | POA: Diagnosis not present

## 2023-06-14 ENCOUNTER — Other Ambulatory Visit: Payer: Self-pay | Admitting: Neurology

## 2023-06-14 DIAGNOSIS — G250 Essential tremor: Secondary | ICD-10-CM

## 2023-06-24 ENCOUNTER — Other Ambulatory Visit: Payer: Self-pay | Admitting: Family Medicine

## 2023-06-24 DIAGNOSIS — E785 Hyperlipidemia, unspecified: Secondary | ICD-10-CM

## 2023-07-16 ENCOUNTER — Other Ambulatory Visit: Payer: Self-pay | Admitting: Adult Health

## 2023-07-18 ENCOUNTER — Ambulatory Visit (INDEPENDENT_AMBULATORY_CARE_PROVIDER_SITE_OTHER): Payer: Medicare HMO

## 2023-07-18 VITALS — Ht 74.0 in | Wt 233.0 lb

## 2023-07-18 DIAGNOSIS — Z0000 Encounter for general adult medical examination without abnormal findings: Secondary | ICD-10-CM

## 2023-07-18 NOTE — Patient Instructions (Addendum)
[name] , Thank you for taking time to come for your Medicare Wellness Visit. I appreciate your ongoing commitment to your health goals. Please review the following plan we discussed and let me know if I can assist you in the future.   These are the goals we discussed:  Goals       Increase physical activity (pt-stated)      Weight Loss Achieved      Evidence-based guidance:  Review medication that may contribute to weight gain, such as corticosteroid, beta-blocker, tricyclic antidepressant, oral antihyperglycemic; advocate for changes when appropriate.  Perform or refer to registered dietitian to perform comprehensive nutrition assessment that includes disordered-eating behaviors, such as binge-eating, emotional or compulsive eating, grazing.  Counsel patient regarding health risks of obesity and that weight loss goal of 5 to 10 percent of initial weight will improve risk.  Recommend initial weight loss goal of 3 to 5 percent of bodyweight; increase weight-loss goals based on patient success as achieving greater weight loss continues to reduce risk.  Propose a calorie-reduced diet based on the patient's preferences and health status.  Provide ongoing emotional support or cognitive behavioral therapy and dietitian services (individual, group, virtual) over at least 6 months with a minimum of 14 encounters to best facilitate weight loss.  Provide monthly follow-up for 12 months when weight loss goal is met to assist with maintenance of weight loss.  Encourage increased physical activity or exercise based on individual age, risk, and ability up to 200 to 300 minutes per week that includes aerobic and resistance training.  Encourage reduction in sedentary behaviors by replacing them with nonexercise yet active leisure pursuits.  Identify physical barriers, such as change in posture, balance, gait patterns, joint pain, and environmental barriers to activity.  Consider referral to rehabilitation  therapy, especially when mobility or function is impaired due to osteoarthritis and obesity.  Consider referral to weight-loss program that has published evidence of safety and efficacy if on-site intensive intervention is unavailable or patient preference.  Prepare patient for use of pharmacologic therapy as an adjunct to lifestyle changes based on body mass index, patient agreement and presence of risk factors or comorbidities.  Evaluate efficacy of pharmacologic therapy (weight loss) and tolerance to medication periodically.  Engage in shared decision-making regarding referral to bariatric surgeon for consultation and evaluation when weight-loss goal has not been accomplished by behavioral therapy with or without pharmacologic therapy.   Notes:         This is a list of the screening recommended for you and due dates:  Health Maintenance  Topic Date Due   COVID-19 Vaccine (7 - 2023-24 season) 08/03/2023*   Flu Shot  07/28/2023   Colon Cancer Screening  11/21/2023   Medicare Annual Wellness Visit  07/17/2024   DTaP/Tdap/Td vaccine (2 - Td or Tdap) 09/14/2029   Pneumonia Vaccine  Completed   Hepatitis C Screening  Completed   Zoster (Shingles) Vaccine  Completed   HPV Vaccine  Aged Out  *Topic was postponed. The date shown is not the original due date.    Advanced directives: Please bring a copy of your health care power of attorney and living will to the office to be added to your chart at your convenience.   Conditions/risks identified: None  Next appointment: Follow up in one year for your annual wellness visit.   Preventive Care 49 Years and Older, Male  Preventive care refers to lifestyle choices and visits with your health care provider that can  promote health and wellness. What does preventive care include? A yearly physical exam. This is also called an annual well check. Dental exams once or twice a year. Routine eye exams. Ask your health care provider how often you  should have your eyes checked. Personal lifestyle choices, including: Daily care of your teeth and gums. Regular physical activity. Eating a healthy diet. Avoiding tobacco and drug use. Limiting alcohol use. Practicing safe sex. Taking low doses of aspirin every day. Taking vitamin and mineral supplements as recommended by your health care provider. What happens during an annual well check? The services and screenings done by your health care provider during your annual well check will depend on your age, overall health, lifestyle risk factors, and family history of disease. Counseling  Your health care provider may ask you questions about your: Alcohol use. Tobacco use. Drug use. Emotional well-being. Home and relationship well-being. Sexual activity. Eating habits. History of falls. Memory and ability to understand (cognition). Work and work Astronomer. Screening  You may have the following tests or measurements: Height, weight, and BMI. Blood pressure. Lipid and cholesterol levels. These may be checked every 5 years, or more frequently if you are over 3 years old. Skin check. Lung cancer screening. You may have this screening every year starting at age 40 if you have a 30-pack-year history of smoking and currently smoke or have quit within the past 15 years. Fecal occult blood test (FOBT) of the stool. You may have this test every year starting at age 48. Flexible sigmoidoscopy or colonoscopy. You may have a sigmoidoscopy every 5 years or a colonoscopy every 10 years starting at age 45. Prostate cancer screening. Recommendations will vary depending on your family history and other risks. Hepatitis C blood test. Hepatitis B blood test. Sexually transmitted disease (STD) testing. Diabetes screening. This is done by checking your blood sugar (glucose) after you have not eaten for a while (fasting). You may have this done every 1-3 years. Abdominal aortic aneurysm (AAA)  screening. You may need this if you are a current or former smoker. Osteoporosis. You may be screened starting at age 15 if you are at high risk. Talk with your health care provider about your test results, treatment options, and if necessary, the need for more tests. Vaccines  Your health care provider may recommend certain vaccines, such as: Influenza vaccine. This is recommended every year. Tetanus, diphtheria, and acellular pertussis (Tdap, Td) vaccine. You may need a Td booster every 10 years. Zoster vaccine. You may need this after age 34. Pneumococcal 13-valent conjugate (PCV13) vaccine. One dose is recommended after age 18. Pneumococcal polysaccharide (PPSV23) vaccine. One dose is recommended after age 21. Talk to your health care provider about which screenings and vaccines you need and how often you need them. This information is not intended to replace advice given to you by your health care provider. Make sure you discuss any questions you have with your health care provider. Document Released: 01/09/2016 Document Revised: 09/01/2016 Document Reviewed: 10/14/2015 Elsevier Interactive Patient Education  2017 [employer].  Fall Prevention in the Home Falls can cause injuries. They can happen to people of all ages. There are many things you can do to make your home safe and to help prevent falls. What can I do on the outside of my home? Regularly fix the edges of walkways and driveways and fix any cracks. Remove anything that might make you trip as you walk through a door, such as a raised step  or threshold. Trim any bushes or trees on the path to your home. Use bright outdoor lighting. Clear any walking paths of anything that might make someone trip, such as rocks or tools. Regularly check to see if handrails are loose or broken. Make sure that both sides of any steps have handrails. Any raised decks and porches should have guardrails on the edges. Have any leaves, snow, or ice  cleared regularly. Use sand or salt on walking paths during winter. Clean up any spills in your garage right away. This includes oil or grease spills. What can I do in the bathroom? Use night lights. Install grab bars by the toilet and in the tub and shower. Do not use towel bars as grab bars. Use non-skid mats or decals in the tub or shower. If you need to sit down in the shower, use a plastic, non-slip stool. Keep the floor dry. Clean up any water that spills on the floor as soon as it happens. Remove soap buildup in the tub or shower regularly. Attach bath mats securely with double-sided non-slip rug tape. Do not have throw rugs and other things on the floor that can make you trip. What can I do in the bedroom? Use night lights. Make sure that you have a light by your bed that is easy to reach. Do not use any sheets or blankets that are too big for your bed. They should not hang down onto the floor. Have a firm chair that has side arms. You can use this for support while you get dressed. Do not have throw rugs and other things on the floor that can make you trip. What can I do in the kitchen? Clean up any spills right away. Avoid walking on wet floors. Keep items that you use a lot in easy-to-reach places. If you need to reach something above you, use a strong step stool that has a grab bar. Keep electrical cords out of the way. Do not use floor polish or wax that makes floors slippery. If you must use wax, use non-skid floor wax. Do not have throw rugs and other things on the floor that can make you trip. What can I do with my stairs? Do not leave any items on the stairs. Make sure that there are handrails on both sides of the stairs and use them. Fix handrails that are broken or loose. Make sure that handrails are as long as the stairways. Check any carpeting to make sure that it is firmly attached to the stairs. Fix any carpet that is loose or worn. Avoid having throw rugs at the  top or bottom of the stairs. If you do have throw rugs, attach them to the floor with carpet tape. Make sure that you have a light switch at the top of the stairs and the bottom of the stairs. If you do not have them, ask someone to add them for you. What else can I do to help prevent falls? Wear shoes that: Do not have high heels. Have rubber bottoms. Are comfortable and fit you well. Are closed at the toe. Do not wear sandals. If you use a stepladder: Make sure that it is fully opened. Do not climb a closed stepladder. Make sure that both sides of the stepladder are locked into place. Ask someone to hold it for you, if possible. Clearly mark and make sure that you can see: Any grab bars or handrails. First and last steps. Where the edge of each step  is. Use tools that help you move around (mobility aids) if they are needed. These include: Canes. Walkers. Scooters. Crutches. Turn on the lights when you go into a dark area. Replace any light bulbs as soon as they burn out. Set up your furniture so you have a clear path. Avoid moving your furniture around. If any of your floors are uneven, fix them. If there are any pets around you, be aware of where they are. Review your medicines with your doctor. Some medicines can make you feel dizzy. This can increase your chance of falling. Ask your doctor what other things that you can do to help prevent falls. This information is not intended to replace advice given to you by your health care provider. Make sure you discuss any questions you have with your health care provider. Document Released: 10/09/2009 Document Revised: 05/20/2016 Document Reviewed: 01/17/2015 Elsevier Interactive Patient Education  2017 [employer].

## 2023-07-18 NOTE — Progress Notes (Signed)
Subjective:   Matthew Trujillo is a 72 y.o. male who presents for Medicare Annual/Subsequent preventive examination.  Visit Complete: Virtual  I connected with  Rodney Booze on 07/18/23 by a audio enabled telemedicine application and verified that I am speaking with the correct person using two identifiers.  Patient Location: Home  Provider Location: Home Office  I discussed the limitations of evaluation and management by telemedicine. The patient expressed understanding and agreed to proceed.  Patient Medicare AWV questionnaire was completed by the patient on 07/16/23; I have confirmed that all information answered by patient is correct and no changes since this date.  Review of Systems     Cardiac Risk Factors include: advanced age (>68men, >20 women);male gender;hypertension     Objective:    Today's Vitals   07/18/23 1433  Weight: 233 lb (105.7 kg)  Height: 6\' 2"  (1.88 m)   Body mass index is 29.92 kg/m.     07/18/2023    2:40 PM 05/24/2023    8:13 AM 07/14/2022   10:04 AM 05/18/2022    8:11 AM 07/10/2021   11:22 AM 02/18/2021    8:12 AM 12/08/2020    8:57 AM  Advanced Directives  Does Patient Have a Medical Advance Directive? Yes Yes Yes Yes Yes Yes Yes  Type of Estate agent of [place];Living will Living will Healthcare Power of [place];Living will Healthcare Power of [place];Living will;Out of facility DNR (pink MOST or yellow form) Healthcare Power of [place];Living will Healthcare Power of [place];Living will   Does patient want to make changes to medical advance directive?   No - Patient declined      Copy of Healthcare Power of Attorney in Chart? No - copy requested  No - copy requested  No - copy requested      Current Medications (verified) Outpatient Encounter Medications as of 07/18/2023  Medication Sig   albuterol (PROVENTIL) (2.5 MG/3ML) 0.083% nebulizer solution Take 3 mLs (2.5 mg total) by nebulization every 6 (six) hours as  needed for wheezing or shortness of breath.   albuterol (VENTOLIN HFA) 108 (90 Base) MCG/ACT inhaler INHALE 2 PUFFS INTO THE LUNGS EVERY 4 HOURS AS NEEDED FOR WHEEZE   amLODipine (NORVASC) 10 MG tablet TAKE 1 TABLET BY MOUTH EVERY DAY   apixaban (ELIQUIS) 5 MG TABS tablet Take 1 tablet (5 mg total) by mouth 2 (two) times daily.   Calcium Carbonate-Vitamin D 600-400 MG-UNIT tablet Take 1 tablet by mouth daily.   chlorthalidone (HYGROTON) 50 MG tablet Take 1 tablet (50 mg total) by mouth daily.   Cholecalciferol (VITAMIN D-3) 5000 units TABS Take 5,000 Units by mouth daily.   fluticasone-salmeterol (ADVAIR HFA) 115-21 MCG/ACT inhaler Inhale 2 puffs into the lungs 2 (two) times daily.   gabapentin (NEURONTIN) 300 MG capsule Take 1 capsule (300 mg total) by mouth 2 (two) times daily.   methimazole (TAPAZOLE) 5 MG tablet Take 1 tablet (5 mg total) by mouth daily.   Multiple Vitamin (MULTIVITAMIN) tablet Take 1 tablet by mouth daily.   Omega-3 Fatty Acids (FISH OIL) 1000 MG CAPS Take by mouth 2 (two) times daily.   ramipril (ALTACE) 10 MG capsule TAKE 1 CAPSULE BY MOUTH EVERY DAY   rosuvastatin (CRESTOR) 5 MG tablet TAKE 1 TABLET (5 MG TOTAL) BY MOUTH DAILY.   Spacer/Aero-Holding Chambers (E-Z SPACER) inhaler Use as instructed   No facility-administered encounter medications on file as of 07/18/2023.    Allergies (verified) Celebrex [celecoxib], Other, and Neosporin [bacitracin-polymyxin  b]   History: Past Medical History:  Diagnosis Date   ALLERGIC RHINITIS 05/21/2010   ASTHMA 05/21/2010   Asthma    Factor V Leiden (HCC)    Hx of adenomatous colonic polyps    HYPERTENSION 05/21/2010   Diffiult to control   Hyperthyroidism    Inguinal hernia    Ischemic colitis (HCC)    Lower GI bleed    Prolonged QT interval    PULMONARY EMBOLISM 05/21/2010   Umbilical hernia    Past Surgical History:  Procedure Laterality Date   ACHILLES TENDON REPAIR     COLONOSCOPY     last 2019, multiple    DOPPLER ECHOCARDIOGRAPHY  12/30/2011   EF >55%; MILD LVH,MILD LAE,NORMAL DIASTOLIC FUNCTION   RENAL DOPPLER  12/30/2011   NORMAL RENAL   UMBILICAL HERNIA REPAIR N/A 01/04/2020   Procedure: UMBILICAL HERNIA REPAIR WITH MESH;  Surgeon: Griselda Miner, MD;  Location: [facility];  Service: General;  Laterality: N/A;   Family History  Problem Relation Age of Onset   Kidney failure Father        single kidney; hx of recurrent infections   Renal cancer Father    Pancreatic cancer Brother    Hypertension Mother    Hypothyroidism Mother    Hypothyroidism Sister    Heart disease Neg Hx    Colon cancer Neg Hx    Esophageal cancer Neg Hx    Stomach cancer Neg Hx    Rectal cancer Neg Hx    Social History   Socioeconomic History   Marital status: Married    Spouse name: Not on file   Number of children: Not on file   Years of education: Not on file   Highest education level: Doctorate  Occupational History   Not on file  Tobacco Use   Smoking status: Never   Smokeless tobacco: Never  Vaping Use   Vaping status: Never Used  Substance and Sexual Activity   Alcohol use: Yes    Comment: 2-3 times/week   Drug use: No   Sexual activity: Not on file  Other Topics Concern   Not on file  Social History Narrative   He is a married father of 2. [employer] employee   He does exercise about 3-4 days a week with no true symptoms.    Does not smoke and occasional social alcohol.    Right handed   Social Determinants of Health   Financial Resource Strain: Low Risk  (07/18/2023)   Overall Financial Resource Strain (CARDIA)    Difficulty of Paying Living Expenses: Not hard at all  Food Insecurity: No Food Insecurity (07/18/2023)   Hunger Vital Sign    Worried About Running Out of Food in the Last Year: Never true    Ran Out of Food in the Last Year: Never true  Transportation Needs: No Transportation Needs (07/18/2023)   PRAPARE - Administrator, Civil Service  (Medical): No    Lack of Transportation (Non-Medical): No  Physical Activity: Inactive (07/18/2023)   Exercise Vital Sign    Days of Exercise per Week: 0 days    Minutes of Exercise per Session: 0 min  Stress: No Stress Concern Present (07/18/2023)   [employer] of Occupational Health - Occupational Stress Questionnaire    Feeling of Stress : Not at all  Social Connections: Moderately Isolated (07/18/2023)   Social Connection and Isolation Panel [NHANES]    Frequency of Communication with Friends and Family: More than  three times a week    Frequency of Social Gatherings with Friends and Family: More than three times a week    Attends Religious Services: Never    Database administrator or Organizations: No    Attends Engineer, structural: Never    Marital Status: Married    Tobacco Counseling Counseling given: Not Answered   Clinical Intake:  Pre-visit preparation completed: Yes  Pain : No/denies pain     BMI - recorded: 29.92 Nutritional Status: BMI 25 -29 Overweight Nutritional Risks: None Diabetes: No  How often do you need to have someone help you when you read instructions, pamphlets, or other written materials from your doctor or pharmacy?: 1 - Never  Interpreter Needed?: No  Information entered by :: Theresa Mulligan LPN   Activities of Daily Living    07/18/2023    2:39 PM 07/16/2023   10:56 AM  In your present state of health, do you have any difficulty performing the following activities:  Hearing? 0 0  Vision? 0 0  Difficulty concentrating or making decisions? 0 0  Walking or climbing stairs? 0 0  Dressing or bathing? 0 0  Doing errands, shopping? 0 0  Preparing Food and eating ? N N  Using the Toilet? N N  In the past six months, have you accidently leaked urine? N N  Do you have problems with loss of bowel control? N N  Managing your Medications? N N  Managing your Finances? N N  Housekeeping or managing your Housekeeping? N N     Patient Care Team: Karie Georges, MD as PCP - General (Family Medicine) Tat, Octaviano Batty, DO as Consulting Physician (Neurology)  Indicate any recent Medical Services you may have received from other than [facility] providers in the past year (date may be approximate).     Assessment:   This is a routine wellness examination for [facility].  Hearing/Vision screen Hearing Screening - Comments:: Denies hearing difficulties   Vision Screening - Comments:: Wears rx glasses - up to date with routine eye exams with  [facility]  Dietary issues and exercise activities discussed:     Goals Addressed               This Visit's Progress     Increase physical activity (pt-stated)         Depression Screen    07/18/2023    2:38 PM 11/15/2022    2:31 PM 07/14/2022   10:00 AM 06/16/2022   11:26 AM 04/12/2022    2:05 PM 11/30/2021    2:30 PM 07/10/2021   11:25 AM  PHQ 2/9 Scores  PHQ - 2 Score 0 0 0 0 0 0 0  PHQ- 9 Score  0 0 2 2 2      Fall Risk    07/18/2023    2:39 PM 07/16/2023   10:56 AM 05/24/2023    8:13 AM 11/15/2022    2:32 PM 07/14/2022   10:03 AM  Fall Risk   Falls in the past year? 0 0 0 0 0  Number falls in past yr: 0  0 0 0  Injury with Fall? 0  0 0 0  Risk for fall due to : No Fall Risks   No Fall Risks No Fall Risks  Follow up Falls prevention discussed  Falls evaluation completed Falls evaluation completed     MEDICARE RISK AT HOME:  Medicare Risk at Home - 07/18/23 1443     Any  stairs in or around the home? Yes    If so, are there any without handrails? No    Home free of loose throw rugs in walkways, pet beds, electrical cords, etc? Yes    Adequate lighting in your home to reduce risk of falls? Yes    Life alert? No    Use of a cane, walker or w/c? No    Grab bars in the bathroom? Yes    Shower chair or bench in shower? Yes    Elevated toilet seat or a handicapped toilet? Yes             TIMED UP AND GO:  Was the test performed?  No     Cognitive Function:        07/18/2023    2:40 PM 07/14/2022   10:04 AM  6CIT Screen  What Year? 0 points 0 points  What month? 0 points 0 points  What time? 0 points 0 points  Count back from 20 0 points 0 points  Months in reverse 0 points 0 points  Repeat phrase 0 points 0 points  Total Score 0 points 0 points    Immunizations Immunization History  Administered Date(s) Administered   Fluad Quad(high Dose 65+) 08/22/2019   Influenza Whole 09/29/2010   Influenza, High Dose Seasonal PF 10/05/2018   Influenza-Unspecified 10/12/2017, 08/27/2020, 10/26/2021, 09/17/2022   PFIZER(Purple Top)SARS-COV-2 Vaccination 01/28/2020, 02/17/2020, 09/26/2020, 06/25/2021   PNEUMOCOCCAL CONJUGATE-20 11/30/2021   Pfizer Covid-19 Vaccine Bivalent Booster 94yrs & up 12/09/2021   Pfizer Covid-19 Vaccine Bivalent Booster 5y-11y 11/08/2022   Pneumococcal Conjugate-13 08/16/2018   Pneumococcal Polysaccharide-23 08/22/2019   Tdap 09/15/2019   Zoster Recombinant(Shingrix) 08/24/2018, 03/12/2019    TDAP status: Up to date  Flu Vaccine status: Up to date  Pneumococcal vaccine status: Up to date  Covid-19 vaccine status: Completed vaccines  Qualifies for Shingles Vaccine? Yes   Zostavax completed Yes   Shingrix Completed?: Yes  Screening Tests Health Maintenance  Topic Date Due   COVID-19 Vaccine (7 - 2023-24 season) 08/03/2023 (Originally 01/03/2023)   INFLUENZA VACCINE  07/28/2023   Colonoscopy  11/21/2023   Medicare Annual Wellness (AWV)  07/17/2024   DTaP/Tdap/Td (2 - Td or Tdap) 09/14/2029   Pneumonia Vaccine 35+ Years old  Completed   Hepatitis C Screening  Completed   Zoster Vaccines- Shingrix  Completed   HPV VACCINES  Aged Out    Health Maintenance  There are no preventive care reminders to display for this patient.   Colorectal cancer screening: Type of screening: Colonoscopy. Completed 11/20/18. Repeat every 5 years  Lung Cancer Screening: (Low Dose CT Chest recommended  if Age 26-80 years, 20 pack-year currently smoking OR have quit w/in 15years.) does not qualify.     Additional Screening:  Hepatitis C Screening: does qualify; Completed 04/25/18  Vision Screening: Recommended annual ophthalmology exams for early detection of glaucoma and other disorders of the eye. Is the patient up to date with their annual eye exam?  Yes  Who is the provider or what is the name of the office in which the patient attends annual eye exams? [facility] If pt is not established with a provider, would they like to be referred to a provider to establish care? No .   Dental Screening: Recommended annual dental exams for proper oral hygiene   Community Resource Referral / Chronic Care Management: CRR required this visit?  No   CCM required this visit?  No     Plan:  I have personally reviewed and noted the following in the patient's chart:   Medical and social history Use of alcohol, tobacco or illicit drugs  Current medications and supplements including opioid prescriptions. Patient is not currently taking opioid prescriptions. Functional ability and status Nutritional status Physical activity Advanced directives List of other physicians Hospitalizations, surgeries, and ER visits in previous 12 months Vitals Screenings to include cognitive, depression, and falls Referrals and appointments  In addition, I have reviewed and discussed with patient certain preventive protocols, quality metrics, and best practice recommendations. A written personalized care plan for preventive services as well as general preventive health recommendations were provided to patient.     Tillie Rung, LPN   06/25/5283   After Visit Summary: (MyChart) Due to this being a telephonic visit, the after visit summary with patients personalized plan was offered to patient via MyChart   Nurse Notes: None

## 2023-09-14 DIAGNOSIS — R051 Acute cough: Secondary | ICD-10-CM | POA: Diagnosis not present

## 2023-09-14 DIAGNOSIS — J209 Acute bronchitis, unspecified: Secondary | ICD-10-CM | POA: Diagnosis not present

## 2023-09-14 DIAGNOSIS — Z03818 Encounter for observation for suspected exposure to other biological agents ruled out: Secondary | ICD-10-CM | POA: Diagnosis not present

## 2023-09-14 DIAGNOSIS — L03119 Cellulitis of unspecified part of limb: Secondary | ICD-10-CM | POA: Diagnosis not present

## 2023-09-14 DIAGNOSIS — J45901 Unspecified asthma with (acute) exacerbation: Secondary | ICD-10-CM | POA: Diagnosis not present

## 2023-09-22 ENCOUNTER — Ambulatory Visit (INDEPENDENT_AMBULATORY_CARE_PROVIDER_SITE_OTHER): Payer: Medicare HMO | Admitting: Family Medicine

## 2023-09-22 ENCOUNTER — Encounter: Payer: Self-pay | Admitting: Family Medicine

## 2023-09-22 VITALS — BP 132/68 | HR 75 | Temp 98.6°F | Ht 74.0 in | Wt 227.9 lb

## 2023-09-22 DIAGNOSIS — J45901 Unspecified asthma with (acute) exacerbation: Secondary | ICD-10-CM

## 2023-09-22 DIAGNOSIS — D6851 Activated protein C resistance: Secondary | ICD-10-CM

## 2023-09-22 MED ORDER — GUAIFENESIN-CODEINE 100-10 MG/5ML PO SOLN
10.0000 mL | Freq: Four times a day (QID) | ORAL | 0 refills | Status: AC | PRN
Start: 2023-09-22 — End: ?

## 2023-09-22 MED ORDER — PREDNISONE 20 MG PO TABS
ORAL_TABLET | ORAL | 0 refills | Status: AC
Start: 2023-09-22 — End: 2023-10-04

## 2023-09-22 MED ORDER — EDOXABAN TOSYLATE 60 MG PO TABS
60.0000 mg | ORAL_TABLET | Freq: Every day | ORAL | 3 refills | Status: DC
Start: 2023-09-22 — End: 2024-03-26

## 2023-09-23 ENCOUNTER — Other Ambulatory Visit: Payer: Self-pay | Admitting: Family Medicine

## 2023-09-23 ENCOUNTER — Telehealth: Payer: Self-pay

## 2023-09-23 DIAGNOSIS — E785 Hyperlipidemia, unspecified: Secondary | ICD-10-CM

## 2023-09-23 MED ORDER — GUAIFENESIN-CODEINE 100-10 MG/5ML PO SOLN: 10.0000 mL | Freq: Four times a day (QID) | ORAL | 0 refills | Status: DC | PRN

## 2023-09-23 NOTE — Telephone Encounter (Signed)
I sent this to [facility] Mamie Levers MD [facility]

## 2023-09-23 NOTE — Progress Notes (Signed)
   09/23/2023  Patient ID: Matthew Trujillo, male   DOB: 08/27/1951, 72 y.o.   MRN: [id number]  Submitted prior authorization for Savaysa through CoverMyMeds. Key is [id number].  Approved for $320 for 90 day supply. Patient is aware of cost and will pick up.  Sherrill Raring, PharmD Clinical Pharmacist [phone]

## 2023-09-27 ENCOUNTER — Encounter: Payer: Self-pay | Admitting: Family Medicine

## 2023-09-27 DIAGNOSIS — G250 Essential tremor: Secondary | ICD-10-CM

## 2023-09-27 MED ORDER — PRIMIDONE 50 MG PO TABS
50.0000 mg | ORAL_TABLET | Freq: Two times a day (BID) | ORAL | 1 refills | Status: DC
Start: 2023-09-27 — End: 2024-01-10

## 2023-10-01 ENCOUNTER — Other Ambulatory Visit: Payer: Self-pay | Admitting: Cardiology

## 2023-11-01 ENCOUNTER — Encounter: Payer: Self-pay | Admitting: Internal Medicine

## 2023-11-03 ENCOUNTER — Other Ambulatory Visit: Payer: Self-pay | Admitting: Family Medicine

## 2023-11-03 DIAGNOSIS — I10 Essential (primary) hypertension: Secondary | ICD-10-CM

## 2023-11-28 NOTE — Progress Notes (Unsigned)
Assessment/Plan:    1.  Essential Tremor   -Patient went back on primidone after the Eliquis was stopped and he was able to get back to savaysa.  He is back on primidone, 50 mg twice per day.  His primary care physician is prescribing this.  -he asked about focused ultrasound and we discussed this in detail.  Discussed r/b.  I don't think he needs it right now and he doesn't disagree.  He worries about what happens when/if savaysa isn't available to him again and he has to go back to eliquis.  Its a legitimate concern and we can certainly look at focused ultrasound if that is the came.  -He has been told he could not take beta-blocker because of his asthma.  He tried metoprolol in the past and was taken off of it because of his asthma. -Patient has been on topiramate in the past with side effects -Discussed [employer].  2.  Hyperthyroidism  -Follows with Dr. Everardo All.  Reportedly well controlled with methimazole.  3.  Factor V Leiden  -Had to discontinue savaysa and is now on Eliquis   4.  Since primary care is prescribing his medication, he can follow-up on an as-needed basis (his choice), as long as he is doing well.   Subjective:   Matthew Trujillo was seen today in follow up for essential tremor.  My previous records were reviewed prior to todays visit.  I stopped the patient's primidone last visit because of the fact that he had gone back to Eliquis.  He did not have a lot of options.  We decided to start gabapentin, 300 mg twice per day.  He did not find this particularly effective and he had significant LE edema with it.  Notes from primary care at the end of September indicate that the patient was trying to get back to savaysa because of this.  [name] was actually approved by his insurance on September 27, but it was $320 for 90-day supply.  The patient ended up going back on savaysa, and he reports today that he went back on the primidone as well.  It appears that his primary care  physician is now prescribing the primidone.  He feels that he is 80% better.    PREVIOUS MEDICATIONS: Metoprolol (taken off in the past because of asthma); topiramate (cognitive change and depression); primidone (d/c b/c of eliquis interaction); gabapentin 300 mg twice per day (had LE edema with it)  ALLERGIES:   Allergies  Allergen Reactions   Celebrex [Celecoxib]     Blood in stool   Other     Animal dander causes sneezing and watery eyes   Neosporin [Bacitracin-Polymyxin B] Rash    CURRENT MEDICATIONS:  Outpatient Encounter Medications as of 11/30/2023  Medication Sig   albuterol (PROVENTIL) (2.5 MG/3ML) 0.083% nebulizer solution Take 3 mLs (2.5 mg total) by nebulization every 6 (six) hours as needed for wheezing or shortness of breath.   albuterol (VENTOLIN HFA) 108 (90 Base) MCG/ACT inhaler INHALE 2 PUFFS INTO THE LUNGS EVERY 4 HOURS AS NEEDED FOR WHEEZE   amLODipine (NORVASC) 10 MG tablet TAKE 1 TABLET BY MOUTH EVERY DAY   Calcium Carbonate-Vitamin D 600-400 MG-UNIT tablet Take 1 tablet by mouth daily.   chlorthalidone (HYGROTON) 50 MG tablet TAKE 1 TABLET BY MOUTH EVERY DAY   Cholecalciferol (VITAMIN D-3) 5000 units TABS Take 5,000 Units by mouth daily.   edoxaban (SAVAYSA) 60 MG TABS tablet Take 60 mg by mouth daily.  fluticasone-salmeterol (ADVAIR HFA) 115-21 MCG/ACT inhaler Inhale 2 puffs into the lungs 2 (two) times daily.   guaiFENesin-codeine 100-10 MG/5ML syrup Take 10 mLs by mouth every 6 (six) hours as needed for cough.   methimazole (TAPAZOLE) 5 MG tablet Take 1 tablet (5 mg total) by mouth daily.   Multiple Vitamin (MULTIVITAMIN) tablet Take 1 tablet by mouth daily.   Omega-3 Fatty Acids (FISH OIL) 1000 MG CAPS Take by mouth 2 (two) times daily.   primidone (MYSOLINE) 50 MG tablet Take 1 tablet (50 mg total) by mouth in the morning and at bedtime.   ramipril (ALTACE) 10 MG capsule TAKE 1 CAPSULE BY MOUTH EVERY DAY   rosuvastatin (CRESTOR) 5 MG tablet TAKE 1 TABLET (5  MG TOTAL) BY MOUTH DAILY.   Spacer/Aero-Holding Chambers (E-Z SPACER) inhaler Use as instructed   [DISCONTINUED] ELIQUIS 5 MG TABS tablet TAKE 1 TABLET BY MOUTH TWICE A DAY (Patient not taking: Reported on 11/30/2023)   No facility-administered encounter medications on file as of 11/30/2023.     Objective:    PHYSICAL EXAMINATION:    VITALS:   Vitals:   11/30/23 1107  BP: 132/72  Pulse: 81  SpO2: 99%  Weight: 231 lb 6.4 oz (105 kg)  Height: 6\' 2"  (1.88 m)     GEN:  The patient appears stated age and is in NAD. HEENT:  Normocephalic, atraumatic.  The mucous membranes are moist. The superficial temporal arteries are without ropiness or tenderness. CV:  RRR Lungs:  CTAB Neck/HEME:  There are no carotid bruits bilaterally.  Neurological examination:  Orientation: The patient is alert and oriented x3. Cranial nerves: There is good facial symmetry. The speech is fluent and clear. Soft palate rises symmetrically and there is no tongue deviation. Hearing is intact to conversational tone. Sensation: Sensation is intact to light touch throughout Motor: Strength is at least antigravity x4.  Movement examination: Tone: There is normal tone in the UE/LE Abnormal movements: no rest tremor.  Mild postural tremor, R>L.  Archimedes spirals are good today, without significant tremor.  Coordination:  There is no decremation with RAM's  I have reviewed and interpreted the following labs independently   Chemistry      Component Value Date/Time   NA 136 11/23/2022 0834   K 3.7 11/23/2022 0834   CL 99 11/23/2022 0834   CO2 29 11/23/2022 0834   BUN 22 11/23/2022 0834   CREATININE 0.91 11/23/2022 0834   CREATININE 0.91 11/24/2020 0932      Component Value Date/Time   CALCIUM 9.8 11/23/2022 0834   ALKPHOS 46 11/23/2022 0834   AST 25 11/23/2022 0834   ALT 37 11/23/2022 0834   BILITOT 0.6 11/23/2022 0834      Lab Results  Component Value Date   WBC 9.8 12/01/2021   HGB 15.5  12/01/2021   HCT 45.8 12/01/2021   MCV 90.8 12/01/2021   PLT 308.0 12/01/2021   Lab Results  Component Value Date   TSH 3.59 11/23/2022     Chemistry      Component Value Date/Time   NA 136 11/23/2022 0834   K 3.7 11/23/2022 0834   CL 99 11/23/2022 0834   CO2 29 11/23/2022 0834   BUN 22 11/23/2022 0834   CREATININE 0.91 11/23/2022 0834   CREATININE 0.91 11/24/2020 0932      Component Value Date/Time   CALCIUM 9.8 11/23/2022 0834   ALKPHOS 46 11/23/2022 0834   AST 25 11/23/2022 0834   ALT 37 11/23/2022 0834  BILITOT 0.6 11/23/2022 0834     Total time spent on today's visit was 33 minutes, including both face-to-face time and nonface-to-face time.  Time included that spent on review of records (prior notes available to me/labs/imaging if pertinent), discussing treatment and goals, answering patient's questions and coordinating care.   Cc:  Karie Georges, MD

## 2023-11-30 ENCOUNTER — Encounter: Payer: Self-pay | Admitting: Neurology

## 2023-11-30 ENCOUNTER — Ambulatory Visit: Payer: Medicare HMO | Admitting: Neurology

## 2023-11-30 VITALS — BP 132/72 | HR 81 | Ht 74.0 in | Wt 231.4 lb

## 2023-11-30 DIAGNOSIS — G250 Essential tremor: Secondary | ICD-10-CM

## 2023-12-02 ENCOUNTER — Encounter: Payer: Self-pay | Admitting: Family Medicine

## 2023-12-02 ENCOUNTER — Ambulatory Visit (INDEPENDENT_AMBULATORY_CARE_PROVIDER_SITE_OTHER): Payer: Medicare HMO | Admitting: Family Medicine

## 2023-12-02 ENCOUNTER — Other Ambulatory Visit: Payer: Self-pay | Admitting: Family Medicine

## 2023-12-02 VITALS — BP 150/80 | HR 75 | Temp 97.9°F | Ht 72.84 in | Wt 230.6 lb

## 2023-12-02 DIAGNOSIS — Z0000 Encounter for general adult medical examination without abnormal findings: Secondary | ICD-10-CM

## 2023-12-02 DIAGNOSIS — D6851 Activated protein C resistance: Secondary | ICD-10-CM

## 2023-12-02 DIAGNOSIS — J45909 Unspecified asthma, uncomplicated: Secondary | ICD-10-CM

## 2023-12-02 DIAGNOSIS — E0590 Thyrotoxicosis, unspecified without thyrotoxic crisis or storm: Secondary | ICD-10-CM | POA: Diagnosis not present

## 2023-12-02 DIAGNOSIS — E785 Hyperlipidemia, unspecified: Secondary | ICD-10-CM

## 2023-12-02 DIAGNOSIS — I10 Essential (primary) hypertension: Secondary | ICD-10-CM | POA: Diagnosis not present

## 2023-12-02 LAB — LIPID PANEL
Cholesterol: 150 mg/dL (ref 0–200)
HDL: 37.9 mg/dL — ABNORMAL LOW (ref >39.00)
LDL Cholesterol: 84 mg/dL (ref 0–99)
NonHDL: 112.05
Total CHOL/HDL Ratio: 4
Triglycerides: 140 mg/dL (ref 0.0–149.0)
VLDL: 28 mg/dL (ref 0.0–40.0)

## 2023-12-02 LAB — CBC WITH DIFFERENTIAL/PLATELET
Basophils Absolute: 0.1 10*3/uL (ref 0.0–0.1)
Basophils Relative: 0.8 % (ref 0.0–3.0)
Eosinophils Absolute: 0.2 10*3/uL (ref 0.0–0.7)
Eosinophils Relative: 1.7 % (ref 0.0–5.0)
HCT: 48.5 % (ref 39.0–52.0)
Hemoglobin: 17 g/dL (ref 13.0–17.0)
Lymphocytes Relative: 34.1 % (ref 12.0–46.0)
Lymphs Abs: 4 10*3/uL (ref 0.7–4.0)
MCHC: 35 g/dL (ref 30.0–36.0)
MCV: 91.6 fL (ref 78.0–100.0)
Monocytes Absolute: 1.1 10*3/uL — ABNORMAL HIGH (ref 0.1–1.0)
Monocytes Relative: 9.7 % (ref 3.0–12.0)
Neutro Abs: 6.2 10*3/uL (ref 1.4–7.7)
Neutrophils Relative %: 53.7 % (ref 43.0–77.0)
Platelets: 323 10*3/uL (ref 150.0–400.0)
RBC: 5.29 Mil/uL (ref 4.22–5.81)
RDW: 14.9 % (ref 11.5–15.5)
WBC: 11.6 10*3/uL — ABNORMAL HIGH (ref 4.0–10.5)

## 2023-12-02 LAB — COMPREHENSIVE METABOLIC PANEL
ALT: 46 U/L (ref 0–53)
AST: 31 U/L (ref 0–37)
Albumin: 4.9 g/dL (ref 3.5–5.2)
Alkaline Phosphatase: 56 U/L (ref 39–117)
BUN: 17 mg/dL (ref 6–23)
CO2: 30 meq/L (ref 19–32)
Calcium: 9.6 mg/dL (ref 8.4–10.5)
Chloride: 97 meq/L (ref 96–112)
Creatinine, Ser: 0.92 mg/dL (ref 0.40–1.50)
GFR: 84.32 mL/min (ref >60.00)
Glucose, Bld: 98 mg/dL (ref 70–99)
Potassium: 3.8 meq/L (ref 3.5–5.1)
Sodium: 136 meq/L (ref 135–145)
Total Bilirubin: 1 mg/dL (ref 0.2–1.2)
Total Protein: 7.4 g/dL (ref 6.0–8.3)

## 2023-12-02 LAB — TSH: TSH: 3.52 u[IU]/mL (ref 0.35–5.50)

## 2023-12-02 MED ORDER — ALBUTEROL SULFATE (2.5 MG/3ML) 0.083% IN NEBU: 2.5000 mg | INHALATION_SOLUTION | Freq: Four times a day (QID) | RESPIRATORY_TRACT | 12 refills | Status: AC | PRN

## 2023-12-02 MED ORDER — ROSUVASTATIN CALCIUM 5 MG PO TABS: 5.0000 mg | ORAL_TABLET | Freq: Every day | ORAL | 1 refills | Status: DC

## 2023-12-02 MED ORDER — FLUTICASONE-SALMETEROL 115-21 MCG/ACT IN AERO: 2.0000 | INHALATION_SPRAY | Freq: Two times a day (BID) | RESPIRATORY_TRACT | 12 refills | Status: DC

## 2023-12-02 MED ORDER — METHIMAZOLE 5 MG PO TABS: 5.0000 mg | ORAL_TABLET | Freq: Every day | ORAL | 3 refills | Status: DC

## 2023-12-02 NOTE — Patient Instructions (Addendum)
[employer] or [employer] brand of blood pressure cuff -- start monitoring your blood pressure at home a couple times a week -- goal blood pressure is anything less than 140/90-- let me know if your pressures at home are elevated  Health Maintenance, Male Adopting a healthy lifestyle and getting preventive care are important in promoting health and wellness. Ask your health care provider about: The right schedule for you to have regular tests and exams. Things you can do on your own to prevent diseases and keep yourself healthy. What should I know about diet, weight, and exercise? Eat a healthy diet  Eat a diet that includes plenty of vegetables, fruits, low-fat dairy products, and lean protein. Do not eat a lot of foods that are high in solid fats, added sugars, or sodium. Maintain a healthy weight Body mass index (BMI) is a measurement that can be used to identify possible weight problems. It estimates body fat based on height and weight. Your health care provider can help determine your BMI and help you achieve or maintain a healthy weight. Get regular exercise Get regular exercise. This is one of the most important things you can do for your health. Most adults should: Exercise for at least 150 minutes each week. The exercise should increase your heart rate and make you sweat (moderate-intensity exercise). Do strengthening exercises at least twice a week. This is in addition to the moderate-intensity exercise. Spend less time sitting. Even light physical activity can be beneficial. Watch cholesterol and blood lipids Have your blood tested for lipids and cholesterol at 70 years of age, then have this test every 5 years. You may need to have your cholesterol levels checked more often if: Your lipid or cholesterol levels are high. You are older than 70 years of age. You are at high risk for heart disease. What should I know about cancer screening? Many types of cancers can be detected early and  may often be prevented. Depending on your health history and family history, you may need to have cancer screening at various ages. This may include screening for: Colorectal cancer. Prostate cancer. Skin cancer. Lung cancer. What should I know about heart disease, diabetes, and high blood pressure? Blood pressure and heart disease High blood pressure causes heart disease and increases the risk of stroke. This is more likely to develop in people who have high blood pressure readings or are overweight. Talk with your health care provider about your target blood pressure readings. Have your blood pressure checked: Every 3-5 years if you are 64-55 years of age. Every year if you are 78 years old or older. If you are between the ages of 54 and 47 and are a current or former smoker, ask your health care provider if you should have a one-time screening for abdominal aortic aneurysm (AAA). Diabetes Have regular diabetes screenings. This checks your fasting blood sugar level. Have the screening done: Once every three years after age 31 if you are at a normal weight and have a low risk for diabetes. More often and at a younger age if you are overweight or have a high risk for diabetes. What should I know about preventing infection? Hepatitis B If you have a higher risk for hepatitis B, you should be screened for this virus. Talk with your health care provider to find out if you are at risk for hepatitis B infection. Hepatitis C Blood testing is recommended for: Everyone born from [birth year] through 1963. Anyone with known risk factors  for hepatitis C. Sexually transmitted infections (STIs) You should be screened each year for STIs, including gonorrhea and chlamydia, if: You are sexually active and are younger than 70 years of age. You are older than 69 years of age and your health care provider tells you that you are at risk for this type of infection. Your sexual activity has changed since you were  last screened, and you are at increased risk for chlamydia or gonorrhea. Ask your health care provider if you are at risk. Ask your health care provider about whether you are at high risk for HIV. Your health care provider may recommend a prescription medicine to help prevent HIV infection. If you choose to take medicine to prevent HIV, you should first get tested for HIV. You should then be tested every 3 months for as long as you are taking the medicine. Follow these instructions at home: Alcohol use Do not drink alcohol if your health care provider tells you not to drink. If you drink alcohol: Limit how much you have to 0-2 drinks a day. Know how much alcohol is in your drink. In the [place] one drink equals one 12 oz bottle of beer (355 mL), one 5 oz glass of wine (148 mL), or one 1 oz glass of hard liquor (44 mL). Lifestyle Do not use any products that contain nicotine or tobacco. These products include cigarettes, chewing tobacco, and vaping devices, such as e-cigarettes. If you need help quitting, ask your health care provider. Do not use street drugs. Do not share needles. Ask your health care provider for help if you need support or information about quitting drugs. General instructions Schedule regular health, dental, and eye exams. Stay current with your vaccines. Tell your health care provider if: You often feel depressed. You have ever been abused or do not feel safe at home. Summary Adopting a healthy lifestyle and getting preventive care are important in promoting health and wellness. Follow your health care provider's instructions about healthy diet, exercising, and getting tested or screened for diseases. Follow your health care provider's instructions on monitoring your cholesterol and blood pressure. This information is not intended to replace advice given to you by your health care provider. Make sure you discuss any questions you have with your health care  provider. Document Revised: 05/04/2021 Document Reviewed: 05/04/2021 Elsevier Patient Education  2024 [employer].

## 2023-12-02 NOTE — Progress Notes (Unsigned)
Complete physical exam  Patient: Matthew Trujillo   DOB: November 20, 1951   72 y.o. Male  MRN: [id number]  Subjective:    Chief Complaint  Patient presents with  . Annual Exam    Matthew Trujillo is a 72 y.o. male who presents today for a complete physical exam. He reports consuming a general diet. Home exercise routine includes walking 0.5 hrs per week. He generally feels well. He reports sleeping well. He does not have additional problems to discuss today.   Pt has GI appt schedule in February for his colonoscopy screening. Most recent fall risk assessment:    11/30/2023   11:08 AM  Fall Risk   Falls in the past year? 0  Number falls in past yr: 0  Injury with Fall? 0  Follow up Falls evaluation completed     Most recent depression screenings:    07/18/2023    2:38 PM 11/15/2022    2:31 PM  PHQ 2/9 Scores  PHQ - 2 Score 0 0  PHQ- 9 Score  0    Vision:Not within last year  and no vision issues curently. and Dental: No current dental problems and Receives regular dental care  Patient Active Problem List   Diagnosis Date Noted  . Asthma 04/12/2022  . GI bleed 08/08/2019  . Abdominal pain 08/08/2019  . Factor V Leiden (HCC) 08/08/2019  . Ischemic colitis (HCC)   . 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 Hyperlipidemia with target LDL less than 100 05/04/2018  . High risk medication use 11/11/2017  . Graves disease 10/26/2017  . 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 Palpitations 09/01/2017  . Hyperthyroidism 08/30/2017  . Bilateral edema of lower extremity 08/30/2017  . Essential hypertension 05/21/2010  . History of pulmonary embolism 05/21/2010  . Allergic rhinitis 05/21/2010      Patient Care Team: Karie Georges, MD as PCP - General (Family Medicine) Tat, Octaviano Batty, DO as Consulting Physician (Neurology)   Outpatient Medications Prior to Visit  Medication Sig  . albuterol (VENTOLIN HFA) 108 (90 Base)  MCG/ACT inhaler INHALE 2 PUFFS INTO THE LUNGS EVERY 4 HOURS AS NEEDED FOR WHEEZE  . amLODipine (NORVASC) 10 MG tablet TAKE 1 TABLET BY MOUTH EVERY DAY  . Calcium Carbonate-Vitamin D 600-400 MG-UNIT tablet Take 1 tablet by mouth daily.  . chlorthalidone (HYGROTON) 50 MG tablet TAKE 1 TABLET BY MOUTH EVERY DAY  . Cholecalciferol (VITAMIN D-3) 5000 units TABS Take 5,000 Units by mouth daily.  Marland Kitchen edoxaban (SAVAYSA) 60 MG TABS tablet Take 60 mg by mouth daily.  Marland Kitchen guaiFENesin-codeine 100-10 MG/5ML syrup Take 10 mLs by mouth every 6 (six) hours as needed for cough.  . Multiple Vitamin (MULTIVITAMIN) tablet Take 1 tablet by mouth daily.  . Omega-3 Fatty Acids (FISH OIL) 1000 MG CAPS Take by mouth 2 (two) times daily.  . primidone (MYSOLINE) 50 MG tablet Take 1 tablet (50 mg total) by mouth in the morning and at bedtime.  . ramipril (ALTACE) 10 MG capsule TAKE 1 CAPSULE BY MOUTH EVERY DAY  . Spacer/Aero-Holding Chambers (E-Z SPACER) inhaler Use as instructed  . [DISCONTINUED] albuterol (PROVENTIL) (2.5 MG/3ML) 0.083% nebulizer solution Take 3 mLs (2.5 mg total) by nebulization every 6 (six) hours as needed for wheezing or shortness of breath.  . [DISCONTINUED] fluticasone-salmeterol (ADVAIR HFA) 115-21 MCG/ACT inhaler Inhale 2 puffs into the lungs 2 (two) times daily.  . [DISCONTINUED] methimazole (TAPAZOLE) 5 MG tablet Take 1  tablet (5 mg total) by mouth daily.  . [DISCONTINUED] rosuvastatin (CRESTOR) 5 MG tablet TAKE 1 TABLET (5 MG TOTAL) BY MOUTH DAILY.   No facility-administered medications prior to visit.    Review of Systems  HENT:  Negative for hearing loss.   Eyes:  Negative for blurred vision.  Respiratory:  Negative for shortness of breath.   Cardiovascular:  Negative for chest pain.  Gastrointestinal: Negative.   Genitourinary: Negative.   Musculoskeletal:  Negative for back pain.  Neurological:  Negative for headaches.  Psychiatric/Behavioral:  Negative for depression.         Objective:     BP (!) 150/80 (BP Location: Left Arm, Patient Position: Sitting, Cuff Size: Normal)   Pulse 75   Temp 97.9 F (36.6 C) (Oral)   Ht 6' 0.84" (1.85 m)   Wt 230 lb 9.6 oz (104.6 kg)   SpO2 99%   BMI 30.56 kg/m  {Vitals History (Optional):23777}  Physical Exam Vitals reviewed.  Constitutional:      Appearance: Normal appearance. He is well-groomed and normal weight.  HENT:     Right Ear: Tympanic membrane and ear canal normal.     Left Ear: Tympanic membrane and ear canal normal.     Mouth/Throat:     Mouth: Mucous membranes are moist.     Pharynx: No posterior oropharyngeal erythema.  Eyes:     Extraocular Movements: Extraocular movements intact.     Conjunctiva/sclera: Conjunctivae normal.  Neck:     Thyroid: No thyromegaly.  Cardiovascular:     Rate and Rhythm: Normal rate and regular rhythm.     Heart sounds: S1 normal and S2 normal. No murmur heard. Pulmonary:     Effort: Pulmonary effort is normal.     Breath sounds: Normal breath sounds and air entry. No rales.  Abdominal:     General: Abdomen is flat. Bowel sounds are normal.  Musculoskeletal:     Right lower leg: No edema.     Left lower leg: No edema.  Lymphadenopathy:     Cervical: No cervical adenopathy.  Neurological:     General: No focal deficit present.     Mental Status: He is alert and oriented to person, place, and time.     Gait: Gait is intact.  Psychiatric:        Mood and Affect: Mood and affect normal.     No results found for any visits on 12/02/23. {Show previous labs (optional):23779}    Assessment & Plan:    Routine Health Maintenance and Physical Exam  Immunization History  Administered Date(s) Administered  . Fluad Quad(high Dose 65+) 08/22/2019  . Influenza Whole 09/29/2010  . Influenza, High Dose Seasonal PF 10/05/2018, 08/22/2023  . Influenza-Unspecified 10/12/2017, 08/27/2020, 10/26/2021, 09/17/2022  . Moderna Covid-19 Vaccine Bivalent Booster 42yrs & up  08/22/2023  . PFIZER(Purple Top)SARS-COV-2 Vaccination 01/28/2020, 02/17/2020, 09/26/2020, 06/25/2021  . PNEUMOCOCCAL CONJUGATE-20 11/30/2021  . [employer] officer, [employer] 40yrs & up 12/09/2021  . Research officer, trade [employer] [id number] 11/08/2022  . Pneumococcal Conjugate-13 08/16/2018  . Pneumococcal Polysaccharide-23 08/22/2019  . Tdap 09/15/2019  . Zoster Recombinant(Shingrix) 08/24/2018, 03/12/2019    Health Maintenance  Topic Date Due  . COVID-19 Vaccine (8 - 2023-24 season) 10/17/2023  . Colonoscopy  11/21/2023  . Medicare Annual Wellness (AWV)  07/17/2024  . DTaP/Tdap/Td (2 - Td or Tdap) 09/14/2029  . Pneumonia Vaccine 79+ Years old  Completed  . INFLUENZA VACCINE  Completed  . Hepatitis C Screening  Completed  . Zoster Vaccines- Shingrix  Completed  . HPV VACCINES  Aged Out    Discussed health benefits of physical activity, and encouraged him to engage in regular exercise appropriate for his age and condition.  Essential hypertension -     Comprehensive metabolic panel  Moderate asthma without complication, unspecified whether persistent -     Fluticasone-Salmeterol; Inhale 2 puffs into the lungs 2 (two) times daily.  Dispense: 1 each; Refill: 12  Hyperthyroidism -     methIMAzole; Take 1 tablet (5 mg total) by mouth daily.  Dispense: 90 tablet; Refill: 3 -     TSH  Hyperlipidemia with target LDL less than 100 -     Rosuvastatin Calcium; Take 1 tablet (5 mg total) by mouth daily.  Dispense: 90 tablet; Refill: 1 -     Lipid panel  Factor V Leiden (HCC) -     CBC with Differential/Platelet  Routine general medical examination at a health care facility  Other orders -     Albuterol Sulfate; Take 3 mLs (2.5 mg total) by nebulization every 6 (six) hours as needed for wheezing or shortness of breath.  Dispense: 75 mL; Refill: 12    Return in 6 months (on 06/01/2024).     Karie Georges, MD

## 2023-12-02 NOTE — Telephone Encounter (Signed)
Noted  

## 2024-01-10 ENCOUNTER — Other Ambulatory Visit: Payer: Self-pay | Admitting: Family Medicine

## 2024-01-10 DIAGNOSIS — G250 Essential tremor: Secondary | ICD-10-CM

## 2024-02-01 NOTE — Progress Notes (Signed)
 Cardiology Office Note:  .   Date:  02/03/2024  ID:  Matthew Trujillo, DOB [birth year], MRN [id number] PCP: Ozell Heron HERO, MD  [facility] Providers Cardiologist:David Anner, MD {    History of Present Illness: .   Matthew Trujillo is a 73 y.o. male with history of hypertension, recurrent acute DVTs of left lower extremity on Savaysa  because of interaction with Eliquis  or Xarelto and primidone , palpitations, hyperlipidemia, and Graves' disease.  Last seen by Dr. Anner on 02/02/2022 and was doing well without any cardiac complaints.  He is here for 2-year follow-up for continued establishment with cardiology service.  He comes today with concerns about his blood pressure rising somewhat.  He normally has been running in the 120s over 60s but is now climbing into the 130s over 70s.  He admits to being under a lot of family stress both his wife and his daughter-in-law are receiving cancer treatment.  His wife's cancer is fairly severe and she is currently on immunotherapy which is causing her a lot of pain.  He is tearful when speaking of it.  He denies chest pain palpitations dyspnea on exertion he does have some fatigue from the stress.  He is able to complete all of his ADLs and is medically compliant.  ROS: As above otherwise negative  Studies Reviewed: SABRA    EKG Interpretation Date/Time:  Friday February 03 2024 10:21:01 EST Ventricular Rate:  77 PR Interval:  152 QRS Duration:  98 QT Interval:  398 QTC Calculation: 450 R Axis:   46  Text Interpretation: Normal sinus rhythm Normal ECG When compared with ECG of 09-Aug-2019 12:28, No significant change was found Confirmed by Jerilynn Collar [phone]) on 02/03/2024 10:56:29 AM    Physical Exam:   VS:  BP 136/70 (BP Location: Left Arm, Patient Position: Sitting, Cuff Size: Normal)   Pulse 77   Ht 6' 2 (1.88 m)   Wt 233 lb (105.7 kg)   BMI 29.92 kg/m    Wt Readings from Last 3 Encounters:  02/03/24 233 lb (105.7 kg)   02/03/24 233 lb 2 oz (105.7 kg)  12/02/23 230 lb 9.6 oz (104.6 kg)    GEN: Well nourished, well developed in no acute distress NECK: No JVD; No carotid bruits CARDIAC: RRR, no murmurs, rubs, gallops RESPIRATORY:  Clear to auscultation without rales, wheezing or rhonchi  ABDOMEN: Soft, non-tender, non-distended EXTREMITIES:  No edema; No deformity   ASSESSMENT AND PLAN: .    Hypertension: He remains on amlodipine , chlorthalidone , and ramipril .  Blood pressure is risen between 5-7 points higher than normal.  I will not adjust medications at this time.  I do believe the distress related.  I have told him to keep monitoring his blood pressure and to call us  if his blood pressure is 150/90 or higher.  At which time we would need to adjust his medications likely ramipril  dose.  I will have him follow-up in 6 months for reevaluation of his status and blood pressure.  2.  Recurrent DVTs of the left lower extremity: Remains on Savaysa .  He has no complaints of melena, [employer] taxis, or other bleeding issues.  No changes at this time.  3.  Hyperlipidemia: The patient LDL goal is less than 100.  He is on rosuvastatin  5 mg daily.  Labs are followed by PCP.  If not completed by the time he sees us  again in cardiology of this may be necessary for documentation and management.  Signed, Lamarr HERO. Jerilynn CHOL, ANP, AACC

## 2024-02-03 ENCOUNTER — Ambulatory Visit (INDEPENDENT_AMBULATORY_CARE_PROVIDER_SITE_OTHER): Payer: Medicare HMO | Admitting: Gastroenterology

## 2024-02-03 ENCOUNTER — Encounter: Payer: Self-pay | Admitting: Adult Health

## 2024-02-03 ENCOUNTER — Encounter: Payer: Self-pay | Admitting: Gastroenterology

## 2024-02-03 ENCOUNTER — Ambulatory Visit: Payer: Medicare HMO | Admitting: General Practice

## 2024-02-03 ENCOUNTER — Ambulatory Visit: Payer: Medicare HMO | Attending: Adult Health | Admitting: Adult Health

## 2024-02-03 VITALS — BP 136/70 | HR 77 | Ht 74.0 in | Wt 233.0 lb

## 2024-02-03 VITALS — BP 138/72 | HR 77 | Ht 74.0 in | Wt 233.1 lb

## 2024-02-03 DIAGNOSIS — E7800 Pure hypercholesterolemia, unspecified: Secondary | ICD-10-CM

## 2024-02-03 DIAGNOSIS — I82402 Acute embolism and thrombosis of unspecified deep veins of left lower extremity: Secondary | ICD-10-CM

## 2024-02-03 DIAGNOSIS — Z86718 Personal history of other venous thrombosis and embolism: Secondary | ICD-10-CM | POA: Diagnosis not present

## 2024-02-03 DIAGNOSIS — D6851 Activated protein C resistance: Secondary | ICD-10-CM

## 2024-02-03 DIAGNOSIS — Z7901 Long term (current) use of anticoagulants: Secondary | ICD-10-CM | POA: Insufficient documentation

## 2024-02-03 DIAGNOSIS — R002 Palpitations: Secondary | ICD-10-CM

## 2024-02-03 DIAGNOSIS — Z860100 Personal history of colon polyps, unspecified: Secondary | ICD-10-CM | POA: Insufficient documentation

## 2024-02-03 DIAGNOSIS — Z860101 Personal history of adenomatous and serrated colon polyps: Secondary | ICD-10-CM

## 2024-02-03 DIAGNOSIS — I10 Essential (primary) hypertension: Secondary | ICD-10-CM | POA: Diagnosis not present

## 2024-02-03 DIAGNOSIS — K219 Gastro-esophageal reflux disease without esophagitis: Secondary | ICD-10-CM | POA: Diagnosis not present

## 2024-02-03 MED ORDER — SUFLAVE 178.7 G PO SOLR: 1.0000 | Freq: Once | ORAL | 0 refills | Status: AC

## 2024-02-03 NOTE — Progress Notes (Signed)
 02/03/2024 Matthew Trujillo [id number] 1951/10/21   HISTORY OF PRESENT ILLNESS: This is a pleasant 73 year old male who is a patient of Dr. Nancyann.  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  He is on blood thinner/anticoagulant in the form of Savaysa .  He also has Graves' disease/hyperthyroidism and essential tremors.  He is here today to schedule his surveillance colonoscopy.  Colonoscopy by Dr. Abran 10/2018 showed a 4 mm polyp that was removed and was a tubular adenoma, but was otherwise unremarkable.   Repeat recommended in 5 years.  He is doing well.  Moves his bowels 2 or 3 times a day depending on how much fiber he eats.  No rectal bleeding.  He does report some issues with reflux if he eats too late at night or if he eats spaghetti sauce, etc. at night.  He says his wife likes to eat late, but if she makes something with tomato sauce then he would just have something different and eat that the next day for lunch.  He has been doing well as long as he follows these modifications.  Only uses occasional Tums.  Past Medical History:  Diagnosis Date   ALLERGIC RHINITIS 05/21/2010   ASTHMA 05/21/2010   Asthma    Factor V Leiden (HCC)    Hx of adenomatous colonic polyps    HYPERTENSION 05/21/2010   Diffiult to control   Hyperthyroidism    Inguinal hernia    Ischemic colitis (HCC)    Lower GI bleed    Prolonged QT interval    PULMONARY EMBOLISM 05/21/2010   Umbilical hernia    Past Surgical History:  Procedure Laterality Date   ACHILLES TENDON REPAIR     COLONOSCOPY     last 2019, multiple   DOPPLER ECHOCARDIOGRAPHY  12/30/2011   EF >55%; MILD LVH,MILD LAE,NORMAL DIASTOLIC FUNCTION   RENAL DOPPLER  12/30/2011   NORMAL RENAL   UMBILICAL HERNIA REPAIR N/A 01/04/2020   Procedure: UMBILICAL HERNIA REPAIR WITH MESH;  Surgeon: Curvin Deward MOULD, MD;  Location: [facility];  Service: General;  Laterality: N/A;    reports that  he has never smoked. He has never used smokeless tobacco. He reports current alcohol use. He reports that he does not use drugs. family history includes Hypertension in his mother; Hypothyroidism in his mother and sister; Kidney failure in his father; Pancreatic cancer in his brother; Renal cancer in his father. Allergies  Allergen Reactions   Celebrex [Celecoxib]     Blood in stool   Other     Animal dander causes sneezing and watery eyes   Neosporin [Bacitracin-Polymyxin B] Rash      Outpatient Encounter Medications as of 02/03/2024  Medication Sig   albuterol  (PROVENTIL ) (2.5 MG/3ML) 0.083% nebulizer solution Take 3 mLs (2.5 mg total) by nebulization every 6 (six) hours as needed for wheezing or shortness of breath.   albuterol  (VENTOLIN  HFA) 108 (90 Base) MCG/ACT inhaler INHALE 2 PUFFS INTO THE LUNGS EVERY 4 HOURS AS NEEDED FOR WHEEZE   amLODipine  (NORVASC ) 10 MG tablet TAKE 1 TABLET BY MOUTH EVERY DAY   budesonide -formoterol  (SYMBICORT) 160-4.5 MCG/ACT inhaler Inhale 2 puffs into the lungs 2 (two) times daily.   Calcium  Carbonate-Vitamin D 600-400 MG-UNIT tablet Take 1 tablet by mouth daily.   chlorthalidone  (HYGROTON ) 50 MG tablet TAKE 1 TABLET BY MOUTH EVERY DAY   Cholecalciferol (VITAMIN D-3) 5000 units TABS Take 5,000 Units by  mouth daily.   edoxaban  (SAVAYSA ) 60 MG TABS tablet Take 60 mg by mouth daily.   guaiFENesin -codeine  100-10 MG/5ML syrup Take 10 mLs by mouth every 6 (six) hours as needed for cough.   methimazole  (TAPAZOLE ) 5 MG tablet Take 1 tablet (5 mg total) by mouth daily.   Multiple Vitamin (MULTIVITAMIN) tablet Take 1 tablet by mouth daily.   Omega-3 Fatty Acids (FISH OIL) 1000 MG CAPS Take by mouth 2 (two) times daily.   primidone  (MYSOLINE ) 50 MG tablet TAKE 1 TABLET (50 MG) BY MOUTH IN THE MORNING AND AT BEDTIME   ramipril  (ALTACE ) 10 MG capsule TAKE 1 CAPSULE BY MOUTH EVERY DAY   rosuvastatin  (CRESTOR ) 5 MG tablet Take 1 tablet (5 mg total) by mouth daily.    Spacer/Aero-Holding Chambers (E-Z SPACER) inhaler Use as instructed   No facility-administered encounter medications on file as of 02/03/2024.    REVIEW OF SYSTEMS  : All other systems reviewed and negative except where noted in the History of Present Illness.   PHYSICAL EXAM: Ht 6' 2 (1.88 m)   Wt 233 lb 2 oz (105.7 kg)   BMI 29.93 kg/m  General: Well developed white male in no acute distress Head: Normocephalic and atraumatic Eyes:  Sclerae anicteric, conjunctiva pink. Ears: Normal auditory acuity Lungs: Clear throughout to auscultation; no W/R/R. Heart: Regular rate and rhythm; no M/R/G. Rectal: Will be done at the time of colonoscopy. Musculoskeletal: Symmetrical with no gross deformities  Skin: No lesions on visible extremities Neurological: Alert oriented x 4, grossly non-focal Psychological:  Alert and cooperative. Normal mood and affect  ASSESSMENT AND PLAN: *Personal history of colon polyps: Had 1 tubular adenoma removed in November 2019.  Repeat recommended in 5 years.  Will schedule Dr. Abran. *GERD: Just occasional reflux at night if he eats too late or eats something like spaghetti sauce for dinner later in the evening.  He has made changes/modifications and is doing well.  Just uses very rare Tums. *Leiden Factor 5 with history of DVT/PE, on Savaysa :  Will hold Savaysa  for 2 days prior to endoscopic procedures - will instruct when and how to resume after procedure. Benefits and risks of procedure explained including risks of bleeding, perforation, infection, missed lesions, reactions to medications and possible need for hospitalization and surgery for complications. Additional rare but real risk of stroke or other vascular clotting events off Savaysa  also explained and need to seek urgent help if any signs of these problems occur. Will communicate by phone or EMR with patient's prescribing provider to confirm that holding Savaysa  is reasonable in this case.     CC:   Matthew Heron HERO, MD

## 2024-02-03 NOTE — Progress Notes (Signed)
 Noted.

## 2024-02-03 NOTE — Patient Instructions (Signed)
 Medication Instructions:  NO CHANGES *If you need a refill on your cardiac medications before your next appointment, please call your pharmacy*   Lab Work: NO LABS If you have labs (blood work) drawn today and your tests are completely normal, you will receive your results only by: MyChart Message (if you have MyChart) OR A paper copy in the mail If you have any lab test that is abnormal or we need to change your treatment, we will call you to review the results.   Testing/Procedures: NO TESTING   Follow-Up: At [facility], you and your health needs are our priority.  As part of our continuing mission to provide you with exceptional heart care, we have created designated Provider Care Teams.  These Care Teams include your primary Cardiologist (physician) and Advanced Practice Providers (APPs -  Physician Assistants and Nurse Practitioners) who all work together to provide you with the care you need, when you need it.  Your next appointment:   6 month(s)  Provider:   Alm Clay, MD   Other Instructions

## 2024-02-03 NOTE — Patient Instructions (Signed)
 You have been scheduled for a colonoscopy. Please follow written instructions given to you at your visit today.   If you use inhalers (even only as needed), please bring them with you on the day of your procedure.  DO NOT TAKE 7 DAYS PRIOR TO TEST- Trulicity (dulaglutide) Ozempic, Wegovy (semaglutide) Mounjaro (tirzepatide) Bydureon Bcise (exanatide extended release)  DO NOT TAKE 1 DAY PRIOR TO YOUR TEST Rybelsus (semaglutide) Adlyxin (lixisenatide) Victoza (liraglutide) Byetta (exanatide) ___________________________________________________________________________  Matthew Trujillo will receive your bowel preparation through [facility], which ensures the lowest copay and home delivery, with outreach via text or call from an [phone] number. Please respond promptly to avoid rescheduling of your procedure. If you are interested in alternative options or have any questions regarding your prep, please contact them at [phone] ____________________________________________________________________________  Your Provider Has Sent Your Bowel Prep Regimen To [facility]   [facility] will contact you to verify your information and collect your copay, if applicable. Enjoy the comfort of your home while your prescription is mailed to you, FREE of any shipping charges.   [facility] accepts all major insurance benefits and applies discounts & coupons.  Have additional questions?   Chat: [email or web address] Call: [phone] Email: [email or web address] NCPDP: [id number]  How will [facility] contact you?  With a Welcome phone call,  a Welcome text and a checkout link in text form.  Texts you receive from [phone] Are NOT Spam.  *To set up delivery, you must complete the checkout process via link or speak to one of the patient care representatives. If [facility] is unable to reach you, your prescription may be delayed.  To avoid long hold times on the phone, you may also utilize the secure chat  feature on the [facility] website to request that they call you back for transaction completion or to expedite your concerns.  _______________________________________________________  If your blood pressure at your visit was 140/90 or greater, please contact your primary care physician to follow up on this.  _______________________________________________________  If you are age 46 or older, your body mass index should be between 23-30. Your Body mass index is 29.93 kg/m. If this is out of the aforementioned range listed, please consider follow up with your Primary Care Provider.  If you are age 88 or younger, your body mass index should be between 19-25. Your Body mass index is 29.93 kg/m. If this is out of the aformentioned range listed, please consider follow up with your Primary Care Provider.   ________________________________________________________  The [facility] providers would like to encourage you to use MYCHART to communicate with providers for non-urgent requests or questions.  Due to long hold times on the telephone, sending your provider a message by [facility] may be a faster and more efficient way to get a response.  Please allow 48 business hours for a response.  Please remember that this is for non-urgent requests.  _______________________________________________________

## 2024-02-06 ENCOUNTER — Telehealth: Payer: Self-pay | Admitting: *Deleted

## 2024-02-06 NOTE — Telephone Encounter (Signed)
  Matthew Trujillo 12/03/1951 [id number]  02/06/2024   Dear Dr. Bambi Lever:  We have scheduled the above named patient for a(n) colonoscopy procedure. Our records show that (s)he is on anticoagulation therapy.  Please advise as to whether the patient may come off their therapy of Savaysa  2 days prior to their procedure which is scheduled for 03/05/24.  Please route your response to Monita Anon, CMA or fax response to [phone].  Sincerely,   Monita Anon, [facility]

## 2024-02-06 NOTE — Telephone Encounter (Signed)
 Yes they may stop the savaysa  2 days prior, then immediately resume after the procedure. Thanks!

## 2024-02-08 ENCOUNTER — Encounter: Payer: Self-pay | Admitting: Family Medicine

## 2024-02-09 ENCOUNTER — Telehealth: Payer: Medicare HMO | Admitting: Physician Assistant

## 2024-02-09 DIAGNOSIS — Z20828 Contact with and (suspected) exposure to other viral communicable diseases: Secondary | ICD-10-CM

## 2024-02-09 MED ORDER — OSELTAMIVIR PHOSPHATE 75 MG PO CAPS: 75.0000 mg | ORAL_CAPSULE | Freq: Every day | ORAL | 0 refills | Status: AC

## 2024-02-09 NOTE — Progress Notes (Signed)
Virtual Visit Consent   Matthew Trujillo, you are scheduled for a virtual visit with a [facility] provider today. Just as with appointments in the office, your consent must be obtained to participate. Your consent will be active for this visit and any virtual visit you may have with one of our providers in the next 365 days. If you have a MyChart account, a copy of this consent can be sent to you electronically.  As this is a virtual visit, video technology does not allow for your provider to perform a traditional examination. This may limit your provider's ability to fully assess your condition. If your provider identifies any concerns that need to be evaluated in person or the need to arrange testing (such as labs, EKG, etc.), we will make arrangements to do so. Although advances in technology are sophisticated, we cannot ensure that it will always work on either your end or our end. If the connection with a video visit is poor, the visit may have to be switched to a telephone visit. With either a video or telephone visit, we are not always able to ensure that we have a secure connection.  By engaging in this virtual visit, you consent to the provision of healthcare and authorize for your insurance to be billed (if applicable) for the services provided during this visit. Depending on your insurance coverage, you may receive a charge related to this service.  I need to obtain your verbal consent now. Are you willing to proceed with your visit today? Matthew Trujillo has provided verbal consent on 02/09/2024 for a virtual visit (video or telephone). Piedad Climes, [place]  Date: 02/09/2024 12:32 PM   Virtual Visit via Video Note   I, Piedad Climes, connected with  Matthew Trujillo  ([id number], 10-Jun-1951) on 02/09/24 at 12:00 PM EST by a video-enabled telemedicine application and verified that I am speaking with the correct person using two identifiers.  Location: Patient: Virtual Visit  Location Patient: Home Provider: Virtual Visit Location Provider: Home Office   I discussed the limitations of evaluation and management by telemedicine and the availability of in person appointments. The patient expressed understanding and agreed to proceed.    History of Present Illness: Matthew Trujillo is a 73 y.o. who identifies as a male who was assigned male at birth, and is being seen today for concern of flu exposure. Notes he is currently asymptomatic but has been around his family who have all tested positive for Flu in the past few days. Notes son was diagnosed on Monday, followed Tuesday by daughter-in-law, and then his grandchildren. He and wife are having to help take care of grandchildren because of how sick his son and DIL are. Is requesting preventive Tamiflu.    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) 10/26/2017   Palpitations 09/01/2017   Hyperthyroidism 08/30/2017   Bilateral edema of lower extremity 08/30/2017   Essential hypertension 05/21/2010  History of pulmonary embolism 05/21/2010   Allergic rhinitis 05/21/2010    Allergies:  Allergies  Allergen Reactions   Celebrex [Celecoxib]     Blood in stool   Other     Animal dander causes sneezing and watery eyes   Neosporin [Bacitracin-Polymyxin B] Rash   Medications:  Current Outpatient Medications:    oseltamivir (TAMIFLU) 75 MG capsule, Take 1 capsule (75 mg total) by mouth daily for 10  doses., Disp: 10 capsule, Rfl: 0   albuterol (PROVENTIL) (2.5 MG/3ML) 0.083% nebulizer solution, Take 3 mLs (2.5 mg total) by nebulization every 6 (six) hours as needed for wheezing or shortness of breath., Disp: 75 mL, Rfl: 12   albuterol (VENTOLIN HFA) 108 (90 Base) MCG/ACT inhaler, INHALE 2 PUFFS INTO THE LUNGS EVERY 4 HOURS AS NEEDED FOR WHEEZE, Disp: 18 each, Rfl: 2   amLODipine (NORVASC) 10 MG tablet, TAKE 1 TABLET BY MOUTH EVERY DAY, Disp: 90 tablet, Rfl: 3   budesonide-formoterol (SYMBICORT) 160-4.5 MCG/ACT inhaler, Inhale 2 puffs into the lungs 2 (two) times daily., Disp: 1 each, Rfl: 3   Calcium Carbonate-Vitamin D 600-400 MG-UNIT tablet, Take 1 tablet by mouth daily., Disp: , Rfl:    chlorthalidone (HYGROTON) 50 MG tablet, TAKE 1 TABLET BY MOUTH EVERY DAY, Disp: 90 tablet, Rfl: 1   Cholecalciferol (VITAMIN D-3) 5000 units TABS, Take 5,000 Units by mouth daily., Disp: , Rfl:    edoxaban (SAVAYSA) 60 MG TABS tablet, Take 60 mg by mouth daily., Disp: 90 tablet, Rfl: 3   guaiFENesin-codeine 100-10 MG/5ML syrup, Take 10 mLs by mouth every 6 (six) hours as needed for cough., Disp: 237 mL, Rfl: 0   methimazole (TAPAZOLE) 5 MG tablet, Take 1 tablet (5 mg total) by mouth daily., Disp: 90 tablet, Rfl: 3   Multiple Vitamin (MULTIVITAMIN) tablet, Take 1 tablet by mouth daily., Disp: , Rfl:    Omega-3 Fatty Acids (FISH OIL) 1000 MG CAPS, Take by mouth 2 (two) times daily., Disp: , Rfl:    primidone (MYSOLINE) 50 MG tablet, TAKE 1 TABLET (50 MG) BY MOUTH IN THE MORNING AND AT BEDTIME, Disp: 180 tablet, Rfl: 1   ramipril (ALTACE) 10 MG capsule, TAKE 1 CAPSULE BY MOUTH EVERY DAY, Disp: 90 capsule, Rfl: 3   rosuvastatin (CRESTOR) 5 MG tablet, Take 1 tablet (5 mg total) by mouth daily., Disp: 90 tablet, Rfl: 1   Spacer/Aero-Holding Chambers (E-Z SPACER) inhaler, Use as instructed, Disp: 1 each, Rfl: 2  Observations/Objective: Patient is well-developed, well-nourished in no acute distress.  Resting  comfortably  at home.  Head is normocephalic, atraumatic.  No labored breathing.  Speech is clear and coherent with logical content.  Patient is alert and oriented at baseline.   Assessment and Plan: 1. Exposure to the flu (Primary)  From multiple contacts. Asymptomatic but will have to be around them again. Risk for complications from the flu. Will provide preventive Tamiflu once daily for 10 days. Supportive measures and precautions reviewed with patient.   Follow Up Instructions: I discussed the assessment and treatment plan with the patient. The patient was provided an opportunity to ask questions and all were answered. The patient agreed with the plan and demonstrated an understanding of the instructions.  A copy of instructions were sent to the patient via MyChart unless otherwise noted below.   The patient was advised to call back or seek an in-person evaluation if the symptoms worsen or if the condition fails to improve as anticipated.    Sherley Bounds  Daphine Deutscher, PA-C

## 2024-02-09 NOTE — Patient Instructions (Signed)
Rodney Booze, thank you for joining Piedad Climes, PA-C for today's virtual visit.  While this provider is not your primary care provider (PCP), if your PCP is located in our provider database this encounter information will be shared with them immediately following your visit.   A [facility] MyChart account gives you access to today's visit and all your visits, tests, and labs performed at [facility] " click here if you don't have a [facility] MyChart account or go to [email or web address]  Consent: (Patient) Rodney Booze provided verbal consent for this virtual visit at the beginning of the encounter.  Current Medications:  Current Outpatient Medications:    albuterol (PROVENTIL) (2.5 MG/3ML) 0.083% nebulizer solution, Take 3 mLs (2.5 mg total) by nebulization every 6 (six) hours as needed for wheezing or shortness of breath., Disp: 75 mL, Rfl: 12   albuterol (VENTOLIN HFA) 108 (90 Base) MCG/ACT inhaler, INHALE 2 PUFFS INTO THE LUNGS EVERY 4 HOURS AS NEEDED FOR WHEEZE, Disp: 18 each, Rfl: 2   amLODipine (NORVASC) 10 MG tablet, TAKE 1 TABLET BY MOUTH EVERY DAY, Disp: 90 tablet, Rfl: 3   budesonide-formoterol (SYMBICORT) 160-4.5 MCG/ACT inhaler, Inhale 2 puffs into the lungs 2 (two) times daily., Disp: 1 each, Rfl: 3   Calcium Carbonate-Vitamin D 600-400 MG-UNIT tablet, Take 1 tablet by mouth daily., Disp: , Rfl:    chlorthalidone (HYGROTON) 50 MG tablet, TAKE 1 TABLET BY MOUTH EVERY DAY, Disp: 90 tablet, Rfl: 1   Cholecalciferol (VITAMIN D-3) 5000 units TABS, Take 5,000 Units by mouth daily., Disp: , Rfl:    edoxaban (SAVAYSA) 60 MG TABS tablet, Take 60 mg by mouth daily., Disp: 90 tablet, Rfl: 3   guaiFENesin-codeine 100-10 MG/5ML syrup, Take 10 mLs by mouth every 6 (six) hours as needed for cough., Disp: 237 mL, Rfl: 0   methimazole (TAPAZOLE) 5 MG tablet, Take 1 tablet (5 mg total) by mouth daily., Disp: 90 tablet, Rfl: 3   Multiple Vitamin (MULTIVITAMIN)  tablet, Take 1 tablet by mouth daily., Disp: , Rfl:    Omega-3 Fatty Acids (FISH OIL) 1000 MG CAPS, Take by mouth 2 (two) times daily., Disp: , Rfl:    primidone (MYSOLINE) 50 MG tablet, TAKE 1 TABLET (50 MG) BY MOUTH IN THE MORNING AND AT BEDTIME, Disp: 180 tablet, Rfl: 1   ramipril (ALTACE) 10 MG capsule, TAKE 1 CAPSULE BY MOUTH EVERY DAY, Disp: 90 capsule, Rfl: 3   rosuvastatin (CRESTOR) 5 MG tablet, Take 1 tablet (5 mg total) by mouth daily., Disp: 90 tablet, Rfl: 1   Spacer/Aero-Holding Chambers (E-Z SPACER) inhaler, Use as instructed, Disp: 1 each, Rfl: 2   Medications ordered in this encounter:  No orders of the defined types were placed in this encounter.    *If you need refills on other medications prior to your next appointment, please contact your pharmacy*  Follow-Up: Call back or seek an in-person evaluation if the symptoms worsen or if the condition fails to improve as anticipated.  [facility] [phone]  Other Instructions Preventing Influenza, Adult Influenza, also known as the flu, is an infection caused by a germ called a virus. It affects your respiratory system, which includes your nose, throat, windpipe, and lungs. The flu causes cold symptoms, as well as a high fever and body aches. The flu is contagious, which means it spreads easily from person to person. You're more likely to get it during flu season, which goes from December to March. You can  get the flu by: Breathing in droplets when an infected person coughs or sneezes. Touching something that has the germs on it and then touching your mouth, nose, or eyes. How can the flu affect me? Having the flu can lead to other infections. These may include: A lung infection, like pneumonia. An ear infection. A sinus infection. If you get the flu, you may infect the people around you. The flu can be very serious for: Babies. People 49 years old and older. People with long-term diseases. What can  increase my risk? You may be more likely to get the flu if: You don't wash your hands often. You have close contact with a lot of people during flu season. You touch your mouth, eyes, or nose without first washing your hands. You don't get the vaccine for the flu each year. This vaccine is also called the flu shot. You work in health care. You may be at risk for more serious flu symptoms if: You're 65 years and older. You're pregnant. Your body's defense, or immune, system is weak. You have a condition that makes the flu worse. You're very overweight. What actions can I take to prevent the flu? Lifestyle Keep your body's immune system strong. To do this: Eat a healthy diet. Drink enough fluid to keep your pee (urine) pale yellow. Get enough sleep. Get enough exercise. Do not use any products that contain nicotine or tobacco. These products include cigarettes, chewing tobacco, and vaping devices, such as e-cigarettes. If you need help quitting, ask your health care provider. Medicines Get a flu shot each year. It's the best way to prevent the flu. Try to get the shot as soon as you can in the fall. But getting a flu shot in the winter or spring instead is still a good idea. Flu season can last into early spring. You need to get a new flu shot each year. This is because the germs that cause the flu can change slightly from year to year. If you get the flu after you get the shot, the shot can make your illness shorter and milder. It can also stop you from getting other serious infections caused by the flu. If you're pregnant, you can and should get a flu shot. Check with your provider before getting a flu shot if you've had a reaction to the shot in the past. You may be able to get the vaccine as a nasal spray instead. But the nasal spray may not work as well as the shot. Check with your provider if you have questions about this. Most people who have the flu get better by resting at home  and drinking lots of fluids. But a medicine called an antiviral may lessen your symptoms and make the flu go away faster. People with more serious flu symptoms may need this medicine. Ask your provider if you need it. The medicine must be started within a few days of getting symptoms.  General information     Practice good health habits. Good habits are key during flu season. Avoid contact with people who are sick with flu or cold symptoms. Wash your hands with soap and water often and for at least 20 seconds. If soap and water aren't available, use hand sanitizer. Try not to touch your face, especially if you haven't washed your hands. Clean surfaces at home and at work that may have germs on them. Use a cleanser called a disinfectant that kills germs. If you get the flu: Stay  home until your symptoms, such as your fever, have been gone for at least 24 hours. Cover your mouth and nose when you cough or sneeze. Avoid close contact with others.  Where to find more information Centers for Disease Control and Prevention (CDC): TonerPromos.no American Academy of Family Physicians (AAFP): familydoctor.org Contact a health care provider if: You have the flu, and you get new symptoms. You have watery poop, or diarrhea. You have a fever. Your cough gets worse. You make more mucus. Get help right away if: You have trouble breathing. You have chest pain. These symptoms may be an emergency. Get help right away. Call 911. Do not wait to see if the symptoms will go away. Do not drive yourself to the hospital. This information is not intended to replace advice given to you by your health care provider. Make sure you discuss any questions you have with your health care provider. Document Revised: 03/10/2023 Document Reviewed: 03/10/2023 Elsevier Patient Education  2024 Elsevier Inc.   If you have been instructed to have an in-person evaluation today at a local Urgent Care facility, please use the  link below. It will take you to a list of all of our available [facility], including address, phone number and hours of operation. Please do not delay care.  [facility]  If you or a family member do not have a primary care provider, use the link below to schedule a visit and establish care. When you choose a [facility] primary care physician or advanced practice provider, you gain a long-term partner in health. Find a Primary Care Provider  Learn more about [facility]'s in-office and virtual care options: [facility] - Get Care Now

## 2024-02-10 NOTE — Telephone Encounter (Signed)
Patient informed.

## 2024-02-27 ENCOUNTER — Encounter: Payer: Self-pay | Admitting: Internal Medicine

## 2024-03-05 ENCOUNTER — Ambulatory Visit (AMBULATORY_SURGERY_CENTER): Payer: Medicare HMO | Admitting: Internal Medicine

## 2024-03-05 ENCOUNTER — Telehealth: Payer: Self-pay | Admitting: Internal Medicine

## 2024-03-05 ENCOUNTER — Encounter: Payer: Self-pay | Admitting: Internal Medicine

## 2024-03-05 VITALS — BP 147/79 | HR 74 | Temp 98.2°F | Resp 14 | Ht 74.0 in | Wt 233.0 lb

## 2024-03-05 DIAGNOSIS — Z860101 Personal history of adenomatous and serrated colon polyps: Secondary | ICD-10-CM

## 2024-03-05 DIAGNOSIS — Z860100 Personal history of colon polyps, unspecified: Secondary | ICD-10-CM

## 2024-03-05 DIAGNOSIS — Z1211 Encounter for screening for malignant neoplasm of colon: Secondary | ICD-10-CM | POA: Diagnosis not present

## 2024-03-05 MED ORDER — SODIUM CHLORIDE 0.9 % IV SOLN: 500.0000 mL | Freq: Once | INTRAVENOUS | Status: DC

## 2024-03-05 NOTE — Telephone Encounter (Signed)
 Returned pts call.  Advised him that he is fine to have started prep earlier.  He was confused about arrival time after getting a message last week saying to arrive at 1:00.  Confirmed that he can plan to arrive at 2:00.

## 2024-03-05 NOTE — Telephone Encounter (Signed)
 Inbound call from patient stating he took his prep medication an hour earlier that instructed this morning for today's procedure at 2:00 pm. Patient is requesting to know if this was okay. Please advise, thank you.

## 2024-03-05 NOTE — Patient Instructions (Addendum)
-  Resume taking Savaysa today -Continue other present medications  YOU HAD AN ENDOSCOPIC PROCEDURE TODAY AT THE [facility]:   Refer to the procedure report that was given to you for any specific questions about what was found during the examination.  If the procedure report does not answer your questions, please call your gastroenterologist to clarify.  If you requested that your care partner not be given the details of your procedure findings, then the procedure report has been included in a sealed envelope for you to review at your convenience later.  YOU SHOULD EXPECT: Some feelings of bloating in the abdomen. Passage of more gas than usual.  Walking can help get rid of the air that was put into your GI tract during the procedure and reduce the bloating. If you had a lower endoscopy (such as a colonoscopy or flexible sigmoidoscopy) you may notice spotting of blood in your stool or on the toilet paper. If you underwent a bowel prep for your procedure, you may not have a normal bowel movement for a few days.  Please Note:  You might notice some irritation and congestion in your nose or some drainage.  This is from the oxygen used during your procedure.  There is no need for concern and it should clear up in a day or so.  SYMPTOMS TO REPORT IMMEDIATELY:  Following lower endoscopy (colonoscopy or flexible sigmoidoscopy):  Excessive amounts of blood in the stool  Significant tenderness or worsening of abdominal pains  Swelling of the abdomen that is new, acute  Fever of 100F or higher  For urgent or emergent issues, a gastroenterologist can be reached at any hour by calling [phone]. Do not use MyChart messaging for urgent concerns.    DIET:  We do recommend a small meal at first, but then you may proceed to your regular diet.  Drink plenty of fluids but you should avoid alcoholic beverages for 24 hours.  ACTIVITY:  You should plan to take it easy for the rest of today and  you should NOT DRIVE or use heavy machinery until tomorrow (because of the sedation medicines used during the test).    FOLLOW UP: Our staff will call the number listed on your records the next business day following your procedure.  We will call around 7:15- 8:00 am to check on you and address any questions or concerns that you may have regarding the information given to you following your procedure. If we do not reach you, we will leave a message.     If any biopsies were taken you will be contacted by phone or by letter within the next 1-3 weeks.  Please call us at [phone] if you have not heard about the biopsies in 3 weeks.    SIGNATURES/CONFIDENTIALITY: You and/or your care partner have signed paperwork which will be entered into your electronic medical record.  These signatures attest to the fact that that the information above on your After Visit Summary has been reviewed and is understood.  Full responsibility of the confidentiality of this discharge information lies with you and/or your care-partner.

## 2024-03-05 NOTE — Op Note (Signed)
 [name] Procedure Date: 03/05/2024 2:44 PM MRN: [id number] Endoscopist: Wilhemina Bonito. Marina Goodell , MD, [id number] Age: 73 Referring MD:  Date of Birth: 06-05-1951 Gender: Male Account #: [id number] Procedure:                Colonoscopy Indications:              High risk colon cancer surveillance: Personal                            history of non-advanced adenomas. Previous                            examinations 2012, 2019 Medicines:                Monitored Anesthesia Care Procedure:                Pre-Anesthesia Assessment:                           - Prior to the procedure, a History and Physical                            was performed, and patient medications and                            allergies were reviewed. The patient's tolerance of                            previous anesthesia was also reviewed. The risks                            and benefits of the procedure and the sedation                            options and risks were discussed with the patient.                            All questions were answered, and informed consent                            was obtained. Prior Anticoagulants: The patient has                            taken Savaysa, last dose was 3 days prior to                            procedure. ASA Grade Assessment: III - A patient                            with severe systemic disease. After reviewing the                            risks and benefits, the patient was deemed in  satisfactory condition to undergo the procedure.                           After obtaining informed consent, the colonoscope                            was passed under direct vision. Throughout the                            procedure, the patient's blood pressure, pulse, and                            oxygen saturations were monitored continuously. The                            Olympus Scope SN: [id number] was  introduced through                            the anus and advanced to the the cecum, identified                            by appendiceal orifice and ileocecal valve. The                            ileocecal valve, appendiceal orifice, and rectum                            were photographed. The quality of the bowel                            preparation was excellent. The colonoscopy was                            performed without difficulty. The patient tolerated                            the procedure well. The bowel preparation used was                            SUPREP via split dose instruction. Scope In: 3:08:06 PM Scope Out: 3:21:18 PM Scope Withdrawal Time: 0 hours 9 minutes 26 seconds  Total Procedure Duration: 0 hours 13 minutes 12 seconds  Findings:                 The entire examined colon appeared normal on direct                            and retroflexion views. Complications:            No immediate complications. Estimated blood loss:                            None. Estimated Blood Loss:     Estimated blood loss: none. Impression:               -  The entire examined colon is normal on direct and                            retroflexion views.                           - No specimens collected. Recommendation:           - Repeat colonoscopy is not recommended for                            surveillance.                           - Resume Savaysa today at prior dose.                           - Patient has a contact number available for                            emergencies. The signs and symptoms of potential                            delayed complications were discussed with the                            patient. Return to normal activities tomorrow.                            Written discharge instructions were provided to the                            patient.                           - Resume previous diet.                           - Continue present  medications. Wilhemina Bonito. Marina Goodell, MD 03/05/2024 3:26:27 PM This report has been signed electronically.

## 2024-03-05 NOTE — Progress Notes (Signed)
 Expand All Collapse All        02/03/2024 Matthew Trujillo [id number] 28-Apr-1951     HISTORY OF PRESENT ILLNESS: This is a pleasant 73 year old male who is a patient of Dr. Lamar Sprinkles.  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  He is on blood thinner/anticoagulant in the form of Savaysa.  He also has Graves' disease/hyperthyroidism and essential tremors.  He is here today to schedule his surveillance colonoscopy.   Colonoscopy by Dr. Marina Goodell 10/2018 showed a 4 mm polyp that was removed and was a tubular adenoma, but was otherwise unremarkable.   Repeat recommended in 5 years.   He is doing well.  Moves his bowels 2 or 3 times a day depending on how much fiber he eats.  No rectal bleeding.   He does report some issues with reflux if he eats too late at night or if he eats spaghetti sauce, etc. at night.  He says his wife likes to eat late, but if she makes something with tomato sauce then he would just have something different and eat that the next day for lunch.  He has been doing well as long as he follows these modifications.  Only uses occasional Tums.       Past Medical History:  Diagnosis Date   ALLERGIC RHINITIS 05/21/2010   ASTHMA 05/21/2010   Asthma     Factor V Leiden (HCC)     Hx of adenomatous colonic polyps     HYPERTENSION 05/21/2010    Diffiult to control   Hyperthyroidism     Inguinal hernia     Ischemic colitis (HCC)     Lower GI bleed     Prolonged QT interval     PULMONARY EMBOLISM 05/21/2010   Umbilical hernia               Past Surgical History:  Procedure Laterality Date   ACHILLES TENDON REPAIR       COLONOSCOPY        last 2019, multiple   DOPPLER ECHOCARDIOGRAPHY   12/30/2011    EF >55%; MILD LVH,MILD LAE,NORMAL DIASTOLIC FUNCTION   RENAL DOPPLER   12/30/2011    NORMAL RENAL   UMBILICAL HERNIA REPAIR N/A 01/04/2020    Procedure: UMBILICAL HERNIA REPAIR WITH MESH;  Surgeon: Griselda Miner, MD;  Location: [facility]  [place];  Service: General;  Laterality: N/A;         reports that he has never smoked. He has never used smokeless tobacco. He reports current alcohol use. He reports that he does not use drugs. family history includes Hypertension in his mother; Hypothyroidism in his mother and sister; Kidney failure in his father; Pancreatic cancer in his brother; Renal cancer in his father. Allergies       Allergies  Allergen Reactions   Celebrex [Celecoxib]        Blood in stool   Other        Animal dander causes sneezing and watery eyes   Neosporin [Bacitracin-Polymyxin B] Rash              Outpatient Encounter Medications as of 02/03/2024  Medication Sig   albuterol (PROVENTIL) (2.5 MG/3ML) 0.083% nebulizer solution Take 3 mLs (2.5 mg total) by nebulization every 6 (six) hours as needed for wheezing or shortness of breath.   albuterol (VENTOLIN HFA) 108 (90 Base) MCG/ACT inhaler INHALE 2 PUFFS INTO THE LUNGS EVERY 4 HOURS AS  NEEDED FOR WHEEZE   amLODipine (NORVASC) 10 MG tablet TAKE 1 TABLET BY MOUTH EVERY DAY   budesonide-formoterol (SYMBICORT) 160-4.5 MCG/ACT inhaler Inhale 2 puffs into the lungs 2 (two) times daily.   Calcium Carbonate-Vitamin D 600-400 MG-UNIT tablet Take 1 tablet by mouth daily.   chlorthalidone (HYGROTON) 50 MG tablet TAKE 1 TABLET BY MOUTH EVERY DAY   Cholecalciferol (VITAMIN D-3) 5000 units TABS Take 5,000 Units by mouth daily.   edoxaban (SAVAYSA) 60 MG TABS tablet Take 60 mg by mouth daily.   guaiFENesin-codeine 100-10 MG/5ML syrup Take 10 mLs by mouth every 6 (six) hours as needed for cough.   methimazole (TAPAZOLE) 5 MG tablet Take 1 tablet (5 mg total) by mouth daily.   Multiple Vitamin (MULTIVITAMIN) tablet Take 1 tablet by mouth daily.   Omega-3 Fatty Acids (FISH OIL) 1000 MG CAPS Take by mouth 2 (two) times daily.   primidone (MYSOLINE) 50 MG tablet TAKE 1 TABLET (50 MG) BY MOUTH IN THE MORNING AND AT BEDTIME   ramipril (ALTACE) 10 MG capsule TAKE  1 CAPSULE BY MOUTH EVERY DAY   rosuvastatin (CRESTOR) 5 MG tablet Take 1 tablet (5 mg total) by mouth daily.   Spacer/Aero-Holding Chambers (E-Z SPACER) inhaler Use as instructed      No facility-administered encounter medications on file as of 02/03/2024.        REVIEW OF SYSTEMS  : All other systems reviewed and negative except where noted in the History of Present Illness.     PHYSICAL EXAM: Ht 6\' 2"  (1.88 m)   Wt 233 lb 2 oz (105.7 kg)   BMI 29.93 kg/m  General: Well developed white male in no acute distress Head: Normocephalic and atraumatic Eyes:  Sclerae anicteric, conjunctiva pink. Ears: Normal auditory acuity Lungs: Clear throughout to auscultation; no W/R/R. Heart: Regular rate and rhythm; no M/R/G. Rectal: Will be done at the time of colonoscopy. Musculoskeletal: Symmetrical with no gross deformities  Skin: No lesions on visible extremities Neurological: Alert oriented x 4, grossly non-focal Psychological:  Alert and cooperative. Normal mood and affect   ASSESSMENT AND PLAN: *Personal history of colon polyps: Had 1 tubular adenoma removed in November 2019.  Repeat recommended in 5 years.  Will schedule Dr. Marina Goodell. *GERD: Just occasional reflux at night if he eats too late or eats something like spaghetti sauce for dinner later in the evening.  He has made changes/modifications and is doing well.  Just uses very rare Tums. *Leiden Factor 5 with history of DVT/PE, on Savaysa:  Will hold Savaysa for 2 days prior to endoscopic procedures - will instruct when and how to resume after procedure. Benefits and risks of procedure explained including risks of bleeding, perforation, infection, missed lesions, reactions to medications and possible need for hospitalization and surgery for complications. Additional rare but real risk of stroke or other vascular clotting events off Savaysa also explained and need to seek urgent help if any signs of these problems occur. Will communicate by  phone or EMR with patient's prescribing provider to confirm that holding Savaysa is reasonable in this case.       CC:  Karie Georges, MD

## 2024-03-05 NOTE — Progress Notes (Signed)
 Pt resting comfortably. VSS. Airway intact. SBAR complete to RN. All questions answered.

## 2024-03-06 ENCOUNTER — Telehealth: Payer: Self-pay

## 2024-03-06 NOTE — Telephone Encounter (Signed)
  Follow up Call-     03/05/2024    1:59 PM  Call back number  Post procedure Call Back phone  # [phone]  Permission to leave phone message Yes     Patient questions:  Do you have a fever, pain , or abdominal swelling? No. Pain Score  0 *  Have you tolerated food without any problems? Yes.    Have you been able to return to your normal activities? Yes.    Do you have any questions about your discharge instructions: Diet   No. Medications  No. Follow up visit  No.  Do you have questions or concerns about your Care? No.  Actions: * If pain score is 4 or above: No action needed, pain <4.

## 2024-03-24 ENCOUNTER — Other Ambulatory Visit: Payer: Self-pay | Admitting: Family Medicine

## 2024-03-24 DIAGNOSIS — D6851 Activated protein C resistance: Secondary | ICD-10-CM

## 2024-03-26 ENCOUNTER — Other Ambulatory Visit: Payer: Self-pay | Admitting: Family Medicine

## 2024-03-26 ENCOUNTER — Encounter: Payer: Self-pay | Admitting: Family Medicine

## 2024-03-26 DIAGNOSIS — D6851 Activated protein C resistance: Secondary | ICD-10-CM

## 2024-03-26 DIAGNOSIS — G250 Essential tremor: Secondary | ICD-10-CM

## 2024-03-27 NOTE — Telephone Encounter (Signed)
 Please place referral to pharmacy services -- Marylene Land should be able to help him on this

## 2024-03-28 ENCOUNTER — Telehealth: Payer: Self-pay

## 2024-03-28 NOTE — Telephone Encounter (Signed)
 Copied from CRM [id number]. Topic: General - Other >> Mar 28, 2024  4:06 PM Matthew Trujillo wrote: Reason for CRM: patient called stating he was speaking to angela in pharmacy services and the call dropped

## 2024-03-29 ENCOUNTER — Telehealth: Payer: Self-pay

## 2024-03-29 ENCOUNTER — Other Ambulatory Visit (HOSPITAL_COMMUNITY): Payer: Self-pay

## 2024-03-29 NOTE — Progress Notes (Signed)
   03/29/2024  Patient ID: Matthew Trujillo, male   DOB: [birth year], 73 y.o.   MRN: [id number]  Received notice from PCP referral that patient's [name] is needing a prior authorization.  Completed prior auth via CoverMyMeds (Key: [id number]). Medication was approved through 12/26/2024.   Copay for 90DS is $458.90 at this time. Insurance unable to inform me if a portion of this was deductible since medication is non-formuarly. Price may stay the same throughout the year for refills.  Notified patient and he confirmed price was okay and he will pick up.  Follow Up: Instructed patient to reach back out if this becomes unaffordable or other issues arise  Sherrill Raring, PharmD Clinical Pharmacist [phone]

## 2024-03-29 NOTE — Telephone Encounter (Signed)
 Pharmacy Patient Advocate Encounter  Received notification from [employer] that Prior Authorization for SAVAYSA 60 MG TABS tablet  has been APPROVED from 12/28/2023 to 12/26/2024   PLEASE BE ADVISED APPROVAL LETTER HAS BEEN SCANNED IN MEDIA OF CHART

## 2024-03-29 NOTE — Telephone Encounter (Signed)
 Spoke with patient (see phone encounter from 03/29/24)

## 2024-03-29 NOTE — Telephone Encounter (Signed)
 Left a detailed message with the approval information below on the pharmacy voicemail at [facility].

## 2024-03-30 ENCOUNTER — Other Ambulatory Visit: Payer: Self-pay | Admitting: Cardiology

## 2024-03-30 ENCOUNTER — Other Ambulatory Visit: Payer: Self-pay | Admitting: Family Medicine

## 2024-03-30 DIAGNOSIS — I10 Essential (primary) hypertension: Secondary | ICD-10-CM

## 2024-03-30 DIAGNOSIS — E785 Hyperlipidemia, unspecified: Secondary | ICD-10-CM

## 2024-04-17 DIAGNOSIS — L218 Other seborrheic dermatitis: Secondary | ICD-10-CM | POA: Diagnosis not present

## 2024-04-17 DIAGNOSIS — L814 Other melanin hyperpigmentation: Secondary | ICD-10-CM | POA: Diagnosis not present

## 2024-04-17 DIAGNOSIS — D492 Neoplasm of unspecified behavior of bone, soft tissue, and skin: Secondary | ICD-10-CM | POA: Diagnosis not present

## 2024-04-17 DIAGNOSIS — L570 Actinic keratosis: Secondary | ICD-10-CM | POA: Diagnosis not present

## 2024-04-17 DIAGNOSIS — D0461 Carcinoma in situ of skin of right upper limb, including shoulder: Secondary | ICD-10-CM | POA: Diagnosis not present

## 2024-04-17 DIAGNOSIS — L821 Other seborrheic keratosis: Secondary | ICD-10-CM | POA: Diagnosis not present

## 2024-04-17 DIAGNOSIS — D225 Melanocytic nevi of trunk: Secondary | ICD-10-CM | POA: Diagnosis not present

## 2024-05-29 DIAGNOSIS — D0461 Carcinoma in situ of skin of right upper limb, including shoulder: Secondary | ICD-10-CM | POA: Diagnosis not present

## 2024-06-19 DIAGNOSIS — D0461 Carcinoma in situ of skin of right upper limb, including shoulder: Secondary | ICD-10-CM | POA: Diagnosis not present

## 2024-06-27 DIAGNOSIS — L905 Scar conditions and fibrosis of skin: Secondary | ICD-10-CM | POA: Diagnosis not present

## 2024-06-27 DIAGNOSIS — D0461 Carcinoma in situ of skin of right upper limb, including shoulder: Secondary | ICD-10-CM | POA: Diagnosis not present

## 2024-07-20 ENCOUNTER — Telehealth: Payer: Self-pay

## 2024-07-20 NOTE — Telephone Encounter (Signed)
 Unsuccessful attempts to reach patient on preferred number listed in notes for scheduled AWV. Left message on voicemail okay to reschedule.

## 2024-09-11 ENCOUNTER — Telehealth: Payer: Self-pay

## 2024-09-11 NOTE — Telephone Encounter (Signed)
 Copied from CRM [id number]. Topic: Appointments - Appointment Scheduling >> Sep 11, 2024 11:01 AM Rosina BIRCH wrote: Patient/patient representative is calling to schedule an appointment. Refer to attachments for appointment information. >> Sep 11, 2024 11:04 AM Rosina BIRCH wrote: Patient want to know if he need to get a covid booster

## 2024-09-13 NOTE — Telephone Encounter (Signed)
 Ok to schedule pt if not already done, yes he should get a covid booster.

## 2024-09-14 MED ORDER — COVID-19 MRNA VACC (MODERNA) 50 MCG/0.5ML IM SUSY: 0.5000 mL | PREFILLED_SYRINGE | Freq: Once | INTRAMUSCULAR | 0 refills | Status: AC

## 2024-09-14 NOTE — Addendum Note (Signed)
 Addended by: CHRISTYNE AMBLE A on: 09/14/2024 02:00 PM   Modules accepted: Orders

## 2024-09-14 NOTE — Telephone Encounter (Signed)
 Patient informed of the message below and stated he already has an appt scheduled on 12/9.  Rx for the vaccine was sent to [facility].

## 2024-09-15 ENCOUNTER — Other Ambulatory Visit: Payer: Self-pay | Admitting: Family Medicine

## 2024-09-15 DIAGNOSIS — G250 Essential tremor: Secondary | ICD-10-CM

## 2024-09-23 ENCOUNTER — Other Ambulatory Visit: Payer: Self-pay | Admitting: Family Medicine

## 2024-09-23 ENCOUNTER — Other Ambulatory Visit: Payer: Self-pay | Admitting: Cardiology

## 2024-09-23 DIAGNOSIS — I10 Essential (primary) hypertension: Secondary | ICD-10-CM

## 2024-09-23 DIAGNOSIS — E785 Hyperlipidemia, unspecified: Secondary | ICD-10-CM

## 2024-10-01 DIAGNOSIS — D1801 Hemangioma of skin and subcutaneous tissue: Secondary | ICD-10-CM | POA: Diagnosis not present

## 2024-10-01 DIAGNOSIS — L814 Other melanin hyperpigmentation: Secondary | ICD-10-CM | POA: Diagnosis not present

## 2024-10-01 DIAGNOSIS — L821 Other seborrheic keratosis: Secondary | ICD-10-CM | POA: Diagnosis not present

## 2024-10-01 DIAGNOSIS — D492 Neoplasm of unspecified behavior of bone, soft tissue, and skin: Secondary | ICD-10-CM | POA: Diagnosis not present

## 2024-10-01 DIAGNOSIS — C44519 Basal cell carcinoma of skin of other part of trunk: Secondary | ICD-10-CM | POA: Diagnosis not present

## 2024-10-01 DIAGNOSIS — C44529 Squamous cell carcinoma of skin of other part of trunk: Secondary | ICD-10-CM | POA: Diagnosis not present

## 2024-10-11 NOTE — Progress Notes (Addendum)
 Matthew Trujillo                                          MRN: [id number]   10/11/2024   The [facility] Quality Team Specialist reviewed this patient medical record for the purposes of chart review for care gap closure. The following were reviewed: chart review for care gap closure-controlling blood pressure.  12/17/2024- Abstracted CBP    [facility] Quality Team

## 2024-10-18 DIAGNOSIS — K029 Dental caries, unspecified: Secondary | ICD-10-CM | POA: Diagnosis not present

## 2024-10-23 DIAGNOSIS — Z0120 Encounter for dental examination and cleaning without abnormal findings: Secondary | ICD-10-CM | POA: Diagnosis not present

## 2024-11-10 ENCOUNTER — Other Ambulatory Visit: Payer: Self-pay | Admitting: Family Medicine

## 2024-11-10 DIAGNOSIS — E0590 Thyrotoxicosis, unspecified without thyrotoxic crisis or storm: Secondary | ICD-10-CM

## 2024-11-12 ENCOUNTER — Other Ambulatory Visit: Payer: Self-pay | Admitting: Family Medicine

## 2024-11-12 DIAGNOSIS — J45909 Unspecified asthma, uncomplicated: Secondary | ICD-10-CM

## 2024-11-20 DIAGNOSIS — C44519 Basal cell carcinoma of skin of other part of trunk: Secondary | ICD-10-CM | POA: Diagnosis not present

## 2024-11-30 ENCOUNTER — Encounter: Payer: Self-pay | Admitting: Family Medicine

## 2024-12-04 ENCOUNTER — Encounter: Admitting: Family Medicine

## 2024-12-05 ENCOUNTER — Encounter: Payer: Self-pay | Admitting: Family Medicine

## 2024-12-05 ENCOUNTER — Ambulatory Visit: Admitting: Family Medicine

## 2024-12-05 VITALS — BP 120/60 | HR 70 | Temp 98.1°F | Ht 73.0 in | Wt 237.3 lb

## 2024-12-05 DIAGNOSIS — G250 Essential tremor: Secondary | ICD-10-CM | POA: Diagnosis not present

## 2024-12-05 DIAGNOSIS — R350 Frequency of micturition: Secondary | ICD-10-CM

## 2024-12-05 DIAGNOSIS — Z125 Encounter for screening for malignant neoplasm of prostate: Secondary | ICD-10-CM

## 2024-12-05 DIAGNOSIS — G4725 Circadian rhythm sleep disorder, jet lag type: Secondary | ICD-10-CM

## 2024-12-05 DIAGNOSIS — Z131 Encounter for screening for diabetes mellitus: Secondary | ICD-10-CM | POA: Diagnosis not present

## 2024-12-05 DIAGNOSIS — E0590 Thyrotoxicosis, unspecified without thyrotoxic crisis or storm: Secondary | ICD-10-CM

## 2024-12-05 DIAGNOSIS — E785 Hyperlipidemia, unspecified: Secondary | ICD-10-CM

## 2024-12-05 DIAGNOSIS — D6851 Activated protein C resistance: Secondary | ICD-10-CM | POA: Diagnosis not present

## 2024-12-05 DIAGNOSIS — Z0000 Encounter for general adult medical examination without abnormal findings: Secondary | ICD-10-CM | POA: Diagnosis not present

## 2024-12-05 LAB — LIPID PANEL
Cholesterol: 147 mg/dL (ref 0–200)
HDL: 40.6 mg/dL (ref >39.00)
LDL Cholesterol: 76 mg/dL (ref 0–99)
NonHDL: 106.72
Total CHOL/HDL Ratio: 4
Triglycerides: 155 mg/dL — ABNORMAL HIGH (ref 0.0–149.0)
VLDL: 31 mg/dL (ref 0.0–40.0)

## 2024-12-05 LAB — CBC WITH DIFFERENTIAL/PLATELET
Basophils Absolute: 0 K/uL (ref 0.0–0.1)
Basophils Relative: 0.4 % (ref 0.0–3.0)
Eosinophils Absolute: 0.1 K/uL (ref 0.0–0.7)
Eosinophils Relative: 0.9 % (ref 0.0–5.0)
HCT: 47.1 % (ref 39.0–52.0)
Hemoglobin: 16.2 g/dL (ref 13.0–17.0)
Lymphocytes Relative: 40.3 % (ref 12.0–46.0)
Lymphs Abs: 4.3 K/uL — ABNORMAL HIGH (ref 0.7–4.0)
MCHC: 34.4 g/dL (ref 30.0–36.0)
MCV: 89.2 fl (ref 78.0–100.0)
Monocytes Absolute: 0.8 K/uL (ref 0.1–1.0)
Monocytes Relative: 7.7 % (ref 3.0–12.0)
Neutro Abs: 5.4 K/uL (ref 1.4–7.7)
Neutrophils Relative %: 50.7 % (ref 43.0–77.0)
Platelets: 320 K/uL (ref 150.0–400.0)
RBC: 5.28 Mil/uL (ref 4.22–5.81)
RDW: 14.3 % (ref 11.5–15.5)
WBC: 10.7 K/uL — ABNORMAL HIGH (ref 4.0–10.5)

## 2024-12-05 LAB — COMPREHENSIVE METABOLIC PANEL WITH GFR
ALT: 43 U/L (ref 0–53)
AST: 30 U/L (ref 0–37)
Albumin: 4.9 g/dL (ref 3.5–5.2)
Alkaline Phosphatase: 49 U/L (ref 39–117)
BUN: 19 mg/dL (ref 6–23)
CO2: 26 meq/L (ref 19–32)
Calcium: 10.3 mg/dL (ref 8.4–10.5)
Chloride: 99 meq/L (ref 96–112)
Creatinine, Ser: 0.82 mg/dL (ref 0.40–1.50)
GFR: 88.41 mL/min (ref >60.00)
Glucose, Bld: 92 mg/dL (ref 70–99)
Potassium: 4.2 meq/L (ref 3.5–5.1)
Sodium: 136 meq/L (ref 135–145)
Total Bilirubin: 1 mg/dL (ref 0.2–1.2)
Total Protein: 7.2 g/dL (ref 6.0–8.3)

## 2024-12-05 LAB — PSA, MEDICARE: PSA: 0.63 ng/mL (ref 0.10–4.00)

## 2024-12-05 LAB — HEMOGLOBIN A1C: Hgb A1c MFr Bld: 5.5 % (ref 4.6–6.5)

## 2024-12-05 LAB — TSH: TSH: 2.38 u[IU]/mL (ref 0.35–5.50)

## 2024-12-05 MED ORDER — ROSUVASTATIN CALCIUM 5 MG PO TABS: 5.0000 mg | ORAL_TABLET | Freq: Every day | ORAL | 1 refills | Status: AC

## 2024-12-05 MED ORDER — ZOLPIDEM TARTRATE 5 MG PO TABS: 5.0000 mg | ORAL_TABLET | Freq: Every evening | ORAL | 0 refills | Status: AC | PRN

## 2024-12-05 MED ORDER — METHIMAZOLE 5 MG PO TABS: 5.0000 mg | ORAL_TABLET | Freq: Every day | ORAL | 1 refills | Status: AC

## 2024-12-05 MED ORDER — PRIMIDONE 50 MG PO TABS: 50.0000 mg | ORAL_TABLET | Freq: Two times a day (BID) | ORAL | 1 refills | Status: AC

## 2024-12-05 NOTE — Progress Notes (Signed)
 Chief Complaint  Patient presents with   Medicare Wellness     Subjective:   Matthew Trujillo is a 73 y.o. male who presents for a Medicare Annual Wellness Visit. Patient is here for his medicare wellness and annual physical. He reports that he needs the Savaysa  because it does not interact with the primidone  that he is taking for his essential tremor, other NOACS interfere with primidone  however Savasya does not.   Patient is reporting an increase in urinary frequency- states he is going to the bathroom more frequently and he feels like he is not emptying his bladder when he goes, doesn't have to push or strain, is dribbling at night.    Visit info / Clinical Intake: Medicare Wellness Visit Type:: Subsequent Annual Wellness Visit Persons participating in visit and providing information:: patient Medicare Wellness Visit Mode:: In-person (required for WTM) Interpreter Needed?: No Pre-visit prep was completed: no AWV questionnaire completed by patient prior to visit?: no Living arrangements:: lives with spouse/significant other Patient's Overall Health Status Rating: good Typical amount of pain: none Does pain affect daily life?: no Are you currently prescribed opioids?: no  Dietary Habits and Nutritional Risks How many meals a day?: 3 Eats fruit and vegetables daily?: yes Most meals are obtained by: preparing own meals; eating out In the last 2 weeks, have you had any of the following?: none Diabetic:: no  Functional Status Activities of Daily Living (to include ambulation/medication): Independent Ambulation: Independent Medication Administration: Independent Home Management (perform basic housework or laundry): Independent Manage your own finances?: yes Primary transportation is: driving Concerns about vision?: no *vision screening is required for WTM* Concerns about hearing?: no  Fall Screening Falls in the past year?: 0 Number of falls in past year: 0 Was there an  injury with Fall?: 0 Fall Risk Category Calculator: 0 Patient Fall Risk Level: Low Fall Risk  Fall Risk Patient at Risk for Falls Due to: No Fall Risks  Home and Transportation Safety: All rugs have non-skid backing?: yes All stairs or steps have railings?: yes Grab bars in the bathtub or shower?: (!) no Have non-skid surface in bathtub or shower?: (!) no Good home lighting?: yes Regular seat belt use?: yes Hospital stays in the last year:: no  Cognitive Assessment Difficulty concentrating, remembering, or making decisions? : no Will 6CIT or Mini Cog be Completed: yes What year is it?: 0 points What month is it?: 0 points Give patient an address phrase to remember (5 components): [address] [employer] About what time is it?: 0 points Count backwards from 20 to 1: 0 points Say the months of the year in reverse: 0 points Repeat the address phrase from earlier: 2 points 6 CIT Score: 2 points  Advance Directives (For Healthcare) Does Patient Have a Medical Advance Directive?: Yes Does patient want to make changes to medical advance directive?: No - Patient declined Type of Advance Directive: Living will Copy of Living Will in Chart?: No - copy requested  Reviewed/Updated  Reviewed/Updated: Reviewed All (Medical, Surgical, Family, Medications, Allergies, Care Teams, Patient Goals)    Allergies (verified) Celebrex [celecoxib], Neosporin [bacitracin-polymyxin b], and Other   Current Medications (verified) Outpatient Encounter Medications as of 12/05/2024  Medication Sig   albuterol  (PROVENTIL ) (2.5 MG/3ML) 0.083% nebulizer solution Take 3 mLs (2.5 mg total) by nebulization every 6 (six) hours as needed for wheezing or shortness of breath.   albuterol  (VENTOLIN  HFA) 108 (90 Base) MCG/ACT inhaler INHALE 2 PUFFS INTO THE LUNGS EVERY  4 HOURS AS NEEDED FOR WHEEZE   amLODipine  (NORVASC ) 10 MG tablet TAKE 1 TABLET BY MOUTH EVERY DAY   budesonide -formoterol  (SYMBICORT) 160-4.5  MCG/ACT inhaler INHALE 2 PUFFS INTO THE LUNGS TWICE A DAY   Calcium  Carbonate-Vitamin D 600-400 MG-UNIT tablet Take 1 tablet by mouth daily.   chlorthalidone  (HYGROTON ) 50 MG tablet TAKE 1 TABLET BY MOUTH EVERY DAY   Cholecalciferol (VITAMIN D-3) 5000 units TABS Take 5,000 Units by mouth daily.   Multiple Vitamin (MULTIVITAMIN) tablet Take 1 tablet by mouth daily.   Omega-3 Fatty Acids (FISH OIL) 1000 MG CAPS Take by mouth 2 (two) times daily.   ramipril  (ALTACE ) 10 MG capsule TAKE 1 CAPSULE BY MOUTH EVERY DAY   SAVAYSA  60 MG TABS tablet TAKE 60 MG BY MOUTH DAILY.   Spacer/Aero-Holding Chambers (E-Z SPACER) inhaler Use as instructed   zolpidem (AMBIEN) 5 MG tablet Take 1 tablet (5 mg total) by mouth at bedtime as needed for sleep.   [DISCONTINUED] methimazole  (TAPAZOLE ) 5 MG tablet TAKE 1 TABLET (5 MG TOTAL) BY MOUTH DAILY.   [DISCONTINUED] primidone  (MYSOLINE ) 50 MG tablet TAKE 1 TABLET (50 MG) BY MOUTH IN THE MORNING AND AT BEDTIME   [DISCONTINUED] rosuvastatin  (CRESTOR ) 5 MG tablet TAKE 1 TABLET (5 MG TOTAL) BY MOUTH DAILY.   methimazole  (TAPAZOLE ) 5 MG tablet Take 1 tablet (5 mg total) by mouth daily.   primidone  (MYSOLINE ) 50 MG tablet Take 1 tablet (50 mg total) by mouth in the morning and at bedtime.   rosuvastatin  (CRESTOR ) 5 MG tablet Take 1 tablet (5 mg total) by mouth daily.   [DISCONTINUED] guaiFENesin -codeine  100-10 MG/5ML syrup Take 10 mLs by mouth every 6 (six) hours as needed for cough.   No facility-administered encounter medications on file as of 12/05/2024.    History: Past Medical History:  Diagnosis Date   ALLERGIC RHINITIS 05/21/2010   Allergy    ASTHMA 05/21/2010   Asthma    Factor V Leiden    Hx of adenomatous colonic polyps    Hyperlipidemia    HYPERTENSION 05/21/2010   Diffiult to control   Hyperthyroidism    Inguinal hernia    Ischemic colitis    Lower GI bleed    Prolonged QT interval    PULMONARY EMBOLISM 05/21/2010   Umbilical hernia    Past  Surgical History:  Procedure Laterality Date   ACHILLES TENDON REPAIR     COLONOSCOPY     last 2019, multiple   DOPPLER ECHOCARDIOGRAPHY  12/30/2011   EF >55%; MILD LVH,MILD LAE,NORMAL DIASTOLIC FUNCTION   RENAL DOPPLER  12/30/2011   NORMAL RENAL   UMBILICAL HERNIA REPAIR N/A 01/04/2020   Procedure: UMBILICAL HERNIA REPAIR WITH MESH;  Surgeon: Curvin Deward MOULD, MD;  Location: [facility];  Service: General;  Laterality: N/A;   Family History  Problem Relation Age of Onset   Kidney failure Father        single kidney; hx of recurrent infections   Renal cancer Father    Pancreatic cancer Brother    Hypertension Mother    Hypothyroidism Mother    Hypothyroidism Sister    Heart disease Neg Hx    Colon cancer Neg Hx    Esophageal cancer Neg Hx    Stomach cancer Neg Hx    Rectal cancer Neg Hx    Social History   Occupational History   Not on file  Tobacco Use   Smoking status: Never   Smokeless tobacco: Never  Vaping Use  Vaping status: Never Used  Substance and Sexual Activity   Alcohol use: Yes    Comment: 2-3 times/week   Drug use: No   Sexual activity: Not on file   Tobacco Counseling Counseling given: Not Answered  SDOH Screenings   Food Insecurity: No Food Insecurity (12/04/2024)  Housing: Low Risk  (12/04/2024)  Transportation Needs: No Transportation Needs (12/04/2024)  Utilities: Not At Risk (12/05/2024)  Alcohol Screen: Low Risk  (12/04/2024)  Depression (PHQ2-9): Low Risk  (12/05/2024)  Financial Resource Strain: Low Risk  (12/04/2024)  Physical Activity: Insufficiently Active (12/04/2024)  Social Connections: Socially Isolated (12/04/2024)  Stress: No Stress Concern Present (12/04/2024)  Tobacco Use: Low Risk  (12/05/2024)  Health Literacy: Adequate Health Literacy (12/05/2024)   See flowsheets for full screening details  Depression Screen PHQ 2 & 9 Depression Scale- Over the past 2 weeks, how often have you been bothered by any of the following  problems? Little interest or pleasure in doing things: 0 Feeling down, depressed, or hopeless (PHQ Adolescent also includes...irritable): 1 PHQ-2 Total Score: 1     Goals Addressed   None     Review of Systems  HENT:  Negative for hearing loss.   Eyes:  Negative for blurred vision.  Respiratory:  Negative for shortness of breath.   Cardiovascular:  Negative for chest pain.  Gastrointestinal: Negative.   Genitourinary:  Positive for frequency.  Musculoskeletal:  Negative for back pain.  Neurological:  Negative for headaches.  Psychiatric/Behavioral:  Negative for depression.   All other systems reviewed and are negative.   Physical Exam Vitals reviewed.  Constitutional:      Appearance: Normal appearance. He is obese.  HENT:     Right Ear: Tympanic membrane normal.     Left Ear: Tympanic membrane normal.     Nose: Nose normal.     Mouth/Throat:     Mouth: Mucous membranes are moist.     Pharynx: No posterior oropharyngeal erythema.  Eyes:     Conjunctiva/sclera: Conjunctivae normal.     Pupils: Pupils are equal, round, and reactive to light.  Neck:     Thyroid : No thyromegaly.  Cardiovascular:     Rate and Rhythm: Normal rate and regular rhythm.     Pulses: Normal pulses.     Heart sounds: Normal heart sounds.  Pulmonary:     Effort: Pulmonary effort is normal.     Breath sounds: Normal breath sounds. No wheezing.  Abdominal:     General: Bowel sounds are normal. There is no distension.     Palpations: Abdomen is soft.     Tenderness: There is no abdominal tenderness.  Musculoskeletal:     Right lower leg: No edema.     Left lower leg: No edema.  Lymphadenopathy:     Cervical: No cervical adenopathy.  Neurological:     Mental Status: He is alert and oriented to person, place, and time. Mental status is at baseline.  Psychiatric:        Mood and Affect: Mood normal.        Behavior: Behavior normal.        Objective:    Today's Vitals   12/05/24 0836   BP: 120/60  Pulse: 70  Temp: 98.1 F (36.7 C)  TempSrc: Oral  SpO2: 100%  Weight: 237 lb 4.8 oz (107.6 kg)  Height: 6' 1 (1.854 m)   Body mass index is 31.31 kg/m.  Hearing/Vision screen No results found. Immunizations and Health Maintenance Health Maintenance  Topic Date Due   COVID-19 Vaccine (10 - Pfizer risk 2025-26 season) 02/24/2025   Medicare Annual Wellness (AWV)  12/05/2025   DTaP/Tdap/Td (2 - Td or Tdap) 09/14/2029   Pneumococcal Vaccine: 50+ Years  Completed   Influenza Vaccine  Completed   Hepatitis C Screening  Completed   Zoster Vaccines- Shingrix  Completed   Meningococcal B Vaccine  Aged Out   Colonoscopy  Discontinued        Assessment/Plan:  This is a routine wellness examination for [facility].  Patient Care Team: Ozell Heron HERO, MD as PCP - General (Family Medicine) Anner Alm ORN, MD as PCP - Cardiology (Cardiology) Tat, Asberry RAMAN, DO as Consulting Physician (Neurology)  I have personally reviewed and noted the following in the patients chart:   Medical and social history Use of alcohol, tobacco or illicit drugs  Current medications and supplements including opioid prescriptions. Functional ability and status Nutritional status Physical activity Advanced directives List of other physicians Hospitalizations, surgeries, and ER visits in previous 12 months Vitals Screenings to include cognitive, depression, and falls Referrals and appointments Problem List Items Addressed This Visit       Unprioritized   Factor V Leiden   Relevant Orders   On Savaysa  indefinitely, pt requires this medication due to also being on primidone  for his tremor.   CBC with Differential/Platelet   Hyperthyroidism   Relevant Medications   methimazole  (TAPAZOLE ) 5 MG tablet   Other Relevant Orders   CMP   TSH   Hyperlipidemia with target LDL less than 100 (Chronic)   Relevant Medications   Refilled crestor , needs new lipid panel today for surveillance.    rosuvastatin  (CRESTOR ) 5 MG tablet   Other Relevant Orders   Lipid panel   Other Visit Diagnoses       Encounter for Medicare annual wellness exam    -  Primary      Prostate cancer screening       Relevant Orders   PSA, Medicare     Diabetes mellitus screening       Relevant Orders   Hemoglobin A1c     Essential tremor       Relevant Medications   Pt reports his symptoms are stable, thinks maybe he needs a higher dose of the primidone . Is seeing his neurologist next month and will discuss with her.  primidone  (MYSOLINE ) 50 MG tablet     Urinary frequency       Relevant Orders   Pt is reporting this new symptom today as well, checking PSA and placing referral for urology for full evaluation.  Ambulatory referral to Urology     Jet lag       Relevant Medications   Pt is going to travel internationally and is asking for a few tablets of ambien to help adjust with the time change. He reports otherwise no trouble sleeping, will give a small 7 day supply only, risks/benefits of ambien were discussed with patient.   zolpidem (AMBIEN) 5 MG tablet     Routine general medical examination at a health care facility      General physical exam findings are normal today. I reviewed the patient's preventative testing, immunizations, and lifestyle habits. I made appropriate recommendations and placed orders for the appropriate tests and/or vaccinations. I counseled the patient on the CDC's recommendations for healthy exercise and diet. I counseled the patient on healthy sleep habits and stress management. Handouts to reinforce the counseling were given at the conclusion of  the visit.        In addition, I have reviewed and discussed with patient certain preventive protocols, quality metrics, and best practice recommendations. A written personalized care plan for preventive services as well as general preventive health recommendations were provided to patient.   Heron CHRISTELLA Sharper,  MD   12/05/2024   Return in 1 year (on 12/05/2025) for AWV.  After Visit Summary: (In Person-Printed) AVS printed and given to the patient  Nurse Notes: N/A

## 2024-12-07 ENCOUNTER — Ambulatory Visit: Attending: Cardiology | Admitting: Cardiology

## 2024-12-07 ENCOUNTER — Ambulatory Visit: Payer: Self-pay | Admitting: Family Medicine

## 2024-12-07 ENCOUNTER — Encounter: Payer: Self-pay | Admitting: Cardiology

## 2024-12-07 VITALS — BP 136/70 | HR 68 | Ht 73.0 in | Wt 236.0 lb

## 2024-12-07 DIAGNOSIS — I82402 Acute embolism and thrombosis of unspecified deep veins of left lower extremity: Secondary | ICD-10-CM | POA: Diagnosis not present

## 2024-12-07 DIAGNOSIS — I10 Essential (primary) hypertension: Secondary | ICD-10-CM | POA: Diagnosis not present

## 2024-12-07 DIAGNOSIS — Z7901 Long term (current) use of anticoagulants: Secondary | ICD-10-CM | POA: Diagnosis not present

## 2024-12-07 DIAGNOSIS — J45901 Unspecified asthma with (acute) exacerbation: Secondary | ICD-10-CM | POA: Diagnosis not present

## 2024-12-07 DIAGNOSIS — G250 Essential tremor: Secondary | ICD-10-CM | POA: Diagnosis not present

## 2024-12-07 DIAGNOSIS — R002 Palpitations: Secondary | ICD-10-CM

## 2024-12-07 DIAGNOSIS — E785 Hyperlipidemia, unspecified: Secondary | ICD-10-CM | POA: Diagnosis not present

## 2024-12-07 DIAGNOSIS — E0500 Thyrotoxicosis with diffuse goiter without thyrotoxic crisis or storm: Secondary | ICD-10-CM | POA: Diagnosis not present

## 2024-12-07 NOTE — Patient Instructions (Signed)
 Medication Instructions:   No changes  *If you need a refill on your cardiac medications before your next appointment, please call your pharmacy*   Lab Work: Not needed    Testing/Procedures:  Not needed  Follow-Up: At [facility], you and your health needs are our priority.  As part of our continuing mission to provide you with exceptional heart care, we have created designated Provider Care Teams.  These Care Teams include your primary Cardiologist (physician) and Advanced Practice Providers (APPs -  Physician Assistants and Nurse Practitioners) who all work together to provide you with the care you need, when you need it.     Your next appointment:   12 month(s)  The format for your next appointment:   In Person  Provider:   Josefa Beauvais, NP, Lum Louis, NP, or Lamarr Satterfield, NP      Then, Alm Clay, MD will plan to see you again in 24 month(s).

## 2024-12-07 NOTE — Progress Notes (Signed)
 Cardiology Office Note:  .   Date:  12/09/2024  ID:  Matthew Trujillo [place], DOB [birth year], MRN [id number] PCP: Matthew Trujillo HERO, MD  [facility] Providers Cardiologist:  Matthew Clay, MD     Chief Complaint  Patient presents with   Follow-up    6-month follow-up.  I have not seen him in over 2-1/2 years.   Hypertension    Blood pressure little higher than baseline but today is higher than usual.    Patient Profile: .     Matthew Trujillo is a relatively healthy 73 y.o. male with a PMH notable below who presents here for management of blood pressure at the request of Matthew Trujillo HERO, MD.  Past Medical History/Medications - Hypertension: On amlodipine  10 mg daily, chlorthalidone  50 mg daily, ramipril  10 mg daily. 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  COMMODORE BELLEW was last seen on February 03, 2024 by Matthew Satterfield, NP has a 2-year follow-up concerned about his blood pressures increasing from an average of 120s/60s to 130s/70s.  He also noticed significant family stress with both his wife and daughter-in-law having cancer and his wife's cancer being fairly severe.  Otherwise asymptomatic from a cardiac standpoint.  She recommended just monitoring his blood pressures and follow-up in 6 months.  Subjective  Discussed the use of AI scribe software for clinical note transcription with the patient, who gave verbal consent to proceed.  History of Present Illness Matthew Trujillo is a 73 year old male with hypertension who presents for follow-up of his blood pressure  management.  His blood pressure has been fluctuating, with a recent reading of 120/60 mmHg two days ago, but it has been higher at other times. He attributes some of the fluctuations to stress, mentioning a stressful year due to his daughter-in-law and wife having cancer. He is currently on amlodipine  10 mg, chlorthalidone  50 mg, and ramipril  10 mg for blood pressure management.  Cardiovascular ROS: no chest pain or dyspnea on exertion positive for - palpitations and he notes that blood pressures been going up [place] but otherwise has not had any significant symptoms.  Just do think that the blood pressure may be related to stress. negative for - edema, irregular heartbeat, orthopnea, paroxysmal nocturnal dyspnea, rapid heart rate, shortness of breath, or lightheadedness or dizziness [place], syncope/near syncope or TIA/amaurosis fugax, claudication.  He has had two DVTs, seven years apart, and was initially on warfarin before switching to Eliquis  and then Savaysa .He previously used Eliquis  but switched due to interactions with primidone , which he takes for tremors associated with Graves' disease.   Graves' disease was diagnosed several years ago. He experienced tremors as a symptom, which persist despite treatment. He is on methimazole  for Graves' disease and primidone  for tremors. No recent palpitations since the management of Graves' disease.  He has asthma and uses Advair  as a maintenance inhaler, taking two puffs twice a day, but adjusts the dose based on the season. He uses albuterol  as needed, though infrequently, and often discards it when expired.  His cholesterol is managed with Crestor , fiber, and  fish oil. His LDL has improved from 130s to 76, and he gets his cholesterol checked annually by his primary care physician.  He mentions a history of weight loss, noting he weighed 236 lbs today, down from 240 lbs previously. He recalls weighing 233 lbs in March.    Objective   Studies  Reviewed: SABRA   EKG Interpretation Date/Time:  Friday December 07 2024 09:47:23 EST Ventricular Rate:  68 PR Interval:  190 QRS Duration:  92 QT Interval:  418 QTC Calculation: 444 R Axis:   57  Text Interpretation: Normal sinus rhythm Nonspecific T wave abnormality When compared with ECG of 03-Feb-2024 10:21, No significant change was found Confirmed by Anner Lenis ([id number]) on 12/07/2024 10:13:03 AM     Labs from 12/02/2023: TC 150, TG 140, HDL 38, LDL 84.  Glucose 98, Creatinine 0.92, Potassium 3.8.  TSH 3.52. Labs ordered for today 2 days ago.  Reviewed. Lab Results  Component Value Date   CHOL 147 12/05/2024   HDL 40.60 12/05/2024   LDLCALC 76 12/05/2024   TRIG 155.0 (H) 12/05/2024   CHOLHDL 4 12/05/2024   Lab Results  Component Value Date   NA 136 12/05/2024   CL 99 12/05/2024   K 4.2 12/05/2024   CO2 26 12/05/2024   BUN 19 12/05/2024   CREATININE 0.82 12/05/2024   GFR 88.41 12/05/2024   CALCIUM  10.3 12/05/2024   ALBUMIN 4.9 12/05/2024   GLUCOSE 92 12/05/2024   Lab Results  Component Value Date   HGBA1C 5.5 12/05/2024   Results LABS Creatinine: GFR > 60 Calcium : stable Albumin: stable PSA: stable Protein: stable LDL: 76 No new studies   Risk Assessment/Calculations:            Physical Exam:   VS: Initial BP 142/72.  Repeat BP 136/70   Pulse 68   Ht 6' 1 (1.854 m)   Wt 236 lb (107 kg)   SpO2 97%   BMI 31.14 kg/m    Wt Readings from Last 3 Encounters:  12/07/24 236 lb (107 kg)  12/05/24 237 lb 4.8 oz (107.6 kg)  03/05/24 233 lb (105.7 kg)     GEN: Well nourished, well groomed; in no acute distress; mildly obese but otherwise healthy appearing NECK: No JVD; No carotid bruits CARDIAC: Normal S1, S2; RRR, no murmurs, rubs, gallops RESPIRATORY:  Clear to auscultation without rales, wheezing or rhonchi ; nonlabored, good air movement. ABDOMEN: Soft, non-tender, non-distended EXTREMITIES:  No edema; No deformity      ASSESSMENT AND PLAN: .     Problem List Items Addressed This Visit       Cardiology Problems   Essential hypertension - Primary (Chronic)   Blood pressure fluctuating due to stress and activities. Current medications at maximum doses. Well-controlled at last visit. No need for additional medication unless consistently over 140/90 mmHg. He is on max dose of current medications member required addition of another medicine if additional control is required, which I am leery to start. - Continue current antihypertensive regimen: Amlodipine  10 mg daily, ramipril  10 mg, chlorthalidone  50 mg daily. - Monitor blood pressure regularly. - Contact provider if blood pressure consistently exceeds 135/80 mmHg. ==> Next towards medication probably be a beta-1 selective beta-blocker versus hydralazine.      Relevant Orders   EKG 12-Lead (Completed)   Hyperlipidemia with target LDL less than 100 (Chronic)   LDL cholesterol at 76 mg/dL, improved.  Low-dose Crestor  effective.   No significant coronary artery disease documented, LDL level  acceptable. - Continue Crestor  5 mg daily for hyperlipidemia management.      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 Previous issues with Eliquis . Savaysa  well-tolerated and effective. - Continue Savaysa  60 mg daily for anticoagulation.  -Okay to hold Savaysa  2 to 3 days preop for surgeries or procedures.        Other   Asthma   Well-controlled with Symbicort. Uses albuterol  as needed, primarily during hay fever season. No recent exacerbations. - Continue Symbicort as maintenance therapy. - Use albuterol  as needed for asthma symptoms.      Chronic anticoagulation (Chronic)   On Savaysa  for DVTs.  Okay to hold 2 to 3 days preop for surgical procedures.      Essential tremor (Chronic)   Managed with primidone , effective. Previous medications not well-tolerated. Neurologist involved. - Continue primidone  for  tremor management. - Switched from Xarelto/Eliquis  to Savaysa  to avoid interaction with primidone       Graves disease (Chronic)   Managed with methimazole . No recent palpitations or heart rhythm issues. No need for beta blockers. - Continue methimazole  for Graves' disease management.      Palpitations (Chronic)   Pretty much resolved since treatment of thyroid  disease.          Follow-Up: Return in about 1 year (around 12/07/2025) for 1 Yr Follow-up, Alternating annual follow-ups APP and MD. Josefa Beauvais, NP, Lum Louis, NP, or Matthew Satterfield, NP         Signed, Matthew MICAEL Clay, MD, MS Matthew Trujillo, M.D., M.S. Interventional Cardiologist  [facility] Pager # [phone]

## 2024-12-09 ENCOUNTER — Encounter: Payer: Self-pay | Admitting: Cardiology

## 2024-12-09 DIAGNOSIS — G250 Essential tremor: Secondary | ICD-10-CM | POA: Insufficient documentation

## 2024-12-09 NOTE — Assessment & Plan Note (Signed)
 Managed with methimazole . No recent palpitations or heart rhythm issues. No need for beta blockers. - Continue methimazole  for Graves' disease management.

## 2024-12-09 NOTE — Assessment & Plan Note (Signed)
 Well-controlled with Symbicort. Uses albuterol  as needed, primarily during hay fever season. No recent exacerbations. - Continue Symbicort as maintenance therapy. - Use albuterol  as needed for asthma symptoms.

## 2024-12-09 NOTE — Assessment & Plan Note (Signed)
 On Savaysa  for DVTs.  Okay to hold 2 to 3 days preop for surgical procedures.

## 2024-12-09 NOTE — Assessment & Plan Note (Signed)
 Pretty much resolved since treatment of thyroid  disease.

## 2024-12-09 NOTE — Assessment & Plan Note (Signed)
 Managed with primidone , effective. Previous medications not well-tolerated. Neurologist involved. - Continue primidone  for tremor management. - Switched from Xarelto/Eliquis  to Savaysa  to avoid interaction with primidone 

## 2024-12-09 NOTE — Assessment & Plan Note (Signed)
 Blood pressure fluctuating due to stress and activities. Current medications at maximum doses. Well-controlled at last visit. No need for additional medication unless consistently over 140/90 mmHg. He is on max dose of current medications member required addition of another medicine if additional control is required, which I am leery to start. - Continue current antihypertensive regimen: Amlodipine  10 mg daily, ramipril  10 mg, chlorthalidone  50 mg daily. - Monitor blood pressure regularly. - Contact provider if blood pressure consistently exceeds 135/80 mmHg. ==> Next towards medication probably be a beta-1 selective beta-blocker versus hydralazine.

## 2024-12-09 NOTE — Assessment & Plan Note (Signed)
 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 Previous issues with Eliquis . Savaysa  well-tolerated and effective. - Continue Savaysa  60 mg daily for anticoagulation.  -Okay to hold Savaysa  2 to 3 days preop for surgeries or procedures.

## 2024-12-09 NOTE — Assessment & Plan Note (Signed)
 LDL cholesterol at 76 mg/dL, improved.  Low-dose Crestor  effective.   No significant coronary artery disease documented, LDL level acceptable. - Continue Crestor  5 mg daily for hyperlipidemia management.

## 2024-12-11 DIAGNOSIS — R3912 Poor urinary stream: Secondary | ICD-10-CM | POA: Diagnosis not present

## 2024-12-11 DIAGNOSIS — R351 Nocturia: Secondary | ICD-10-CM | POA: Diagnosis not present

## 2024-12-11 DIAGNOSIS — R350 Frequency of micturition: Secondary | ICD-10-CM | POA: Diagnosis not present

## 2024-12-11 DIAGNOSIS — N401 Enlarged prostate with lower urinary tract symptoms: Secondary | ICD-10-CM | POA: Diagnosis not present

## 2024-12-11 DIAGNOSIS — R3121 Asymptomatic microscopic hematuria: Secondary | ICD-10-CM | POA: Diagnosis not present

## 2024-12-21 DIAGNOSIS — R519 Headache, unspecified: Secondary | ICD-10-CM | POA: Diagnosis not present

## 2024-12-21 DIAGNOSIS — R0981 Nasal congestion: Secondary | ICD-10-CM | POA: Diagnosis not present

## 2024-12-21 DIAGNOSIS — Z03818 Encounter for observation for suspected exposure to other biological agents ruled out: Secondary | ICD-10-CM | POA: Diagnosis not present

## 2024-12-21 DIAGNOSIS — R051 Acute cough: Secondary | ICD-10-CM | POA: Diagnosis not present

## 2024-12-21 DIAGNOSIS — R509 Fever, unspecified: Secondary | ICD-10-CM | POA: Diagnosis not present

## 2024-12-21 DIAGNOSIS — J029 Acute pharyngitis, unspecified: Secondary | ICD-10-CM | POA: Diagnosis not present

## 2024-12-21 DIAGNOSIS — J101 Influenza due to other identified influenza virus with other respiratory manifestations: Secondary | ICD-10-CM | POA: Diagnosis not present

## 2024-12-22 ENCOUNTER — Other Ambulatory Visit: Payer: Self-pay | Admitting: Family Medicine

## 2024-12-22 DIAGNOSIS — I10 Essential (primary) hypertension: Secondary | ICD-10-CM

## 2025-01-09 ENCOUNTER — Encounter: Payer: Self-pay | Admitting: Family Medicine
